# Patient Record
Sex: Female | Born: 1980
Health system: Southern US, Community
[De-identification: ages and names within clinical notes are randomized; demographics above are authoritative.]

## PROBLEM LIST (undated history)

## (undated) DIAGNOSIS — E88819 Insulin resistance, unspecified: Secondary | ICD-10-CM

## (undated) DIAGNOSIS — E2839 Other primary ovarian failure: Secondary | ICD-10-CM

## (undated) DIAGNOSIS — T7840XA Allergy, unspecified, initial encounter: Secondary | ICD-10-CM

## (undated) DIAGNOSIS — K900 Celiac disease: Secondary | ICD-10-CM

## (undated) DIAGNOSIS — E8881 Metabolic syndrome: Secondary | ICD-10-CM

## (undated) DIAGNOSIS — G4730 Sleep apnea, unspecified: Secondary | ICD-10-CM

## (undated) DIAGNOSIS — Q796 Ehlers-Danlos syndrome, unspecified: Secondary | ICD-10-CM

## (undated) DIAGNOSIS — F419 Anxiety disorder, unspecified: Secondary | ICD-10-CM

## (undated) DIAGNOSIS — F32A Depression, unspecified: Secondary | ICD-10-CM

## (undated) DIAGNOSIS — M797 Fibromyalgia: Secondary | ICD-10-CM

## (undated) DIAGNOSIS — M1990 Unspecified osteoarthritis, unspecified site: Secondary | ICD-10-CM

## (undated) DIAGNOSIS — E079 Disorder of thyroid, unspecified: Secondary | ICD-10-CM

## (undated) DIAGNOSIS — R011 Cardiac murmur, unspecified: Secondary | ICD-10-CM

## (undated) DIAGNOSIS — O039 Complete or unspecified spontaneous abortion without complication: Secondary | ICD-10-CM

## (undated) DIAGNOSIS — M533 Sacrococcygeal disorders, not elsewhere classified: Secondary | ICD-10-CM

## (undated) DIAGNOSIS — E282 Polycystic ovarian syndrome: Secondary | ICD-10-CM

## (undated) DIAGNOSIS — M50321 Other cervical disc degeneration at C4-C5 level: Secondary | ICD-10-CM

## (undated) DIAGNOSIS — R87619 Unspecified abnormal cytological findings in specimens from cervix uteri: Secondary | ICD-10-CM

## (undated) DIAGNOSIS — D689 Coagulation defect, unspecified: Secondary | ICD-10-CM

## (undated) DIAGNOSIS — J069 Acute upper respiratory infection, unspecified: Secondary | ICD-10-CM

## (undated) HISTORY — DX: Disorder of thyroid, unspecified: E07.9

## (undated) HISTORY — DX: Other cervical disc degeneration at C4-C5 level: M50.321

## (undated) HISTORY — DX: Acute upper respiratory infection, unspecified: J06.9

## (undated) HISTORY — PX: ADENOIDECTOMY: SUR15

## (undated) HISTORY — PX: FINGER SURGERY: SHX640

## (undated) HISTORY — PX: SPINE SURGERY: SHX786

## (undated) HISTORY — DX: Cardiac murmur, unspecified: R01.1

## (undated) HISTORY — DX: Celiac disease: K90.0

## (undated) HISTORY — DX: Allergy, unspecified, initial encounter: T78.40XA

## (undated) HISTORY — DX: Anxiety disorder, unspecified: F41.9

## (undated) HISTORY — DX: Unspecified abnormal cytological findings in specimens from cervix uteri: R87.619

## (undated) HISTORY — DX: Coagulation defect, unspecified: D68.9

## (undated) HISTORY — DX: Sacrococcygeal disorders, not elsewhere classified: M53.3

## (undated) HISTORY — DX: Complete or unspecified spontaneous abortion without complication: O03.9

## (undated) HISTORY — PX: WISDOM TOOTH EXTRACTION: SHX21

## (undated) HISTORY — DX: Depression, unspecified: F32.A

## (undated) HISTORY — PX: TONSILLECTOMY: SUR1361

## (undated) HISTORY — DX: Unspecified osteoarthritis, unspecified site: M19.90

## (undated) HISTORY — DX: Other primary ovarian failure: E28.39

## (undated) HISTORY — DX: Sleep apnea, unspecified: G47.30

## (undated) HISTORY — PX: COLPOSCOPY: SHX161

## (undated) HISTORY — PX: BACK SURGERY: SHX140

---

## 2007-01-31 DIAGNOSIS — M5137 Other intervertebral disc degeneration, lumbosacral region: Secondary | ICD-10-CM | POA: Insufficient documentation

## 2007-01-31 DIAGNOSIS — M51379 Other intervertebral disc degeneration, lumbosacral region without mention of lumbar back pain or lower extremity pain: Secondary | ICD-10-CM | POA: Insufficient documentation

## 2007-01-31 DIAGNOSIS — G9332 Myalgic encephalomyelitis/chronic fatigue syndrome: Secondary | ICD-10-CM | POA: Insufficient documentation

## 2016-10-21 DIAGNOSIS — E8881 Metabolic syndrome: Secondary | ICD-10-CM | POA: Insufficient documentation

## 2016-10-21 DIAGNOSIS — E88819 Insulin resistance, unspecified: Secondary | ICD-10-CM | POA: Insufficient documentation

## 2017-01-30 DIAGNOSIS — I341 Nonrheumatic mitral (valve) prolapse: Secondary | ICD-10-CM | POA: Insufficient documentation

## 2017-11-18 DIAGNOSIS — O4590 Premature separation of placenta, unspecified, unspecified trimester: Secondary | ICD-10-CM

## 2019-05-17 DIAGNOSIS — E282 Polycystic ovarian syndrome: Secondary | ICD-10-CM | POA: Insufficient documentation

## 2019-05-17 DIAGNOSIS — Q7962 Hypermobile Ehlers-Danlos syndrome: Secondary | ICD-10-CM | POA: Insufficient documentation

## 2019-05-25 ENCOUNTER — Other Ambulatory Visit: Payer: Self-pay

## 2019-05-25 ENCOUNTER — Encounter: Payer: Self-pay | Admitting: Emergency Medicine

## 2019-05-25 ENCOUNTER — Emergency Department
Admission: EM | Admit: 2019-05-25 | Discharge: 2019-05-25 | Disposition: A | Discharge status: Home / Self Care | Payer: Medicare Other | Attending: Emergency Medicine | Admitting: Emergency Medicine

## 2019-05-25 ENCOUNTER — Emergency Department: Payer: Medicare Other

## 2019-05-25 DIAGNOSIS — Z87891 Personal history of nicotine dependence: Secondary | ICD-10-CM | POA: Insufficient documentation

## 2019-05-25 DIAGNOSIS — Y939 Activity, unspecified: Secondary | ICD-10-CM | POA: Diagnosis not present

## 2019-05-25 DIAGNOSIS — S5011XA Contusion of right forearm, initial encounter: Secondary | ICD-10-CM | POA: Insufficient documentation

## 2019-05-25 DIAGNOSIS — Z5320 Procedure and treatment not carried out because of patient's decision for unspecified reasons: Secondary | ICD-10-CM | POA: Insufficient documentation

## 2019-05-25 DIAGNOSIS — Y929 Unspecified place or not applicable: Secondary | ICD-10-CM | POA: Insufficient documentation

## 2019-05-25 DIAGNOSIS — X58XXXA Exposure to other specified factors, initial encounter: Secondary | ICD-10-CM | POA: Diagnosis not present

## 2019-05-25 DIAGNOSIS — Y999 Unspecified external cause status: Secondary | ICD-10-CM | POA: Insufficient documentation

## 2019-05-25 DIAGNOSIS — S40021A Contusion of right upper arm, initial encounter: Secondary | ICD-10-CM

## 2019-05-25 DIAGNOSIS — R58 Hemorrhage, not elsewhere classified: Secondary | ICD-10-CM | POA: Insufficient documentation

## 2019-05-25 HISTORY — DX: Insulin resistance, unspecified: E88.819

## 2019-05-25 HISTORY — DX: Ehlers-Danlos syndrome, unspecified: Q79.60

## 2019-05-25 HISTORY — DX: Polycystic ovarian syndrome: E28.2

## 2019-05-25 HISTORY — DX: Metabolic syndrome: E88.81

## 2019-05-25 HISTORY — DX: Fibromyalgia: M79.7

## 2019-05-25 LAB — CBC WITH DIFFERENTIAL/PLATELET
Abs Immature Granulocytes: 0.02 10*3/uL (ref 0.00–0.07)
Basophils Absolute: 0 10*3/uL (ref 0.0–0.1)
Basophils Relative: 0 %
Eosinophils Absolute: 0.1 10*3/uL (ref 0.0–0.5)
Eosinophils Relative: 1 %
HCT: 39.4 % (ref 36.0–46.0)
Hemoglobin: 13.1 g/dL (ref 12.0–15.0)
Immature Granulocytes: 0 %
Lymphocytes Relative: 30 %
Lymphs Abs: 2.6 10*3/uL (ref 0.7–4.0)
MCH: 30.6 pg (ref 26.0–34.0)
MCHC: 33.2 g/dL (ref 30.0–36.0)
MCV: 92.1 fL (ref 80.0–100.0)
Monocytes Absolute: 0.4 10*3/uL (ref 0.1–1.0)
Monocytes Relative: 5 %
Neutro Abs: 5.4 10*3/uL (ref 1.7–7.7)
Neutrophils Relative %: 64 %
Platelets: 322 10*3/uL (ref 150–400)
RBC: 4.28 MIL/uL (ref 3.87–5.11)
RDW: 13.1 % (ref 11.5–15.5)
WBC: 8.5 10*3/uL (ref 4.0–10.5)
nRBC: 0 % (ref 0.0–0.2)

## 2019-05-25 LAB — COMPREHENSIVE METABOLIC PANEL
ALT: 17 U/L (ref 0–44)
AST: 16 U/L (ref 15–41)
Albumin: 4.2 g/dL (ref 3.5–5.0)
Alkaline Phosphatase: 53 U/L (ref 38–126)
Anion gap: 8 (ref 5–15)
BUN: 7 mg/dL (ref 6–20)
CO2: 23 mmol/L (ref 22–32)
Calcium: 9.2 mg/dL (ref 8.9–10.3)
Chloride: 106 mmol/L (ref 98–111)
Creatinine, Ser: 0.63 mg/dL (ref 0.44–1.00)
GFR calc Af Amer: >60 mL/min (ref >60)
GFR calc non Af Amer: >60 mL/min (ref >60)
Glucose, Bld: 86 mg/dL (ref 70–99)
Potassium: 3.8 mmol/L (ref 3.5–5.1)
Sodium: 137 mmol/L (ref 135–145)
Total Bilirubin: 0.7 mg/dL (ref 0.3–1.2)
Total Protein: 6.9 g/dL (ref 6.5–8.1)

## 2019-05-25 NOTE — ED Provider Notes (Signed)
[facility] Emergency Department Provider Note  ____________________________________________   First MD Initiated Contact with Patient 05/25/19 1445     (approximate)  I have reviewed the triage vital signs and the nursing notes.   HISTORY  Chief Complaint No chief complaint on file.   HPI Stacy West is a 38 y.o. female presents to the ED with concerns of her right arm bruising.  Patient is concerned for DVT although she does not have a history of blood clots in the past.  She denies any recent trauma.  Patient is a former smoker but stopped 2 years ago.  She rates her pain as a 10/10.      Past Medical History:  Diagnosis Date  . Ehlers-Danlos disease   . Fibromyalgia   . Insulin resistance   . PCOS (polycystic ovarian syndrome)     There are no active problems to display for this patient.   History reviewed. No pertinent surgical history.  Prior to Admission medications   Not on File    Allergies Carisoprodol and Percocet  [oxycodone-acetaminophen]  History reviewed. No pertinent family history.  Social History Social History   Tobacco Use  . Smoking status: Former Smoker    Types: Cigarettes    Quit date: 2018    Years since quitting: 2.5  . Smokeless tobacco: Never Used  Substance Use Topics  . Alcohol use: Yes    Frequency: Never    Comment: occ  . Drug use: Not on file    Review of Systems Constitutional: No fever/chills Cardiovascular: Denies chest pain. Respiratory: Denies shortness of breath. Musculoskeletal: Positive right arm pain. Skin: Positive right arm bruising. Neurological: Negative for headaches, focal weakness or numbness. ___________________________________________   PHYSICAL EXAM:  VITAL SIGNS: ED Triage Vitals  Enc Vitals Group     BP 05/25/19 1256 110/70     Pulse Rate 05/25/19 1239 79     Resp --      Temp 05/25/19 1239 99 F (37.2 C)     Temp Source 05/25/19 1239 Oral     SpO2  05/25/19 1239 98 %     Weight 05/25/19 1242 240 lb (108.9 kg)     Height 05/25/19 1242 5\' 11"  (1.803 m)     Head Circumference --      Peak Flow --      Pain Score 05/25/19 1241 10     Pain Loc --      Pain Edu? --      Excl. in [place]? --    Constitutional: Alert and oriented. Well appearing and in no acute distress. Eyes: Conjunctivae are normal.  Head: Atraumatic. Neck: No stridor.   Cardiovascular: Normal rate, regular rhythm. Grossly normal heart sounds.  Good peripheral circulation. Respiratory: Normal respiratory effort.  No retractions. Lungs CTAB. Musculoskeletal: On examination of the right upper extremity there is on the volar surface of the forearm and arm a resolving ecchymotic area that is linear in nature.  Patient is able to move the extremity without any assistance.  Skin is warm and dry.  Pulses present. Neurologic:  Normal speech and language. No gross focal neurologic deficits are appreciated.  Skin:  Skin is warm, dry and intact.  Psychiatric: Mood and affect are normal. Speech and behavior are normal.  ____________________________________________   LABS (all labs ordered are listed, but only abnormal results are displayed)  Labs Reviewed  COMPREHENSIVE METABOLIC PANEL  CBC WITH DIFFERENTIAL/PLATELET    RADIOLOGY   Official radiology report(s):  US Venous Img Upper Uni Right  Result Date: 05/25/2019 CLINICAL DATA:  Bruising to the right upper arm for the past day. Recent cervical spine fusion. Evaluate for DVT. EXAM: RIGHT UPPER EXTREMITY VENOUS DOPPLER ULTRASOUND TECHNIQUE: Gray-scale sonography with graded compression, as well as color Doppler and duplex ultrasound were performed to evaluate the upper extremity deep venous system from the level of the subclavian vein and including the jugular, axillary, basilic, radial, ulnar and upper cephalic vein. Spectral Doppler was utilized to evaluate flow at rest and with distal augmentation maneuvers. COMPARISON:  None.  FINDINGS: Contralateral Subclavian Vein: Respiratory phasicity is normal and symmetric with the symptomatic side. No evidence of thrombus. Normal compressibility. Internal Jugular Vein: No evidence of thrombus. Normal compressibility, respiratory phasicity and response to augmentation. Subclavian Vein: No evidence of thrombus. Normal compressibility, respiratory phasicity and response to augmentation. Axillary Vein: No evidence of thrombus. Normal compressibility, respiratory phasicity and response to augmentation. Cephalic Vein: No evidence of thrombus. Normal compressibility, respiratory phasicity and response to augmentation. Basilic Vein: No evidence of thrombus. Normal compressibility, respiratory phasicity and response to augmentation. Brachial Veins: No evidence of thrombus. Normal compressibility, respiratory phasicity and response to augmentation. Radial Veins: No evidence of thrombus. Normal compressibility, respiratory phasicity and response to augmentation. Ulnar Veins: No evidence of thrombus. Normal compressibility, respiratory phasicity and response to augmentation. Venous Reflux:  None visualized. Other Findings:  None visualized. IMPRESSION: No evidence of DVT within the right upper extremity. Electronically Signed   By: Sandi Mariscal M.D.   On: 05/25/2019 14:46    ____________________________________________   PROCEDURES  Procedure(s) performed (including Critical Care):  Procedures   ____________________________________________   INITIAL IMPRESSION / ASSESSMENT AND PLAN / ED COURSE  As part of my medical decision making, I reviewed the following data within the electronic MEDICAL RECORD NUMBER Notes from prior ED visits and [place] Controlled Substance Database  38 year old female presents to the ED with discoloration to her forearm that is concerning for a DVT.  She denies any recent trauma and she has no history of previous DVT.  She is not on any blood thinners or aspirin regimen.   Ultrasound was negative for DVT.  Lab work was ordered however patient had to leave due to the need to pick up a child and [place].  She left prior to lab results but will follow-up with her PCP.  ____________________________________________   FINAL CLINICAL IMPRESSION(S) / ED DIAGNOSES  Final diagnoses:  Ecchymosis of forearm     ED Discharge Orders    None       Note:  This document was prepared using Dragon voice recognition software and may include unintentional dictation errors.    Johnn Hai, PA-C 05/25/19 1727    Lavonia Drafts, MD 05/26/19 1106

## 2019-05-25 NOTE — ED Triage Notes (Signed)
Pt presents to ED c/o bruising to R arm, pt states she is concerned for DVT. No hx blood clots. Hx ehlers-danlos. No redness, no swelling. Denies trauma.

## 2019-05-25 NOTE — ED Notes (Signed)
See triage note  Provider in with pt on arrival to room

## 2019-05-31 DIAGNOSIS — I341 Nonrheumatic mitral (valve) prolapse: Secondary | ICD-10-CM | POA: Insufficient documentation

## 2019-06-07 ENCOUNTER — Telehealth: Payer: Self-pay

## 2019-06-07 NOTE — Telephone Encounter (Signed)
Pt calling; trying to conceive; has consultation appt/pap 8/8; sees endocrinology re PCOS; had miscarriage in April; had period already this month; showing signs of bleeding again - not sure if implantation, miscarriage again or PCOS giving her a 2nd period for the first time ever.  What to do?  [phone]  Left detailed msg that b/c we haven't see her I cannot advise her.  I can tell her to call ED or provider that took care of her in April.

## 2019-06-08 DIAGNOSIS — Z3169 Encounter for other general counseling and advice on procreation: Secondary | ICD-10-CM | POA: Insufficient documentation

## 2019-06-18 ENCOUNTER — Other Ambulatory Visit: Payer: Self-pay

## 2019-06-18 ENCOUNTER — Encounter: Payer: Self-pay | Admitting: Advanced Practice Midwife

## 2019-06-18 ENCOUNTER — Ambulatory Visit (INDEPENDENT_AMBULATORY_CARE_PROVIDER_SITE_OTHER): Payer: Medicare Other | Admitting: Advanced Practice Midwife

## 2019-06-18 ENCOUNTER — Other Ambulatory Visit (HOSPITAL_COMMUNITY)
Admission: RE | Admit: 2019-06-18 | Discharge: 2019-06-18 | Disposition: A | Discharge status: Home / Self Care | Payer: Medicare Other | Source: Ambulatory Visit | Attending: Advanced Practice Midwife | Admitting: Advanced Practice Midwife

## 2019-06-18 VITALS — BP 124/78 | Ht 71.0 in | Wt 254.0 lb

## 2019-06-18 DIAGNOSIS — Z113 Encounter for screening for infections with a predominantly sexual mode of transmission: Secondary | ICD-10-CM | POA: Diagnosis present

## 2019-06-18 DIAGNOSIS — Z01419 Encounter for gynecological examination (general) (routine) without abnormal findings: Secondary | ICD-10-CM

## 2019-06-18 DIAGNOSIS — Z124 Encounter for screening for malignant neoplasm of cervix: Secondary | ICD-10-CM

## 2019-06-18 NOTE — Patient Instructions (Signed)
Health Maintenance, Female Adopting a healthy lifestyle and getting preventive care are important in promoting health and wellness. Ask your health care provider about:  The right schedule for you to have regular tests and exams.  Things you can do on your own to prevent diseases and keep yourself healthy. What should I know about diet, weight, and exercise? Eat a healthy diet   Eat a diet that includes plenty of vegetables, fruits, low-fat dairy products, and lean protein.  Do not eat a lot of foods that are high in solid fats, added sugars, or sodium. Maintain a healthy weight Body mass index (BMI) is used to identify weight problems. It estimates body fat based on height and weight. Your health care provider can help determine your BMI and help you achieve or maintain a healthy weight. Get regular exercise Get regular exercise. This is one of the most important things you can do for your health. Most adults should:  Exercise for at least 150 minutes each week. The exercise should increase your heart rate and make you sweat (moderate-intensity exercise).  Do strengthening exercises at least twice a week. This is in addition to the moderate-intensity exercise.  Spend less time sitting. Even light physical activity can be beneficial. Watch cholesterol and blood lipids Have your blood tested for lipids and cholesterol at 36 years of age, then have this test every 5 years. Have your cholesterol levels checked more often if:  Your lipid or cholesterol levels are high.  You are older than 36 years of age.  You are at high risk for heart disease. What should I know about cancer screening? Depending on your health history and family history, you may need to have cancer screening at various ages. This may include screening for:  Breast cancer.  Cervical cancer.  Colorectal cancer.  Skin cancer.  Lung cancer. What should I know about heart disease, diabetes, and high blood  pressure? Blood pressure and heart disease  High blood pressure causes heart disease and increases the risk of stroke. This is more likely to develop in people who have high blood pressure readings, are of African descent, or are overweight.  Have your blood pressure checked: ? Every 3-5 years if you are 54-9 years of age. ? Every year if you are 69 years old or older. Diabetes Have regular diabetes screenings. This checks your fasting blood sugar level. Have the screening done:  Once every three years after age 36 if you are at a normal weight and have a low risk for diabetes.  More often and at a younger age if you are overweight or have a high risk for diabetes. What should I know about preventing infection? Hepatitis B If you have a higher risk for hepatitis B, you should be screened for this virus. Talk with your health care provider to find out if you are at risk for hepatitis B infection. Hepatitis C Testing is recommended for:  Everyone born from [birth year] through 1963.  Anyone with known risk factors for hepatitis C. Sexually transmitted infections (STIs)  Get screened for STIs, including gonorrhea and chlamydia, if: ? You are sexually active and are younger than 36 years of age. ? You are older than 36 years of age and your health care provider tells you that you are at risk for this type of infection. ? Your sexual activity has changed since you were last screened, and you are at increased risk for chlamydia or gonorrhea. Ask your health care provider  if you are at risk.  Ask your health care provider about whether you are at high risk for HIV. Your health care provider may recommend a prescription medicine to help prevent HIV infection. If you choose to take medicine to prevent HIV, you should first get tested for HIV. You should then be tested every 3 months for as long as you are taking the medicine. Pregnancy  If you are about to stop having your period (premenopausal) and  you may become pregnant, seek counseling before you get pregnant.  Take 400 to 800 micrograms (mcg) of folic acid every day if you become pregnant.  Ask for birth control (contraception) if you want to prevent pregnancy. Osteoporosis and menopause Osteoporosis is a disease in which the bones lose minerals and strength with aging. This can result in bone fractures. If you are 1 years old or older, or if you are at risk for osteoporosis and fractures, ask your health care provider if you should:  Be screened for bone loss.  Take a calcium or vitamin D supplement to lower your risk of fractures.  Be given hormone replacement therapy (HRT) to treat symptoms of menopause. Follow these instructions at home: Lifestyle  Do not use any products that contain nicotine or tobacco, such as cigarettes, e-cigarettes, and chewing tobacco. If you need help quitting, ask your health care provider.  Do not use street drugs.  Do not share needles.  Ask your health care provider for help if you need support or information about quitting drugs. Alcohol use  Do not drink alcohol if: ? Your health care provider tells you not to drink. ? You are pregnant, may be pregnant, or are planning to become pregnant.  If you drink alcohol: ? Limit how much you use to 0-1 drink a day. ? Limit intake if you are breastfeeding.  Be aware of how much alcohol is in your drink. In the [place] one drink equals one 12 oz bottle of beer (355 mL), one 5 oz glass of wine (148 mL), or one 1 oz glass of hard liquor (44 mL). General instructions  Schedule regular health, dental, and eye exams.  Stay current with your vaccines.  Tell your health care provider if: ? You often feel depressed. ? You have ever been abused or do not feel safe at home. Summary  Adopting a healthy lifestyle and getting preventive care are important in promoting health and wellness.  Follow your health care provider's instructions about healthy  diet, exercising, and getting tested or screened for diseases.  Follow your health care provider's instructions on monitoring your cholesterol and blood pressure. This information is not intended to replace advice given to you by your health care provider. Make sure you discuss any questions you have with your health care provider. Document Released: 05/17/2011 Document Revised: 10/25/2018 Document Reviewed: 10/25/2018 Elsevier Patient Education  2020 [employer].     Why follow it? Research shows. . Those who follow the Mediterranean diet have a reduced risk of heart disease  . The diet is associated with a reduced incidence of Parkinson's and Alzheimer's diseases . People following the diet may have longer life expectancies and lower rates of chronic diseases  . The Dietary Guidelines for Americans recommends the Mediterranean diet as an eating plan to promote health and prevent disease  What Is the Mediterranean Diet?  . Healthy eating plan based on typical foods and recipes of Mediterranean-style cooking . The diet is primarily a plant based diet; these  foods should make up a majority of meals   Starches - Plant based foods should make up a majority of meals - They are an important sources of vitamins, minerals, energy, antioxidants, and fiber - Choose whole grains, foods high in fiber and minimally processed items  - Typical grain sources include wheat, oats, barley, corn, brown rice, bulgar, farro, millet, polenta, couscous  - Various types of beans include chickpeas, lentils, fava beans, black beans, white beans   Fruits  Veggies - Large quantities of antioxidant rich fruits & veggies; 6 or more servings  - Vegetables can be eaten raw or lightly drizzled with oil and cooked  - Vegetables common to the traditional Mediterranean Diet include: artichokes, arugula, beets, broccoli, brussel sprouts, cabbage, carrots, celery, collard greens, cucumbers, eggplant, kale, leeks, lemons,  lettuce, mushrooms, okra, onions, peas, peppers, potatoes, pumpkin, radishes, rutabaga, shallots, spinach, sweet potatoes, turnips, zucchini - Fruits common to the Mediterranean Diet include: apples, apricots, avocados, cherries, clementines, dates, figs, grapefruits, grapes, melons, nectarines, oranges, peaches, pears, pomegranates, strawberries, tangerines  Fats - Replace butter and margarine with healthy oils, such as olive oil, canola oil, and tahini  - Limit nuts to no more than a handful a day  - Nuts include walnuts, almonds, pecans, pistachios, pine nuts  - Limit or avoid candied, honey roasted or heavily salted nuts - Olives are central to the [employer] - can be eaten whole or used in a variety of dishes   Meats Protein - Limiting red meat: no more than a few times a month - When eating red meat: choose lean cuts and keep the portion to the size of deck of cards - Eggs: approx. 0 to 4 times a week  - Fish and lean poultry: at least 2 a week  - Healthy protein sources include, chicken, [place], lean beef, lamb - Increase intake of seafood such as tuna, salmon, trout, mackerel, shrimp, scallops - Avoid or limit high fat processed meats such as sausage and bacon  Dairy - Include moderate amounts of low fat dairy products  - Focus on healthy dairy such as fat free yogurt, skim milk, low or reduced fat cheese - Limit dairy products higher in fat such as whole or 2% milk, cheese, ice cream  Alcohol - Moderate amounts of red wine is ok  - No more than 5 oz daily for women (all ages) and men older than age 73  - No more than 10 oz of wine daily for men younger than 35  Other - Limit sweets and other desserts  - Use herbs and spices instead of salt to flavor foods  - Herbs and spices common to the traditional Mediterranean Diet include: basil, bay leaves, chives, cloves, cumin, fennel, garlic, lavender, marjoram, mint, oregano, parsley, pepper, rosemary, sage, savory, sumac, tarragon,  thyme   It's not just a diet, it's a lifestyle:  . The Mediterranean diet includes lifestyle factors typical of those in the region  . Foods, drinks and meals are best eaten with others and savored . Daily physical activity is important for overall good health . This could be strenuous exercise like running and aerobics . This could also be more leisurely activities such as walking, housework, yard-work, or taking the stairs . Moderation is the key; a balanced and healthy diet accommodates most foods and drinks . Consider portion sizes and frequency of consumption of certain foods   Meal Ideas & Options:  . Breakfast:  o Whole wheat toast or whole  wheat English muffins with peanut butter & hard boiled egg o Steel cut oats topped with apples & cinnamon and skim milk  o Fresh fruit: banana, strawberries, melon, berries, peaches  o Smoothies: strawberries, bananas, greek yogurt, peanut butter o Low fat greek yogurt with blueberries and granola  o Egg white omelet with spinach and mushrooms o Breakfast couscous: whole wheat couscous, apricots, skim milk, cranberries  . Sandwiches:  o Hummus and grilled vegetables (peppers, zucchini, squash) on whole wheat bread   o Grilled chicken on whole wheat pita with lettuce, tomatoes, cucumbers or tzatziki  o Tuna salad on whole wheat bread: tuna salad made with greek yogurt, olives, red peppers, capers, green onions o Garlic rosemary lamb pita: lamb sauted with garlic, rosemary, salt & pepper; add lettuce, cucumber, greek yogurt to pita - flavor with lemon juice and black pepper  . Seafood:  o Mediterranean grilled salmon, seasoned with garlic, basil, parsley, lemon juice and black pepper o Shrimp, lemon, and spinach whole-grain pasta salad made with low fat greek yogurt  o Seared scallops with lemon orzo  o Seared tuna steaks seasoned salt, pepper, coriander topped with tomato mixture of olives, tomatoes, olive oil, minced garlic, parsley, green  onions and cappers  . Meats:  o Herbed greek chicken salad with kalamata olives, cucumber, feta  o Red bell peppers stuffed with spinach, bulgur, lean ground beef (or lentils) & topped with feta   o Kebabs: skewers of chicken, tomatoes, onions, zucchini, squash  o [place] burgers: made with red onions, mint, dill, lemon juice, feta cheese topped with roasted red peppers . Vegetarian o Cucumber salad: cucumbers, artichoke hearts, celery, red onion, feta cheese, tossed in olive oil & lemon juice  o Hummus and whole grain pita points with a greek salad (lettuce, tomato, feta, olives, cucumbers, red onion) o Lentil soup with celery, carrots made with vegetable broth, garlic, salt and pepper  o Tabouli salad: parsley, bulgur, mint, scallions, cucumbers, tomato, radishes, lemon juice, olive oil, salt and pepper.      American Heart Association (AHA) Exercise Recommendation  Being physically active is important to prevent heart disease and stroke, the nation's No. 1and No. 5killers. To improve overall cardiovascular health, we suggest at least 150 minutes per week of moderate exercise or 75 minutes per week of vigorous exercise (or a combination of moderate and vigorous activity). Thirty minutes a day, five times a week is an easy goal to remember. You will also experience benefits even if you divide your time into two or three segments of 10 to 15 minutes per day.  For people who would benefit from lowering their blood pressure or cholesterol, we recommend 40 minutes of aerobic exercise of moderate to vigorous intensity three to four times a week to lower the risk for heart attack and stroke.  Physical activity is anything that makes you move your body and burn calories.  This includes things like climbing stairs or playing sports. Aerobic exercises benefit your heart, and include walking, jogging, swimming or biking. Strength and stretching exercises are best for overall stamina and flexibility.  The  simplest, positive change you can make to effectively improve your heart health is to start walking. It's enjoyable, free, easy, social and great exercise. A walking program is flexible and boasts high success rates because people can stick with it. It's easy for walking to become a regular and satisfying part of life.   For Overall Cardiovascular Health:  At least 30 minutes of moderate-intensity aerobic  activity at least 5 days per week for a total of 150  OR   At least 25 minutes of vigorous aerobic activity at least 3 days per week for a total of 75 minutes; or a combination of moderate- and vigorous-intensity aerobic activity  AND   Moderate- to high-intensity muscle-strengthening activity at least 2 days per week for additional health benefits.  For Lowering Blood Pressure and Cholesterol  An average 40 minutes of moderate- to vigorous-intensity aerobic activity 3 or 4 times per week  What if I can't make it to the time goal? Something is always better than nothing! And everyone has to start somewhere. Even if you've been sedentary for years, today is the day you can begin to make healthy changes in your life. If you don't think you'll make it for 30 or 40 minutes, set a reachable goal for today. You can work up toward your overall goal by increasing your time as you get stronger. Don't let all-or-nothing thinking rob you of doing what you can every day.  Source:http://www.heart.org

## 2019-06-19 LAB — HEPATITIS B SURFACE ANTIBODY,QUALITATIVE: Hep B Surface Ab, Qual: NONREACTIVE

## 2019-06-19 LAB — HIV ANTIBODY (ROUTINE TESTING W REFLEX): HIV Screen 4th Generation wRfx: NONREACTIVE

## 2019-06-19 LAB — RPR QUALITATIVE: RPR Ser Ql: NONREACTIVE

## 2019-06-20 ENCOUNTER — Encounter: Payer: Self-pay | Admitting: Advanced Practice Midwife

## 2019-06-20 LAB — CYTOLOGY - PAP
Chlamydia: NEGATIVE
Diagnosis: NEGATIVE
Neisseria Gonorrhea: NEGATIVE
Trichomonas: NEGATIVE

## 2019-06-20 NOTE — Progress Notes (Signed)
Gynecology Annual Exam   PCP: [name], DO  Chief Complaint:  Chief Complaint  Patient presents with  . Annual Exam    History of Present Illness: Patient is a 38 y.o. G2P1011 presents for annual exam. The patient has no gyn complaints today other than she is attempting to conceive.   She has a complex past medical history that includes extensive spinal disease secondary to history of Ehlers-Danlos syndrome, and she recently had a spinal fusion of the neck. She has been establishing care with orthopedics for further evaluation and treatment of ongoing neck pain. In addition she has established care with cardiology regarding her Ehlers-Danlos. She had the surgery in [place], but has recently moved to our area and needs establishment of care in this area. Her previous pregnancy was high risk due to her Ehlers-Danlos syndrome.  A recent Echocardiogram noted mild thickening of her mitral valve with trace regurgitation. Otherwise normal function. Cardiology recommended moderate exercise without intense bursts. There is no mention of restrictions in regards to pregnancy.   The patient states she needs to have a sacroiliac fusion surgery in the near future and is wondering if that will mean she will have to have a c/section for a future pregnancy.   LMP: Patient's last menstrual period was 06/08/2019. Average Interval: regular, 28 days, most recently had 2 cycles in 1 month Duration of flow: 4 days Heavy Menses: yes Clots: no Intermenstrual Bleeding: no Postcoital Bleeding: no Dysmenorrhea: no  The patient is sexually active. She currently uses none for contraception. She denies dyspareunia.  The patient does occasionally perform self breast exams.  There is no notable family history of breast or ovarian cancer in her family. Her mother's sister had breast cancer at age 76.  The patient wears seatbelts: yes.   The patient has regular exercise: She walks and is active with a  toddler. She is on Keto diet and says she lost 30 pounds recently. She admits adequate hydration. She admits about 4 hours sleep per night.    The patient denies current symptoms of depression.  She was seen a couple weeks ago at [facility] with diagnosis of generalized anxiety disorder. A referral was sent to Psychology. She declined medication at that visit.  Review of Systems: Review of Systems  Constitutional: Negative.   HENT: Negative.   Eyes: Negative.   Respiratory: Negative.   Cardiovascular: Negative.   Gastrointestinal: Negative.   Genitourinary: Negative.   Musculoskeletal: Positive for joint pain and neck pain.  Skin: Negative.   Neurological: Negative.   Endo/Heme/Allergies: Negative.   Psychiatric/Behavioral: Negative.     Past Medical History:  Past Medical History:  Diagnosis Date  . Abnormal Pap smear of cervix   . Ehlers-Danlos disease   . Fibromyalgia   . Heart murmur   . Insulin resistance   . Miscarriage   . PCOS (polycystic ovarian syndrome)   . PCOS (polycystic ovarian syndrome)     Past Surgical History:  Past Surgical History:  Procedure Laterality Date  . BACK SURGERY    . COLPOSCOPY      Gynecologic History:  Patient's last menstrual period was 06/08/2019. Contraception: none Last Pap: per patient report: 2017 (abnormal), 2019 (normal) in [place].  Obstetric History: G2P1011  Family History:  Family History  Problem Relation Age of Onset  . Heart murmur Mother   . Skin cancer Father   . Heart murmur Maternal Aunt   . Breast cancer Maternal Aunt  Social History:  Social History   Socioeconomic History  . Marital status: Married    Spouse name: Not on file  . Number of children: Not on file  . Years of education: Not on file  . Highest education level: Not on file  Occupational History  . Not on file  Social Needs  . Financial resource strain: Not on file  . Food insecurity    Worry: Not on file    Inability:  Not on file  . Transportation needs    Medical: Not on file    Non-medical: Not on file  Tobacco Use  . Smoking status: Former Smoker    Types: Cigarettes    Quit date: 2018    Years since quitting: 2.5  . Smokeless tobacco: Never Used  Substance and Sexual Activity  . Alcohol use: Yes    Frequency: Never    Comment: occ  . Drug use: Not Currently  . Sexual activity: Yes    Birth control/protection: None  Lifestyle  . Physical activity    Days per week: Not on file    Minutes per session: Not on file  . Stress: Not on file  Relationships  . Social Herbalist on phone: Not on file    Gets together: Not on file    Attends religious service: Not on file    Active member of club or organization: Not on file    Attends meetings of clubs or organizations: Not on file    Relationship status: Not on file  . Intimate partner violence    Fear of current or ex partner: Not on file    Emotionally abused: Not on file    Physically abused: Not on file    Forced sexual activity: Not on file  Other Topics Concern  . Not on file  Social History Narrative  . Not on file    Allergies:  Allergies  Allergen Reactions  . Carisoprodol Dermatitis  . Percocet  [Oxycodone-Acetaminophen] Dermatitis    Medications: Prior to Admission medications   Medication Sig Start Date End Date Taking? Authorizing Provider  calcium-vitamin D 250-100 MG-UNIT tablet Take by mouth.   Yes [provider]  cyclobenzaprine (FLEXERIL) 5 MG tablet Take by mouth. 05/23/19  Yes [provider]  metFORMIN (GLUCOPHAGE) 500 MG tablet Take by mouth.   Yes [provider]  omeprazole (PRILOSEC) 20 MG capsule Take by mouth. 05/29/19  Yes [provider]  Prenatal Vit-Fe Fumarate-FA (PRENATAL VITAMIN) 27-0.8 MG TABS Take by mouth.    [provider]    Physical Exam Vitals: Blood pressure 124/78, height 5\' 11"  (1.803 m), weight 254 lb (115.2 kg), last menstrual  period 06/08/2019.  General: NAD HEENT: normocephalic, anicteric Thyroid: no enlargement, no palpable nodules Pulmonary: No increased work of breathing, CTAB Cardiovascular: RRR, distal pulses 2+ Breast: Breast symmetrical, no tenderness, no palpable nodules or masses, no skin or nipple retraction present, no nipple discharge.  No axillary or supraclavicular lymphadenopathy. Abdomen: NABS, soft, non-tender, non-distended.  Umbilicus without lesions.  No hepatomegaly, splenomegaly or masses palpable. No evidence of hernia  Genitourinary:  External: Normal external female genitalia.  Normal urethral meatus, normal Bartholin's and Skene's glands.    Vagina: Normal vaginal mucosa, no evidence of prolapse.    Cervix: Grossly normal in appearance, no bleeding  Uterus: Non-enlarged, mobile, normal contour.  No CMT  Adnexa: ovaries non-enlarged, no adnexal masses  Rectal: deferred  Lymphatic: no evidence of inguinal lymphadenopathy Extremities:  no edema, erythema, or tenderness Neurologic: Grossly intact Psychiatric: mood appropriate, affect full   Assessment: 38 y.o. G3P1011 routine annual exam  Plan: Problem List Items Addressed This Visit    None    Visit Diagnoses    Well woman exam with routine gynecological exam    -  Primary   Relevant Orders   Hepatitis B surface antibody,qualitative (Completed)   HIV Antibody (routine testing w rflx) (Completed)   RPR Qual (Completed)   Cytology - PAP   Screen for sexually transmitted diseases       Relevant Orders   Hepatitis B surface antibody,qualitative (Completed)   HIV Antibody (routine testing w rflx) (Completed)   RPR Qual (Completed)   Cytology - PAP   Cervical cancer screening       Relevant Orders   Cytology - PAP      1) STI screening  was offered and accepted  2)  ASCCP guidelines and rationale discussed.  Patient opts for every 3 years screening interval  3) Contraception - the patient is currently using  none.  She  is attempting to conceive in the near future  4) Routine healthcare maintenance including cholesterol, diabetes screening discussed Declines  5) Return in 1 year (on 06/17/2020) for annual established gyn.   Rod Can, [facility] 06/20/2019, 10:48 AM

## 2019-07-04 DIAGNOSIS — M47812 Spondylosis without myelopathy or radiculopathy, cervical region: Secondary | ICD-10-CM | POA: Insufficient documentation

## 2019-07-04 DIAGNOSIS — M461 Sacroiliitis, not elsewhere classified: Secondary | ICD-10-CM | POA: Insufficient documentation

## 2019-07-26 DIAGNOSIS — F4310 Post-traumatic stress disorder, unspecified: Secondary | ICD-10-CM | POA: Diagnosis not present

## 2019-08-07 DIAGNOSIS — G4489 Other headache syndrome: Secondary | ICD-10-CM | POA: Diagnosis not present

## 2019-08-07 DIAGNOSIS — Z713 Dietary counseling and surveillance: Secondary | ICD-10-CM | POA: Diagnosis not present

## 2019-08-07 DIAGNOSIS — M47812 Spondylosis without myelopathy or radiculopathy, cervical region: Secondary | ICD-10-CM | POA: Diagnosis not present

## 2019-08-07 DIAGNOSIS — R140 Abdominal distension (gaseous): Secondary | ICD-10-CM | POA: Diagnosis not present

## 2019-08-07 DIAGNOSIS — Z6833 Body mass index (BMI) 33.0-33.9, adult: Secondary | ICD-10-CM | POA: Diagnosis not present

## 2019-08-10 DIAGNOSIS — M542 Cervicalgia: Secondary | ICD-10-CM | POA: Diagnosis not present

## 2019-08-14 ENCOUNTER — Ambulatory Visit (INDEPENDENT_AMBULATORY_CARE_PROVIDER_SITE_OTHER): Payer: Medicare HMO | Admitting: Obstetrics and Gynecology

## 2019-08-14 ENCOUNTER — Encounter: Payer: Self-pay | Admitting: Obstetrics and Gynecology

## 2019-08-14 ENCOUNTER — Other Ambulatory Visit: Payer: Self-pay

## 2019-08-14 ENCOUNTER — Ambulatory Visit (INDEPENDENT_AMBULATORY_CARE_PROVIDER_SITE_OTHER): Payer: Medicare HMO

## 2019-08-14 VITALS — BP 114/70 | Ht 71.0 in | Wt 243.0 lb

## 2019-08-14 DIAGNOSIS — Q7962 Hypermobile Ehlers-Danlos syndrome: Secondary | ICD-10-CM

## 2019-08-14 DIAGNOSIS — N83201 Unspecified ovarian cyst, right side: Secondary | ICD-10-CM | POA: Diagnosis not present

## 2019-08-14 DIAGNOSIS — R102 Pelvic and perineal pain: Secondary | ICD-10-CM | POA: Diagnosis not present

## 2019-08-14 DIAGNOSIS — R140 Abdominal distension (gaseous): Secondary | ICD-10-CM

## 2019-08-14 NOTE — Progress Notes (Signed)
Stacy Glaze, DO   Chief Complaint  Patient presents with  . Pelvic Pain    no uti sx, pain is right above pubic pone per pt x 1-2 weeks    HPI:      [name] is a 38 y.o. G2P1011 who LMP was Patient's last menstrual period was 08/01/2019 (exact date)., presents today for pelvic pain for a couple wks. Pain is daily, intermittent, crampy and sharp, feels like "sandpaper" above pubic bone. Also with significant bloating without n/v or constipation. Sx last 3-5 min. No aggrav factors. Had same sx in past but for never this long. No vag or urin sx. Was started on metformin and OCPs by endocrine last month for hirsutism/PCOS sx (had pearl necklace on ovaries on u/s in past), but told to stop in case cause of sx. No sx change after med cessation. Was having diarrhea with metformin, taking imodium without relief. Diarrhea sx improved off metformin. Also tried NSAIDs/heating pad without relief.  Pt is sex active, noticing dyspareunia the past 2 wks. Also sometimes feels like she is tearing vaginally and has bleeding with sex. Uses lubricants without relief. Recent neg STD testing 8/20.  Pt concerned about endometriosis due to severe dysmen in past. Dysmen improved since having a child. Menses usually monthly, last 4-5 days, tolerable dysmen.  Pt with complicated hx of Ehlers-Danlos syndrome. Seeing GI tomorrow.   Annual with Rod Can 8/20. Would like to conceive in near future.  Past Medical History:  Diagnosis Date  . Abnormal Pap smear of cervix   . Ehlers-Danlos disease   . Fibromyalgia   . Heart murmur   . Insulin resistance   . Miscarriage   . PCOS (polycystic ovarian syndrome)   . PCOS (polycystic ovarian syndrome)     Past Surgical History:  Procedure Laterality Date  . BACK SURGERY    . COLPOSCOPY      Family History  Problem Relation Age of Onset  . Heart murmur Mother   . Skin cancer Father   . Heart murmur Maternal Aunt   . Breast cancer Maternal  Aunt     Social History   Socioeconomic History  . Marital status: Married    Spouse name: Not on file  . Number of children: Not on file  . Years of education: Not on file  . Highest education level: Not on file  Occupational History  . Not on file  Social Needs  . Financial resource strain: Not on file  . Food insecurity    Worry: Not on file    Inability: Not on file  . Transportation needs    Medical: Not on file    Non-medical: Not on file  Tobacco Use  . Smoking status: Former Smoker    Types: Cigarettes    Quit date: 2018    Years since quitting: 2.7  . Smokeless tobacco: Never Used  Substance and Sexual Activity  . Alcohol use: Yes    Frequency: Never    Comment: occ  . Drug use: Not Currently  . Sexual activity: Yes    Birth control/protection: None  Lifestyle  . Physical activity    Days per week: Not on file    Minutes per session: Not on file  . Stress: Not on file  Relationships  . Social Herbalist on phone: Not on file    Gets together: Not on file    Attends religious service: Not on file  Active member of club or organization: Not on file    Attends meetings of clubs or organizations: Not on file    Relationship status: Not on file  . Intimate partner violence    Fear of current or ex partner: Not on file    Emotionally abused: Not on file    Physically abused: Not on file    Forced sexual activity: Not on file  Other Topics Concern  . Not on file  Social History Narrative  . Not on file    Outpatient Medications Prior to Visit  Medication Sig Dispense Refill  . calcium-vitamin D 250-100 MG-UNIT tablet Take by mouth.    . cyclobenzaprine (FLEXERIL) 5 MG tablet Take by mouth.    . meloxicam (MOBIC) 15 MG tablet     . metFORMIN (GLUCOPHAGE) 500 MG tablet Take by mouth.    . pregabalin (LYRICA) 50 MG capsule Take by mouth.    . spironolactone (ALDACTONE) 50 MG tablet Take by mouth.    Marland Kitchen tiZANidine (ZANAFLEX) 4 MG tablet TAKE  1 TABLET BY MOUTH EVERY 8 HOURS AS NEEDED    . Norgestimate-Ethinyl Estradiol Triphasic 0.18/0.215/0.25 MG-25 MCG tab Take by mouth.    Marland Kitchen omeprazole (PRILOSEC) 20 MG capsule Take by mouth.    . Prenatal Vit-Fe Fumarate-FA (PRENATAL VITAMIN) 27-0.8 MG TABS Take by mouth.     No facility-administered medications prior to visit.       ROS:  Review of Systems  Constitutional: Positive for fatigue. Negative for fever and unexpected weight change.  Respiratory: Negative for cough, shortness of breath and wheezing.   Cardiovascular: Negative for chest pain, palpitations and leg swelling.  Gastrointestinal: Positive for diarrhea. Negative for blood in stool, constipation, nausea and vomiting.  Endocrine: Negative for cold intolerance, heat intolerance and polyuria.  Genitourinary: Positive for dyspareunia and pelvic pain. Negative for dysuria, flank pain, frequency, genital sores, hematuria, menstrual problem, urgency, vaginal bleeding, vaginal discharge and vaginal pain.  Musculoskeletal: Negative for back pain, joint swelling and myalgias.  Skin: Negative for rash.  Neurological: Negative for dizziness, syncope, light-headedness, numbness and headaches.  Hematological: Negative for adenopathy.  Psychiatric/Behavioral: Negative for agitation, confusion, sleep disturbance and suicidal ideas. The patient is not nervous/anxious.   BREAST: No symptoms   OBJECTIVE:   Vitals:  BP 114/70   Ht 5\' 11"  (1.803 m)   Wt 243 lb (110.2 kg)   LMP 08/01/2019 (Exact Date)   BMI 33.89 kg/m   Physical Exam Vitals signs reviewed.  Constitutional:      Appearance: She is well-developed.  Neck:     Musculoskeletal: Normal range of motion.  Pulmonary:     Effort: Pulmonary effort is normal.  Abdominal:     Palpations: Abdomen is soft.     Tenderness: There is abdominal tenderness in the suprapubic area. There is no guarding or rebound.  Genitourinary:    General: Normal vulva.     Pubic Area: No  rash.      Labia:        Right: No rash, tenderness or lesion.        Left: No rash, tenderness or lesion.      Vagina: Normal. No vaginal discharge, erythema or tenderness.     Cervix: Normal.     Uterus: Normal. Tender. Not enlarged.      Adnexa: Right adnexa normal and left adnexa normal.       Right: No mass or tenderness.         Left:  No mass or tenderness.    Musculoskeletal: Normal range of motion.  Skin:    General: Skin is warm and dry.  Neurological:     General: No focal deficit present.     Mental Status: She is alert and oriented to person, place, and time.  Psychiatric:        Mood and Affect: Mood normal.        Behavior: Behavior normal.        Thought Content: Thought content normal.        Judgment: Judgment normal.     Results:  ULTRASOUND REPORT  Location: [facility]  Date of Service: 08/14/2019    Indications:Pelvic Pain Findings:  The uterus is anteverted and measures 7.4 x 4.4 x 3.3 cm. Echo texture is homogenous without evidence of focal masses. The Endometrium measures 6.4 mm.  Right Ovary measures 4.2 x 3.5 x 3.1 cm. It is not normal in appearance. There is a simple cyst in the right ovary measuring 33 x 25 x 31 mm.   Left Ovary measures 3.0 x 2.4 x 1.5 cm. It is normal in appearance. Survey of the adnexa demonstrates no adnexal masses. There is no free fluid in the cul de sac.  Impression: 1. Normal appearing uterus and cervix.  2. Normal appearing endometrium.  3. There is a 3.3 cm simple cyst in the right ovary.  4. Normal left ovary.  5. There is normal blood flow in both ovaries.  Recommendations: 1.Clinical correlation with the patient's History and Physical Exam.  Gweneth Dimitri, RT   Review of ULTRASOUND.    I have personally reviewed images and report of recent ultrasound done at [facility].    Plan of management to be discussed with pa Gilliam Hawkes  Barnett Applebaum, MD, [facility], [facility] 08/15/2019  9:38 AM  Assessment/Plan: Pelvic pain - Plan: US PELVIS TRANSVAGINAL NON-OB (TV ONLY); Tender on exam. GYN u/s shows RTO simple cyst. Could be cause of sx vs incidental finding. Pt also with bloating so could be GI related. Has appt 08/15/19 with GI.   Right ovarian cyst--should resolve on its own. No further imaging needed. NSAIDs/heating pad.  Bloating--f/u with GI.   Ehlers-Danlos syndrome Type III    Return if symptoms worsen or fail to improve.  Lola Czerwonka B. Ecko Beasley, PA-C 08/15/2019 10:58 AM

## 2019-08-14 NOTE — Patient Instructions (Signed)
I value your feedback and entrusting us with your care. If you get a Plain patient survey, I would appreciate you taking the time to let us know about your experience today. Thank you! 

## 2019-08-15 ENCOUNTER — Encounter: Payer: Self-pay | Admitting: Obstetrics and Gynecology

## 2019-08-15 DIAGNOSIS — N83201 Unspecified ovarian cyst, right side: Secondary | ICD-10-CM | POA: Insufficient documentation

## 2019-08-15 DIAGNOSIS — R102 Pelvic and perineal pain: Secondary | ICD-10-CM | POA: Diagnosis not present

## 2019-08-15 DIAGNOSIS — R6889 Other general symptoms and signs: Secondary | ICD-10-CM | POA: Diagnosis not present

## 2019-08-15 DIAGNOSIS — K580 Irritable bowel syndrome with diarrhea: Secondary | ICD-10-CM | POA: Diagnosis not present

## 2019-08-15 DIAGNOSIS — K219 Gastro-esophageal reflux disease without esophagitis: Secondary | ICD-10-CM | POA: Diagnosis not present

## 2019-08-23 DIAGNOSIS — F603 Borderline personality disorder: Secondary | ICD-10-CM | POA: Diagnosis not present

## 2019-09-20 DIAGNOSIS — R1030 Lower abdominal pain, unspecified: Secondary | ICD-10-CM | POA: Diagnosis not present

## 2019-11-02 DIAGNOSIS — N926 Irregular menstruation, unspecified: Secondary | ICD-10-CM | POA: Diagnosis not present

## 2019-11-02 DIAGNOSIS — R109 Unspecified abdominal pain: Secondary | ICD-10-CM | POA: Diagnosis not present

## 2019-12-13 DIAGNOSIS — K580 Irritable bowel syndrome with diarrhea: Secondary | ICD-10-CM | POA: Diagnosis not present

## 2019-12-13 DIAGNOSIS — R109 Unspecified abdominal pain: Secondary | ICD-10-CM | POA: Diagnosis not present

## 2019-12-18 DIAGNOSIS — M533 Sacrococcygeal disorders, not elsewhere classified: Secondary | ICD-10-CM | POA: Diagnosis not present

## 2019-12-18 DIAGNOSIS — G8929 Other chronic pain: Secondary | ICD-10-CM | POA: Diagnosis not present

## 2019-12-18 DIAGNOSIS — M532X8 Spinal instabilities, sacral and sacrococcygeal region: Secondary | ICD-10-CM | POA: Diagnosis not present

## 2019-12-19 DIAGNOSIS — R109 Unspecified abdominal pain: Secondary | ICD-10-CM | POA: Diagnosis not present

## 2019-12-19 DIAGNOSIS — K580 Irritable bowel syndrome with diarrhea: Secondary | ICD-10-CM | POA: Diagnosis not present

## 2019-12-29 DIAGNOSIS — Z01818 Encounter for other preprocedural examination: Secondary | ICD-10-CM | POA: Diagnosis not present

## 2020-01-09 ENCOUNTER — Encounter: Payer: Medicare HMO | Admitting: Advanced Practice Midwife

## 2020-01-14 ENCOUNTER — Other Ambulatory Visit: Payer: Self-pay | Admitting: Family Medicine

## 2020-01-14 DIAGNOSIS — Z369 Encounter for antenatal screening, unspecified: Secondary | ICD-10-CM

## 2020-01-17 ENCOUNTER — Other Ambulatory Visit: Payer: Self-pay

## 2020-01-17 ENCOUNTER — Ambulatory Visit
Admission: RE | Admit: 2020-01-17 | Discharge: 2020-01-17 | Disposition: A | Discharge status: Home / Self Care | Payer: Medicare HMO | Source: Ambulatory Visit | Attending: Maternal and Fetal Medicine | Admitting: Maternal and Fetal Medicine

## 2020-01-17 ENCOUNTER — Other Ambulatory Visit: Payer: Self-pay | Admitting: Family Medicine

## 2020-01-17 DIAGNOSIS — Z3491 Encounter for supervision of normal pregnancy, unspecified, first trimester: Secondary | ICD-10-CM

## 2020-01-17 DIAGNOSIS — Z369 Encounter for antenatal screening, unspecified: Secondary | ICD-10-CM | POA: Diagnosis not present

## 2020-01-17 NOTE — Consult Note (Signed)
Stacy West   Chief Complaint: EDS and pregnant  HPI: [name] is a 39 y.o. G4P1011 at Unknown gestational age ([place] from today with only gestational sac) who [place]  for self-referral. This pregnancy with ovulation predictor kit positive 1/22, believes conception occurred 1/27.  EDS: patient has had prior MFM consult at [facility] with discussion of type 3 EDS, no genetic diagnosis possible. She does not have vascular EDS and has had an echo which was normal other than mild MR. She has had 2 cervical spine fusions for her EDS and has hip pain and was planning SI joint fusion when discovered she was pregnant.  Past Medical History: Patient  has a past medical history of Abnormal Pap smear of cervix, Ehlers-Danlos disease, Fibromyalgia, Heart murmur, Insulin resistance, Miscarriage, PCOS (polycystic ovarian syndrome), and PCOS (polycystic ovarian syndrome). She also reports a new diagnosis of celiac disease and possible mast cell activation.  Past Surgical History: She  has a past surgical history that includes Back surgery; Colposcopy; and Finger surgery (Left). Tonsils and wisdom teeth Obstetric History:  OB History    Gravida  4   Para  1   Term  1   Preterm      AB  1   Living  1     SAB  1   TAB      Ectopic      Multiple      Live Births  1          OB History: 2019 40w SVD via hypnobirth with retained placenta requiring manual removal. She reports daily heavy bleeding for 6 months following delivery. [facility] in [place] April SAB  Medications: Metformin 2049m daily for PCOS, PNV   Allergies: Patient is allergic to carisoprodol and percocet  [oxycodone-acetaminophen]. She reports redness and itching  Social History: Prior tobacco  Family History: family history includes Alcoholism in her brother; Asthma in her mother; Bipolar disorder in her brother; Breast cancer in her maternal aunt; COPD in her  father; Depression in her brother and brother; Heart murmur in her maternal aunt and mother; Skin cancer in her father. Father with DVT   Asessement/Plan:  1. Unclear if viable pregnancy: re-scan in 2 weeks to assess viability 2. Type 3 EDS: she is aware of theoretical risk of cervical insufficiency and preterm birth. She had a prior full term delivery. We discussed joint laxity and she previously experienced. We also discussed risk of postpartum hemorrhage. She had wondered about having a home birth but I advised against. 3. Prior retained placenta: we discussed the risk of recurrence as well as PPH. Will need careful evaluation of placental placement at mid-trimester exam 4. PCOS: reports all normal A1cs. We discussed that we would check this again as well as an early glucola. She may be able to stop metformin if all wnl. 5. AMA: she is interested in any available testing for AMA. We briefly discussed the options of [facility] vs [facility] or am[facility]. She is leaning toward [facility] but will re-visit once we know if pregnancy is viable.  Has not established prenatal care. [facility] We will make a lab appointment in the coming weeks and then a NOB intake once viable pregnancy established. I spent 40 minutes with the patient >50% in direct care or coordination.  HCoralie Keens MD

## 2020-01-28 ENCOUNTER — Other Ambulatory Visit: Payer: Self-pay | Admitting: Maternal & Fetal Medicine

## 2020-01-28 DIAGNOSIS — O09521 Supervision of elderly multigravida, first trimester: Secondary | ICD-10-CM

## 2020-01-30 ENCOUNTER — Emergency Department
Admission: EM | Admit: 2020-01-30 | Discharge: 2020-01-30 | Disposition: A | Discharge status: Home / Self Care | Payer: Medicare HMO | Attending: Emergency Medicine | Admitting: Emergency Medicine

## 2020-01-30 ENCOUNTER — Emergency Department: Payer: Medicare HMO

## 2020-01-30 ENCOUNTER — Encounter: Payer: Self-pay | Admitting: Emergency Medicine

## 2020-01-30 ENCOUNTER — Other Ambulatory Visit: Payer: Self-pay

## 2020-01-30 DIAGNOSIS — N939 Abnormal uterine and vaginal bleeding, unspecified: Secondary | ICD-10-CM

## 2020-01-30 DIAGNOSIS — O209 Hemorrhage in early pregnancy, unspecified: Secondary | ICD-10-CM | POA: Diagnosis not present

## 2020-01-30 DIAGNOSIS — Z3A01 Less than 8 weeks gestation of pregnancy: Secondary | ICD-10-CM | POA: Insufficient documentation

## 2020-01-30 DIAGNOSIS — Z87891 Personal history of nicotine dependence: Secondary | ICD-10-CM | POA: Insufficient documentation

## 2020-01-30 DIAGNOSIS — Z79899 Other long term (current) drug therapy: Secondary | ICD-10-CM | POA: Diagnosis not present

## 2020-01-30 DIAGNOSIS — O039 Complete or unspecified spontaneous abortion without complication: Secondary | ICD-10-CM | POA: Diagnosis not present

## 2020-01-30 DIAGNOSIS — Z3A00 Weeks of gestation of pregnancy not specified: Secondary | ICD-10-CM | POA: Diagnosis not present

## 2020-01-30 LAB — WET PREP, GENITAL
Clue Cells Wet Prep HPF POC: NONE SEEN
Sperm: NONE SEEN
Trich, Wet Prep: NONE SEEN
WBC, Wet Prep HPF POC: NONE SEEN
Yeast Wet Prep HPF POC: NONE SEEN

## 2020-01-30 LAB — CHLAMYDIA/NGC RT PCR (ARMC ONLY)
Chlamydia Tr: NOT DETECTED
N gonorrhoeae: NOT DETECTED

## 2020-01-30 LAB — HCG, QUANTITATIVE, PREGNANCY: hCG, Beta Chain, Quant, S: 700 m[IU]/mL — ABNORMAL HIGH (ref <5)

## 2020-01-30 LAB — ABO/RH: ABO/RH(D): A POS

## 2020-01-30 NOTE — ED Triage Notes (Signed)
Pt in via POV, reports beginning with vaginal bleeding this morning, states she is approximately [redacted] weeks pregnant.  Pt tearful in triage, states this will be her second miscarriage in a year.

## 2020-01-30 NOTE — ED Provider Notes (Signed)
[facility] Emergency Department Provider Note   ____________________________________________   First MD Initiated Contact with Patient 01/30/20 [phone]     (approximate)  I have reviewed the triage vital signs and the nursing notes.   HISTORY  Chief Complaint Vaginal Bleeding    HPI Stacy West is a 39 y.o. female G4 P1-0-2-1 at approximately 7 weeks of pregnancy presents to the ED complaining of vaginal bleeding.  Patient reports that earlier this morning she had first noticed some vaginal spotting, but afterwards began to pass multiple dime sized clots.  She has gone through only 1 pad since onset of bleeding, but does note additional lower abdominal cramping.  She has not had any nausea or vomiting, denies any changes in her bowel movements, and has not had any dysuria or hematuria.  She is concerned that she could be having another miscarriage and reports having a similar 1 earlier this year.        Past Medical History:  Diagnosis Date  . Abnormal Pap smear of cervix   . Ehlers-Danlos disease   . Fibromyalgia   . Heart murmur   . Insulin resistance   . Miscarriage   . PCOS (polycystic ovarian syndrome)   . PCOS (polycystic ovarian syndrome)     Patient Active Problem List   Diagnosis Date Noted  . Right ovarian cyst 08/15/2019  . Encounter for preconception consultation 06/08/2019  . MVP (mitral valve prolapse) 05/31/2019  . Ehlers-Danlos syndrome type III 05/17/2019  . PCOS (polycystic ovarian syndrome) 05/17/2019    Past Surgical History:  Procedure Laterality Date  . BACK SURGERY    . COLPOSCOPY    . FINGER SURGERY Left     Prior to Admission medications   Medication Sig Start Date End Date Taking? Authorizing Provider  metFORMIN (GLUCOPHAGE-XR) 500 MG 24 hr tablet Take 1,000 mg by mouth 2 (two) times daily. 01/21/20  Yes [provider]  Prenatal Vit-Fe Fumarate-FA (PRENATAL MULTIVITAMIN) TABS tablet Take 1 tablet by  mouth daily at 12 noon.   Yes [provider]    Allergies Carisoprodol and Percocet  [oxycodone-acetaminophen]  Family History  Problem Relation Age of Onset  . Heart murmur Mother   . Asthma Mother   . Skin cancer Father   . COPD Father   . Heart murmur Maternal Aunt   . Breast cancer Maternal Aunt   . Bipolar disorder Brother   . Depression Brother   . Alcoholism Brother   . Depression Brother     Social History Social History   Tobacco Use  . Smoking status: Former Smoker    Types: Cigarettes    Quit date: 2018    Years since quitting: 3.2  . Smokeless tobacco: Never Used  Substance Use Topics  . Alcohol use: Not Currently  . Drug use: Not Currently    Review of Systems  Constitutional: No fever/chills Eyes: No visual changes. ENT: No sore throat. Cardiovascular: Denies chest pain. Respiratory: Denies shortness of breath. Gastrointestinal: Positive for abdominal pain.  No nausea, no vomiting.  No diarrhea.  No constipation. Genitourinary: Negative for dysuria.  Positive for vaginal bleeding. Musculoskeletal: Negative for back pain. Skin: Negative for rash. Neurological: Negative for headaches, focal weakness or numbness.  ____________________________________________   PHYSICAL EXAM:  VITAL SIGNS: ED Triage Vitals  Enc Vitals Group     BP 01/30/20 0911 (!) 125/91     Pulse Rate 01/30/20 0911 92     Resp 01/30/20 0911 17  Temp 01/30/20 0911 98.4 F (36.9 C)     Temp Source 01/30/20 0911 Oral     SpO2 01/30/20 0911 98 %     Weight 01/30/20 0912 225 lb (102.1 kg)     Height 01/30/20 0912 5\' 11"  (1.803 m)     Head Circumference --      Peak Flow --      Pain Score 01/30/20 0912 3     Pain Loc --      Pain Edu? --      Excl. in [place]? --     Constitutional: Alert and oriented. Eyes: Conjunctivae are normal. Head: Atraumatic. Nose: No congestion/rhinnorhea. Mouth/Throat: Mucous membranes are moist. Neck: Normal ROM Cardiovascular:  Normal rate, regular rhythm. Grossly normal heart sounds. Respiratory: Normal respiratory effort.  No retractions. Lungs CTAB. Gastrointestinal: Soft and nontender. No distention. Genitourinary: Moderate amount of bleeding from cervical os, no tissue noted. Os remains closed, no cervical motion or adnexal tenderness. Musculoskeletal: No lower extremity tenderness nor edema. Neurologic:  Normal speech and language. No gross focal neurologic deficits are appreciated. Skin:  Skin is warm, dry and intact. No rash noted. Psychiatric: Mood and affect are normal. Speech and behavior are normal.  ____________________________________________   LABS (all labs ordered are listed, but only abnormal results are displayed)  Labs Reviewed  HCG, QUANTITATIVE, PREGNANCY - Abnormal; Notable for the following components:      Result Value   hCG, Beta Chain, Quant, S 700 (*)    All other components within normal limits  WET PREP, GENITAL  CHLAMYDIA/NGC RT PCR ([facility] ONLY)  ABO/RH  ABO/RH    PROCEDURES  Procedure(s) performed (including Critical Care):  Procedures   ____________________________________________   INITIAL IMPRESSION / ASSESSMENT AND PLAN / ED COURSE       39 year old female, G4 P1-2-0-1 presents to the ED at approximately 7 weeks of pregnancy complaining of vaginal bleeding and lower abdominal cramping since this morning.  She had an ultrasound earlier this pregnancy that showed intrauterine gestational sac but no obvious yolk sac.  We will reassess with ultrasound here in the ED, check beta hCG.  There is also no prior documentation of her blood type, will check type and Rh to determine need for RhoGam.  Patient is Rh+, no indication for RhoGam.  Ultrasound shows no evidence of intrauterine pregnancy and given her low beta hCG, I am concerned for spontaneous miscarriage.  Bleeding and pain are well controlled at this time and patient is appropriate for discharge home with  expectant management.  She was counseled to follow-up with OB/GYN and return to the ED for worsening pain or bleeding.  Patient agrees with plan.      ____________________________________________   FINAL CLINICAL IMPRESSION(S) / ED DIAGNOSES  Final diagnoses:  Vaginal bleeding  Spontaneous miscarriage     ED Discharge Orders    None       Note:  This document was prepared using Dragon voice recognition software and may include unintentional dictation errors.   Blake Divine, MD 01/30/20 1215

## 2020-01-31 ENCOUNTER — Inpatient Hospital Stay: Admission: RE | Admit: 2020-01-31 | Payer: Medicare HMO | Source: Ambulatory Visit

## 2020-01-31 DIAGNOSIS — K900 Celiac disease: Secondary | ICD-10-CM | POA: Insufficient documentation

## 2020-02-05 DIAGNOSIS — N96 Recurrent pregnancy loss: Secondary | ICD-10-CM | POA: Diagnosis not present

## 2020-02-06 DIAGNOSIS — Z1379 Encounter for other screening for genetic and chromosomal anomalies: Secondary | ICD-10-CM | POA: Diagnosis not present

## 2020-02-06 DIAGNOSIS — Z31430 Encounter of female for testing for genetic disease carrier status for procreative management: Secondary | ICD-10-CM | POA: Diagnosis not present

## 2020-02-06 DIAGNOSIS — N96 Recurrent pregnancy loss: Secondary | ICD-10-CM | POA: Diagnosis not present

## 2020-02-06 DIAGNOSIS — Z79899 Other long term (current) drug therapy: Secondary | ICD-10-CM | POA: Diagnosis not present

## 2020-02-14 DIAGNOSIS — B999 Unspecified infectious disease: Secondary | ICD-10-CM | POA: Diagnosis not present

## 2020-02-21 DIAGNOSIS — L680 Hirsutism: Secondary | ICD-10-CM | POA: Diagnosis not present

## 2020-02-21 DIAGNOSIS — E282 Polycystic ovarian syndrome: Secondary | ICD-10-CM | POA: Diagnosis not present

## 2020-02-22 DIAGNOSIS — L680 Hirsutism: Secondary | ICD-10-CM | POA: Diagnosis not present

## 2020-02-22 DIAGNOSIS — Z8759 Personal history of other complications of pregnancy, childbirth and the puerperium: Secondary | ICD-10-CM | POA: Diagnosis not present

## 2020-02-22 DIAGNOSIS — E282 Polycystic ovarian syndrome: Secondary | ICD-10-CM | POA: Diagnosis not present

## 2020-02-22 DIAGNOSIS — Z3189 Encounter for other procreative management: Secondary | ICD-10-CM | POA: Diagnosis not present

## 2020-03-31 DIAGNOSIS — Z8371 Family history of colonic polyps: Secondary | ICD-10-CM | POA: Diagnosis not present

## 2020-03-31 DIAGNOSIS — Q7962 Hypermobile Ehlers-Danlos syndrome: Secondary | ICD-10-CM | POA: Diagnosis not present

## 2020-03-31 DIAGNOSIS — K219 Gastro-esophageal reflux disease without esophagitis: Secondary | ICD-10-CM | POA: Diagnosis not present

## 2020-03-31 DIAGNOSIS — K589 Irritable bowel syndrome without diarrhea: Secondary | ICD-10-CM | POA: Diagnosis not present

## 2020-04-07 DIAGNOSIS — E282 Polycystic ovarian syndrome: Secondary | ICD-10-CM | POA: Diagnosis not present

## 2020-04-07 DIAGNOSIS — Z87891 Personal history of nicotine dependence: Secondary | ICD-10-CM | POA: Diagnosis not present

## 2020-04-07 DIAGNOSIS — Z8759 Personal history of other complications of pregnancy, childbirth and the puerperium: Secondary | ICD-10-CM | POA: Diagnosis not present

## 2020-04-07 DIAGNOSIS — L680 Hirsutism: Secondary | ICD-10-CM | POA: Diagnosis not present

## 2020-04-07 DIAGNOSIS — K900 Celiac disease: Secondary | ICD-10-CM | POA: Diagnosis not present

## 2020-04-07 DIAGNOSIS — R635 Abnormal weight gain: Secondary | ICD-10-CM | POA: Diagnosis not present

## 2020-04-07 DIAGNOSIS — Z09 Encounter for follow-up examination after completed treatment for conditions other than malignant neoplasm: Secondary | ICD-10-CM | POA: Diagnosis not present

## 2020-04-07 DIAGNOSIS — N920 Excessive and frequent menstruation with regular cycle: Secondary | ICD-10-CM | POA: Diagnosis not present

## 2020-04-07 DIAGNOSIS — M797 Fibromyalgia: Secondary | ICD-10-CM | POA: Diagnosis not present

## 2020-04-18 DIAGNOSIS — R197 Diarrhea, unspecified: Secondary | ICD-10-CM | POA: Diagnosis not present

## 2020-04-18 DIAGNOSIS — K635 Polyp of colon: Secondary | ICD-10-CM | POA: Diagnosis not present

## 2020-04-18 DIAGNOSIS — K3189 Other diseases of stomach and duodenum: Secondary | ICD-10-CM | POA: Diagnosis not present

## 2020-04-18 DIAGNOSIS — R194 Change in bowel habit: Secondary | ICD-10-CM | POA: Diagnosis not present

## 2020-04-18 DIAGNOSIS — K319 Disease of stomach and duodenum, unspecified: Secondary | ICD-10-CM | POA: Diagnosis not present

## 2020-04-18 DIAGNOSIS — K219 Gastro-esophageal reflux disease without esophagitis: Secondary | ICD-10-CM | POA: Diagnosis not present

## 2020-04-30 DIAGNOSIS — Z3169 Encounter for other general counseling and advice on procreation: Secondary | ICD-10-CM | POA: Diagnosis not present

## 2020-05-05 DIAGNOSIS — M9902 Segmental and somatic dysfunction of thoracic region: Secondary | ICD-10-CM | POA: Diagnosis not present

## 2020-05-05 DIAGNOSIS — M5412 Radiculopathy, cervical region: Secondary | ICD-10-CM | POA: Diagnosis not present

## 2020-05-05 DIAGNOSIS — M9901 Segmental and somatic dysfunction of cervical region: Secondary | ICD-10-CM | POA: Diagnosis not present

## 2020-05-05 DIAGNOSIS — R519 Headache, unspecified: Secondary | ICD-10-CM | POA: Diagnosis not present

## 2020-05-06 ENCOUNTER — Emergency Department: Payer: Medicare HMO

## 2020-05-06 ENCOUNTER — Emergency Department
Admission: EM | Admit: 2020-05-06 | Discharge: 2020-05-06 | Disposition: A | Discharge status: Home / Self Care | Payer: Medicare HMO | Attending: Student in an Organized Health Care Education/Training Program | Admitting: Student in an Organized Health Care Education/Training Program

## 2020-05-06 ENCOUNTER — Other Ambulatory Visit: Payer: Self-pay

## 2020-05-06 DIAGNOSIS — M542 Cervicalgia: Secondary | ICD-10-CM | POA: Diagnosis not present

## 2020-05-06 DIAGNOSIS — M62838 Other muscle spasm: Secondary | ICD-10-CM | POA: Diagnosis not present

## 2020-05-06 DIAGNOSIS — Q796 Ehlers-Danlos syndrome, unspecified: Secondary | ICD-10-CM | POA: Insufficient documentation

## 2020-05-06 MED ORDER — HYDROMORPHONE HCL 2 MG PO TABS: 2.0000 mg | ORAL_TABLET | Freq: Two times a day (BID) | ORAL | 0 refills | Status: AC | PRN

## 2020-05-06 MED ORDER — ORPHENADRINE CITRATE 30 MG/ML IJ SOLN
60.0000 mg | Freq: Two times a day (BID) | INTRAMUSCULAR | Status: DC
  Administered 2020-05-06: 60 mg via INTRAMUSCULAR
  Filled 2020-05-06: qty 2

## 2020-05-06 MED ORDER — ORPHENADRINE CITRATE ER 100 MG PO TB12: 100.0000 mg | ORAL_TABLET | Freq: Two times a day (BID) | ORAL | 0 refills | Status: DC

## 2020-05-06 MED ORDER — HYDROMORPHONE HCL 1 MG/ML IJ SOLN
1.0000 mg | Freq: Once | INTRAMUSCULAR | Status: AC
  Administered 2020-05-06: 1 mg via INTRAMUSCULAR
  Filled 2020-05-06: qty 1

## 2020-05-06 NOTE — Discharge Instructions (Addendum)
Your CT scan was remarkable for cervical lordosis centered around the level of C4.  This is normally a muscle condition and should respond well to a strong muscle relaxer and pain medication.

## 2020-05-06 NOTE — ED Notes (Signed)
See triage note  Presents with increased neck pain  States pain became worse after getting an adjustment yesterday  Pt has a hx of Ehler-danlos disease   Has had back surgery in past  Pt is tearful

## 2020-05-06 NOTE — ED Triage Notes (Signed)
Patient reports C1,C2 neck fusion 2 years ago. Has been having increased pain. States she went to the chiropractor yesterday and today pain is worse.

## 2020-05-06 NOTE — ED Provider Notes (Signed)
[facility] Emergency Department Provider Note   ____________________________________________   First MD Initiated Contact with Patient 05/06/20 0848     (approximate)  I have reviewed the triage vital signs and the nursing notes.   HISTORY  Chief Complaint Neck Pain    HPI Stacy West is a 39 y.o. female patient complain of increased neck pain status post chiropractor session yesterday.  Patient states she was having neck pain and upper back pain secondary to Ehler-Danlos disease.  Patient had a fusion of C1 and C2.  Patient states she received moderate relief after manipulation of the thoracic spine but started having pain with manipulation of the cervical spine.  Patient states pain increased overnight.  Patient said pain radiates from her neck to bilateral shoulder.  Patient the pain increased with any movement of the neck.  Rates the pain as a 9/10 with movement.  States pain is a constant 4/10 without movement.  No palliative measure for complaint prior to arrival.         Past Medical History:  Diagnosis Date  . Abnormal Pap smear of cervix   . Ehlers-Danlos disease   . Fibromyalgia   . Heart murmur   . Insulin resistance   . Miscarriage   . PCOS (polycystic ovarian syndrome)   . PCOS (polycystic ovarian syndrome)     Patient Active Problem List   Diagnosis Date Noted  . Right ovarian cyst 08/15/2019  . Encounter for preconception consultation 06/08/2019  . MVP (mitral valve prolapse) 05/31/2019  . Ehlers-Danlos syndrome type III 05/17/2019  . PCOS (polycystic ovarian syndrome) 05/17/2019    Past Surgical History:  Procedure Laterality Date  . BACK SURGERY    . COLPOSCOPY    . FINGER SURGERY Left     Prior to Admission medications   Medication Sig Start Date End Date Taking? Authorizing Provider  dicyclomine (BENTYL) 20 MG tablet Take 20 mg by mouth every 6 (six) hours.   Yes [provider]  HYDROmorphone (DILAUDID)  2 MG tablet Take 1 tablet (2 mg total) by mouth every 12 (twelve) hours as needed for up to 5 days for severe pain. 05/06/20 05/11/20  Sable Feil, PA-C  metFORMIN (GLUCOPHAGE-XR) 500 MG 24 hr tablet Take 1,000 mg by mouth 2 (two) times daily. 01/21/20   [provider]  orphenadrine (NORFLEX) 100 MG tablet Take 1 tablet (100 mg total) by mouth 2 (two) times daily. 05/06/20   Sable Feil, PA-C    Allergies Carisoprodol and Percocet  [oxycodone-acetaminophen]  Family History  Problem Relation Age of Onset  . Heart murmur Mother   . Asthma Mother   . Skin cancer Father   . COPD Father   . Heart murmur Maternal Aunt   . Breast cancer Maternal Aunt   . Bipolar disorder Brother   . Depression Brother   . Alcoholism Brother   . Depression Brother     Social History Social History   Tobacco Use  . Smoking status: Former Smoker    Types: Cigarettes    Quit date: 2018    Years since quitting: 3.4  . Smokeless tobacco: Never Used  Vaping Use  . Vaping Use: Never used  Substance Use Topics  . Alcohol use: Not Currently  . Drug use: Not Currently    Review of Systems Constitutional: No fever/chills Eyes: No visual changes. ENT: No sore throat. Cardiovascular: Denies chest pain. Respiratory: Denies shortness of breath. Gastrointestinal: No abdominal pain.  No  nausea, no vomiting.  No diarrhea.  No constipation. Genitourinary: Negative for dysuria.  PCOS Musculoskeletal: Positive for neck pain  skin: Negative for rash. Neurological: Negative for headaches, focal weakness or numbness. Endocrine:  Insulin resistance Hematological/Lymphatic:  Allergic/Immunilogical: Carisoprodol and Percocets. ____________________________________________   PHYSICAL EXAM:  VITAL SIGNS: ED Triage Vitals  Enc Vitals Group     BP 05/06/20 0753 110/74     Pulse Rate 05/06/20 0753 (!) 104     Resp 05/06/20 0753 18     Temp 05/06/20 0753 98.9 F (37.2 C)     Temp Source 05/06/20  0753 Oral     SpO2 05/06/20 0753 99 %     Weight 05/06/20 0754 205 lb (93 kg)     Height 05/06/20 0754 5\' 11"  (1.803 m)     Head Circumference --      Peak Flow --      Pain Score 05/06/20 0754 9     Pain Loc --      Pain Edu? --      Excl. in [place]? --     Constitutional: Alert and oriented.  Moderate distress.   Neck:  cervical spine tenderness to palpation.  Decreased range of motion is all fields Cardiovascular: Tachycardic, regular rhythm. Grossly normal heart sounds.  Good peripheral circulation. Respiratory: Normal respiratory effort.  No retractions. Lungs CTAB. Musculoskeletal: No lower extremity tenderness nor edema.  No joint effusions. Neurologic:  Normal speech and language. No gross focal neurologic deficits are appreciated. No gait instability. Skin:  Skin is warm, dry and intact. No rash noted. Psychiatric: Mood and affect are normal. Speech and behavior are normal.  ____________________________________________   LABS (all labs ordered are listed, but only abnormal results are displayed)  Labs Reviewed - No data to display ____________________________________________  EKG   ____________________________________________  RADIOLOGY  ED MD interpretation:   Official radiology report(s): CT Cervical Spine Wo Contrast  Result Date: 05/06/2020 CLINICAL DATA:  39 year old female status post cervical spinal fusion. Neck pain. EXAM: CT CERVICAL SPINE WITHOUT CONTRAST TECHNIQUE: Multidetector CT imaging of the cervical spine was performed without intravenous contrast. Multiplanar CT image reconstructions were also generated. COMPARISON:  No priors. FINDINGS: Alignment: Reversal of normal cervical lordosis centered at the level of C4. Alignment is otherwise anatomic. Skull base and vertebrae: Posterior rod and screw fixation device extends from the occipital bone to the level of C2. Status post ACDF at C6-C7 with fully incorporated bone graft at the C6-C7 interspace. No  acute displaced fractures in the cervical spine. Soft tissues and spinal canal: No prevertebral fluid or swelling. No visible canal hematoma. Disc levels: Mild multilevel degenerative disc disease, most pronounced at C4-C5. No significant facet arthropathy. Upper chest: Unremarkable. Other: None. IMPRESSION: 1. No acute abnormality of the cervical spine. 2. Postoperative changes, as above. Electronically Signed   By: Vinnie Langton M.D.   On: 05/06/2020 10:07    ____________________________________________   PROCEDURES  Procedure(s) performed (including Critical Care):  Procedures   ____________________________________________   INITIAL IMPRESSION / ASSESSMENT AND PLAN / ED COURSE  As part of my medical decision making, I reviewed the following data within the [place]     Patient presents with acute neck pain with radicular component to the bilateral shoulder.  Patient recently had cervical fusion.  Differentials consist of cervical fracture, malalignment of fusion, or cervical strain.  Discussed CT findings with patient revealing no acute abnormality with a recent fusion.  Patient complaint physical exam consistent  with severe muscle strain.  Patient responded well to Dilaudid and Norflex.  Patient advised to follow-up with treating doctor and take medication as directed.    Stacy West was evaluated in Emergency Department on 05/06/2020 for the symptoms described in the history of present illness. She was evaluated in the context of the global COVID-19 pandemic, which necessitated consideration that the patient might be at risk for infection with the SARS-CoV-2 virus that causes COVID-19. Institutional protocols and algorithms that pertain to the evaluation of patients at risk for COVID-19 are in a state of rapid change based on information released by regulatory bodies including the CDC and federal and state organizations. These policies and algorithms were followed  during the patient's care in the ED.       ____________________________________________   FINAL CLINICAL IMPRESSION(S) / ED DIAGNOSES  Final diagnoses:  Muscle spasms of neck     ED Discharge Orders         Ordered    HYDROmorphone (DILAUDID) 2 MG tablet  Every 12 hours PRN     Discontinue  Reprint     05/06/20 1041    orphenadrine (NORFLEX) 100 MG tablet  2 times daily     Discontinue  Reprint     05/06/20 1041           Note:  This document was prepared using Dragon voice recognition software and may include unintentional dictation errors.    Sable Feil, PA-C 05/06/20 1045    Merlyn Lot, MD 05/06/20 1133

## 2020-05-07 DIAGNOSIS — M9901 Segmental and somatic dysfunction of cervical region: Secondary | ICD-10-CM | POA: Diagnosis not present

## 2020-05-07 DIAGNOSIS — R519 Headache, unspecified: Secondary | ICD-10-CM | POA: Diagnosis not present

## 2020-05-07 DIAGNOSIS — M9902 Segmental and somatic dysfunction of thoracic region: Secondary | ICD-10-CM | POA: Diagnosis not present

## 2020-05-07 DIAGNOSIS — M5412 Radiculopathy, cervical region: Secondary | ICD-10-CM | POA: Diagnosis not present

## 2020-05-08 DIAGNOSIS — M5030 Other cervical disc degeneration, unspecified cervical region: Secondary | ICD-10-CM | POA: Diagnosis not present

## 2020-05-08 DIAGNOSIS — G894 Chronic pain syndrome: Secondary | ICD-10-CM | POA: Diagnosis not present

## 2020-05-08 DIAGNOSIS — M47812 Spondylosis without myelopathy or radiculopathy, cervical region: Secondary | ICD-10-CM | POA: Diagnosis not present

## 2020-05-08 DIAGNOSIS — M797 Fibromyalgia: Secondary | ICD-10-CM | POA: Diagnosis not present

## 2020-05-08 DIAGNOSIS — M546 Pain in thoracic spine: Secondary | ICD-10-CM | POA: Diagnosis not present

## 2020-05-08 DIAGNOSIS — Q796 Ehlers-Danlos syndrome, unspecified: Secondary | ICD-10-CM | POA: Diagnosis not present

## 2020-05-20 DIAGNOSIS — Z3141 Encounter for fertility testing: Secondary | ICD-10-CM | POA: Diagnosis not present

## 2020-05-26 ENCOUNTER — Other Ambulatory Visit: Payer: Self-pay

## 2020-05-26 NOTE — Patient Outreach (Signed)
[facility]) Care Management  05/26/2020  Stacy West Oct 08, 1981 [id number]   Telephone Screen  Referral Date: 05/26/2020 Referral Source: [facility] Nurse Line Referral Reason: "freq miscarriages, infertility, chronic pain" Insurance: [employer]   Outreach attempt #1 to patient. Spoke with patient and discussed and reviewed referral resource and reason. Patient has long extensive OB/GYN history. She is being followed by OB/GYN at [facility] as well as endocrinology/reproductive specialist at [facility]. She states she has been diagnosed with post menopasual and infertility issues.  She reports that she does not have a PCP as she uses OB/GYN for all her issues that they are mostly related to her infertility. Patient states she moved from [place] about ear ago. RN CM encouraged patient to consider finding a PCP and instructed her to contact [facility] customer service to request a list of in network providers. Patient reports she I wanting s MD to prescribe her some prn pain meds but is adamant that she does not want to be under a pain contract or have to visit MD monthly and undergo urine testing. RN Halford Chessman spoke at length regarding regulations that MD must follow when prescribing pain med. She reports that when she lived in [place] she was able to "some legal marijuana" prescribed by MD. She is aware that it is not currently legal in [place]. RN CM discussed [facility] services and what we can and can not do/provide. Patient does not have any current [facility] needs as well as she is not currently being followed by [facility] provider. She is aware to contact [employer] as she had several questions/concerns regarding prior auths and in network vs out of network questions that RN CM unable to answer.     Plan: RN CM will close case at this time.    Enzo Montgomery, RN,BSN,CCM [place] Management Telephonic Care Management Coordinator Direct Phone: [phone] Toll Free: [phone] Fax: [phone]

## 2020-06-06 DIAGNOSIS — Z3141 Encounter for fertility testing: Secondary | ICD-10-CM | POA: Diagnosis not present

## 2020-06-13 DIAGNOSIS — Z3201 Encounter for pregnancy test, result positive: Secondary | ICD-10-CM | POA: Diagnosis not present

## 2020-06-19 ENCOUNTER — Ambulatory Visit: Payer: Medicare HMO | Admitting: Psychology

## 2020-06-25 DIAGNOSIS — E282 Polycystic ovarian syndrome: Secondary | ICD-10-CM | POA: Diagnosis not present

## 2020-06-27 ENCOUNTER — Ambulatory Visit (INDEPENDENT_AMBULATORY_CARE_PROVIDER_SITE_OTHER): Payer: Medicare HMO | Admitting: Psychology

## 2020-06-27 DIAGNOSIS — F603 Borderline personality disorder: Secondary | ICD-10-CM | POA: Diagnosis not present

## 2020-07-07 DIAGNOSIS — Q796 Ehlers-Danlos syndrome, unspecified: Secondary | ICD-10-CM | POA: Diagnosis not present

## 2020-07-07 DIAGNOSIS — I341 Nonrheumatic mitral (valve) prolapse: Secondary | ICD-10-CM | POA: Diagnosis not present

## 2020-07-07 DIAGNOSIS — R002 Palpitations: Secondary | ICD-10-CM | POA: Diagnosis not present

## 2020-07-15 ENCOUNTER — Ambulatory Visit (INDEPENDENT_AMBULATORY_CARE_PROVIDER_SITE_OTHER): Payer: Medicare HMO | Admitting: Psychology

## 2020-07-15 DIAGNOSIS — Z3200 Encounter for pregnancy test, result unknown: Secondary | ICD-10-CM | POA: Diagnosis not present

## 2020-07-15 DIAGNOSIS — F603 Borderline personality disorder: Secondary | ICD-10-CM

## 2020-07-16 HISTORY — PX: NECK SURGERY: SHX720

## 2020-07-18 DIAGNOSIS — Z3200 Encounter for pregnancy test, result unknown: Secondary | ICD-10-CM | POA: Diagnosis not present

## 2020-07-29 ENCOUNTER — Ambulatory Visit (INDEPENDENT_AMBULATORY_CARE_PROVIDER_SITE_OTHER): Payer: Medicare HMO | Admitting: Psychology

## 2020-07-29 DIAGNOSIS — F603 Borderline personality disorder: Secondary | ICD-10-CM

## 2020-08-01 DIAGNOSIS — R002 Palpitations: Secondary | ICD-10-CM | POA: Diagnosis not present

## 2020-08-08 DIAGNOSIS — Z3A00 Weeks of gestation of pregnancy not specified: Secondary | ICD-10-CM | POA: Diagnosis not present

## 2020-08-08 DIAGNOSIS — O0901 Supervision of pregnancy with history of infertility, first trimester: Secondary | ICD-10-CM | POA: Diagnosis not present

## 2020-08-15 ENCOUNTER — Ambulatory Visit: Payer: Medicare HMO | Admitting: Psychology

## 2020-08-15 DIAGNOSIS — Z3A00 Weeks of gestation of pregnancy not specified: Secondary | ICD-10-CM | POA: Diagnosis not present

## 2020-08-15 DIAGNOSIS — O2620 Pregnancy care for patient with recurrent pregnancy loss, unspecified trimester: Secondary | ICD-10-CM | POA: Diagnosis not present

## 2020-08-20 DIAGNOSIS — Z3A00 Weeks of gestation of pregnancy not specified: Secondary | ICD-10-CM | POA: Diagnosis not present

## 2020-08-20 DIAGNOSIS — O021 Missed abortion: Secondary | ICD-10-CM | POA: Diagnosis not present

## 2020-08-25 ENCOUNTER — Telehealth: Payer: Self-pay

## 2020-08-25 NOTE — Telephone Encounter (Signed)
Please advise 

## 2020-08-25 NOTE — Telephone Encounter (Signed)
Pt calling triage today states she has been seeing a reproductive endocrinologist. She was pregnant, but had a miscarriage and they are saying she needs a D&C. Does she need a referral? If not can you get her an appt scheduled with MD please?

## 2020-08-25 NOTE — Telephone Encounter (Signed)
No she just need a follow up appointment with any MD in the practice.  It looks like she was medically managed with cytotec so if that didn't work then yes D&C is the next step

## 2020-08-25 NOTE — Telephone Encounter (Signed)
Called and spoke with patient to scheduled for an opening with SDJ for Wednesday. Patient doesn't have child care for that visit. Patient states she is only available Tuesday's and Friday's. Patient reports taking cyotec and nothing has happened. Patient would like D&C done this week and wants to know if she can do a virtual visit. Please advise

## 2020-08-26 DIAGNOSIS — E282 Polycystic ovarian syndrome: Secondary | ICD-10-CM | POA: Diagnosis not present

## 2020-08-26 DIAGNOSIS — Z20822 Contact with and (suspected) exposure to covid-19: Secondary | ICD-10-CM | POA: Diagnosis not present

## 2020-08-26 DIAGNOSIS — O021 Missed abortion: Secondary | ICD-10-CM | POA: Diagnosis not present

## 2020-08-26 DIAGNOSIS — N858 Other specified noninflammatory disorders of uterus: Secondary | ICD-10-CM | POA: Diagnosis not present

## 2020-08-26 DIAGNOSIS — Z3A01 Less than 8 weeks gestation of pregnancy: Secondary | ICD-10-CM | POA: Diagnosis not present

## 2020-08-26 DIAGNOSIS — N710 Acute inflammatory disease of uterus: Secondary | ICD-10-CM | POA: Diagnosis not present

## 2020-08-26 NOTE — Telephone Encounter (Signed)
Well in that case it would need to be with Kenton Kingfisher or Schuman since they are in the [place] on Thursday

## 2020-08-27 NOTE — Telephone Encounter (Signed)
No, I can not schedule surgeries without evaluating people in the office or hospital. She could go to the ER or come to the office for a visit with an MD friday

## 2020-08-27 NOTE — Telephone Encounter (Signed)
Routing message to Dr. Gilman Schmidt. Please advise if there is away to scheduled patient for procedure at the hospital without seeing patient in clinic

## 2020-08-27 NOTE — Telephone Encounter (Signed)
Sending to SDJ about work in

## 2020-08-28 NOTE — Telephone Encounter (Signed)
Look like it was done in [place] 10/12

## 2020-09-01 ENCOUNTER — Ambulatory Visit (INDEPENDENT_AMBULATORY_CARE_PROVIDER_SITE_OTHER): Payer: Medicare HMO | Admitting: Psychology

## 2020-09-01 DIAGNOSIS — F603 Borderline personality disorder: Secondary | ICD-10-CM

## 2020-09-09 ENCOUNTER — Telehealth: Payer: Self-pay

## 2020-09-09 NOTE — Telephone Encounter (Signed)
Called patient to see if she wanted to take the appt that opened up on Dr. Andreas Blower schedule. LVM for patient to call the office if she was interested.

## 2020-09-16 ENCOUNTER — Ambulatory Visit (INDEPENDENT_AMBULATORY_CARE_PROVIDER_SITE_OTHER): Payer: Medicare HMO | Admitting: Obstetrics and Gynecology

## 2020-09-16 ENCOUNTER — Encounter: Payer: Self-pay | Admitting: Obstetrics and Gynecology

## 2020-09-16 ENCOUNTER — Ambulatory Visit (INDEPENDENT_AMBULATORY_CARE_PROVIDER_SITE_OTHER): Payer: Medicare HMO | Admitting: Psychology

## 2020-09-16 ENCOUNTER — Other Ambulatory Visit: Payer: Self-pay

## 2020-09-16 VITALS — BP 104/72 | HR 93 | Ht 71.0 in | Wt 191.1 lb

## 2020-09-16 DIAGNOSIS — Z0000 Encounter for general adult medical examination without abnormal findings: Secondary | ICD-10-CM | POA: Diagnosis not present

## 2020-09-16 DIAGNOSIS — E282 Polycystic ovarian syndrome: Secondary | ICD-10-CM

## 2020-09-16 DIAGNOSIS — Q7962 Hypermobile Ehlers-Danlos syndrome: Secondary | ICD-10-CM

## 2020-09-16 DIAGNOSIS — F603 Borderline personality disorder: Secondary | ICD-10-CM

## 2020-09-16 DIAGNOSIS — Z8759 Personal history of other complications of pregnancy, childbirth and the puerperium: Secondary | ICD-10-CM

## 2020-09-16 DIAGNOSIS — Q796 Ehlers-Danlos syndrome, unspecified: Secondary | ICD-10-CM | POA: Diagnosis not present

## 2020-09-16 DIAGNOSIS — E2839 Other primary ovarian failure: Secondary | ICD-10-CM | POA: Diagnosis not present

## 2020-09-16 DIAGNOSIS — R5382 Chronic fatigue, unspecified: Secondary | ICD-10-CM | POA: Diagnosis not present

## 2020-09-16 DIAGNOSIS — G4733 Obstructive sleep apnea (adult) (pediatric): Secondary | ICD-10-CM | POA: Diagnosis not present

## 2020-09-16 DIAGNOSIS — Z9889 Other specified postprocedural states: Secondary | ICD-10-CM | POA: Diagnosis not present

## 2020-09-16 DIAGNOSIS — M47816 Spondylosis without myelopathy or radiculopathy, lumbar region: Secondary | ICD-10-CM | POA: Diagnosis not present

## 2020-09-16 DIAGNOSIS — I341 Nonrheumatic mitral (valve) prolapse: Secondary | ICD-10-CM | POA: Diagnosis not present

## 2020-09-16 DIAGNOSIS — M797 Fibromyalgia: Secondary | ICD-10-CM | POA: Diagnosis not present

## 2020-09-16 DIAGNOSIS — M47812 Spondylosis without myelopathy or radiculopathy, cervical region: Secondary | ICD-10-CM | POA: Diagnosis not present

## 2020-09-16 DIAGNOSIS — M5137 Other intervertebral disc degeneration, lumbosacral region: Secondary | ICD-10-CM | POA: Diagnosis not present

## 2020-09-16 NOTE — Progress Notes (Signed)
NP EST CARE-pt present to est care with a obgyn due to trying to conceive at a fertility clinic. Pt is currently having hormone issues and informed that she is menopausal. Pt had a miscarriage this year.

## 2020-09-16 NOTE — Patient Instructions (Signed)
Preparing for Pregnancy If you are considering becoming pregnant, make an appointment to see your regular health care provider to learn how to prepare for a safe and healthy pregnancy (preconception care). During a preconception care visit, your health care provider will:  Do a complete physical exam, including a Pap test.  Take a complete medical history.  Give you information, answer your questions, and help you resolve problems. Preconception checklist Medical history  Tell your health care provider about any current or past medical conditions. Your pregnancy or your ability to become pregnant may be affected by chronic conditions, such as diabetes, chronic hypertension, and thyroid problems.  Include your family's medical history as well as your partner's medical history.  Tell your health care provider about any history of STIs (sexually transmitted infections).These can affect your pregnancy. In some cases, they can be passed to your baby. Discuss any concerns that you have about STIs.  If indicated, discuss the benefits of genetic testing. This testing will show whether there are any genetic conditions that may be passed from you or your partner to your baby.  Tell your health care provider about: ? Any problems you have had with conception or pregnancy. ? Any medicines you take. These include vitamins, herbal supplements, and over-the-counter medicines. ? Your history of immunizations. Discuss any vaccinations that you may need. Diet  Ask your health care provider what to include in a healthy diet that has a balance of nutrients. This is especially important when you are pregnant or preparing to become pregnant.  Ask your health care provider to help you reach a healthy weight before pregnancy. ? If you are overweight, you may be at higher risk for certain complications, such as high blood pressure, diabetes, and preterm birth. ? If you are underweight, you are more likely to  have a baby who has a low birth weight. Lifestyle, work, and home  Let your health care provider know: ? About any lifestyle habits that you have, such as alcohol use, drug use, or smoking. ? About recreational activities that may put you at risk during pregnancy, such as downhill skiing and certain exercise programs. ? Tell your health care provider about any international travel, especially any travel to places with an active [place] virus outbreak. ? About harmful substances that you may be exposed to at work or at home. These include chemicals, pesticides, radiation, or even litter boxes. ? If you do not feel safe at home. Mental health  Tell your health care provider about: ? Any history of mental health conditions, including feelings of depression, sadness, or anxiety. ? Any medicines that you take for a mental health condition. These include herbs and supplements. Home instructions to prepare for pregnancy Lifestyle   Eat a balanced diet. This includes fresh fruits and vegetables, whole grains, lean meats, low-fat dairy products, healthy fats, and foods that are high in fiber. Ask to meet with a nutritionist or registered dietitian for assistance with meal planning and goals.  Get regular exercise. Try to be active for at least 30 minutes a day on most days of the week. Ask your health care provider which activities are safe during pregnancy.  Do not use any products that contain nicotine or tobacco, such as cigarettes and e-cigarettes. If you need help quitting, ask your health care provider.  Do not drink alcohol.  Do not take illegal drugs.  Maintain a healthy weight. Ask your health care provider what weight range is right for you. General  instructions  Keep an accurate record of your menstrual periods. This makes it easier for your health care provider to determine your baby's due date.  Begin taking prenatal vitamins and folic acid supplements daily as directed by your  health care provider.  Manage any chronic conditions, such as high blood pressure and diabetes, as told by your health care provider. This is important. How do I know that I am pregnant? You may be pregnant if you have been sexually active and you miss your period. Symptoms of early pregnancy include:  Mild cramping.  Very light vaginal bleeding (spotting).  Feeling unusually tired.  Nausea and vomiting (morning sickness). If you have any of these symptoms and you suspect that you might be pregnant, you can take a home pregnancy test. These tests check for a hormone in your urine (human chorionic gonadotropin, or hCG). A woman's body begins to make this hormone during early pregnancy. These tests are very accurate. Wait until at least the first day after you miss your period to take one. If the test shows that you are pregnant (you get a positive result), call your health care provider to make an appointment for prenatal care. What should I do if I become pregnant?      Make an appointment with your health care provider as soon as you suspect you are pregnant.  Do not use any products that contain nicotine, such as cigarettes, chewing tobacco, and e-cigarettes. If you need help quitting, ask your health care provider.  Do not drink alcoholic beverages. Alcohol is related to a number of birth defects.  Avoid toxic odors and chemicals.  You may continue to have sexual intercourse if it does not cause pain or other problems, such as vaginal bleeding. This information is not intended to replace advice given to you by your health care provider. Make sure you discuss any questions you have with your health care provider. Document Revised: 11/03/2017 Document Reviewed: 05/23/2016 Elsevier Patient Education  [place].

## 2020-09-16 NOTE — Progress Notes (Signed)
GYNECOLOGY CLINIC PROGRESS NOTE Subjective:     Stacy West is a 39 y.o. [phone] female here for to establish care.   Current complaints: reports that she recently had a miscarriage approximately 3 weeks ago (treated with Cytotec initially, but then had a D&C performed due to persistent heavy bleeding, this was performed at [facility]). She also reports that she was told that she was likely perimenopausal/experiencing premature ovarian failure as evidenced by labs performed 2 months prior.  Additionally, Stacy West has a PMH significant for Ehlers-Danlos syndrome type III, PCOS, and mitral valve prolapse. She states that she still desires pregnancy, and has seen a reproductive endocrinologist last week at [facility] regarding her plan of care.  She has not yet resumed her menstrual cycles yet since her recent surgery.    Gynecologic History Patient's last menstrual period was 06/22/2020. Contraception: none Last Pap: 1 year ago per patient. Results were: normal History of STI's,    Obstetric History OB History  Gravida Para Term Preterm AB Living  5 1 1   2 1   SAB TAB Ectopic Multiple Live Births  2       1    # Outcome Date GA Lbr Len/2nd Weight Sex Delivery Anes PTL Lv  5 SAB 2021          4 Term 11/18/17   8 lb 4 oz (3.742 kg) F Vag-Spont   LIV     Complications: Placenta abruptio  3 SAB 2016          2 Gravida           1 Stacy West              Past Medical History:  Diagnosis Date  . Abnormal Pap smear of cervix   . Ehlers-Danlos disease   . Fibromyalgia   . Heart murmur   . Insulin resistance   . Miscarriage   . Other cervical disc degeneration at C4-C5 level   . PCOS (polycystic ovarian syndrome)   . PCOS (polycystic ovarian syndrome)   . SI (sacroiliac) joint dysfunction     Family History  Problem Relation Age of Onset  . Heart murmur Mother   . Asthma Mother   . Skin cancer Father   . COPD Father   . Heart murmur Maternal Aunt   . Breast cancer Maternal  Aunt   . Bipolar disorder Brother   . Depression Brother   . Alcoholism Brother   . Depression Brother   . Early menopause Maternal Aunt     Past Surgical History:  Procedure Laterality Date  . BACK SURGERY    . COLPOSCOPY    . FINGER SURGERY Left     Social History   Socioeconomic History  . Marital status: Married    Spouse name: Not on file  . Number of children: 1  . Years of education: Not on file  . Highest education level: Not on file  Occupational History  . Not on file  Tobacco Use  . Smoking status: Former Smoker    Types: Cigarettes    Quit date: 2018    Years since quitting: 3.8  . Smokeless tobacco: Never Used  Vaping Use  . Vaping Use: Never used  Substance and Sexual Activity  . Alcohol use: Not Currently  . Drug use: Not Currently  . Sexual activity: Yes    Birth control/protection: None  Other Topics Concern  . Not on file  Social History Narrative  . Not  on file   Social Determinants of Health   Financial Resource Strain:   . Difficulty of Paying Living Expenses: Not on file  Food Insecurity:   . Worried About Charity fundraiser in the Last Year: Not on file  . Ran Out of Food in the Last Year: Not on file  Transportation Needs:   . Lack of Transportation (Medical): Not on file  . Lack of Transportation (Non-Medical): Not on file  Physical Activity:   . Days of Exercise per Week: Not on file  . Minutes of Exercise per Session: Not on file  Stress:   . Feeling of Stress : Not on file  Social Connections:   . Frequency of Communication with Friends and Family: Not on file  . Frequency of Social Gatherings with Friends and Family: Not on file  . Attends Religious Services: Not on file  . Active Member of Clubs or Organizations: Not on file  . Attends Archivist Meetings: Not on file  . Marital Status: Not on file  Intimate Partner Violence:   . Fear of Current or Ex-Partner: Not on file  . Emotionally Abused: Not on file  .  Physically Abused: Not on file  . Sexually Abused: Not on file    Current Outpatient Medications on File Prior to Visit  Medication Sig Dispense Refill  . Cholecalciferol (VITAMIN D3) 75 MCG (3000 UT) TABS Take by mouth.    . metFORMIN (GLUCOPHAGE-XR) 500 MG 24 hr tablet Take 1,000 mg by mouth 2 (two) times daily.    Marland Kitchen PRENATAL VIT W/FE-METHYLFOL-FA PO Take by mouth.     No current facility-administered medications on file prior to visit.    Allergies  Allergen Reactions  . Carisoprodol Dermatitis  . Percocet  [Oxycodone-Acetaminophen] Dermatitis     Review of Systems Pertinent items noted in HPI and remainder of comprehensive ROS otherwise negative.    Objective:    BP 104/72   Pulse 93   Ht 5\' 11"  (1.803 m)   Wt 191 lb 1.6 oz (86.7 kg)   LMP 06/22/2020   BMI 26.65 kg/m   General Appearance:    Alert, cooperative, no distress, appears stated age  Head:    Normocephalic, without obvious abnormality, atraumatic  Eyes:    PERRL, conjunctiva/corneas clear, EOM's intact, both eyes  Ears:    Normal TM's and external ear canals, both ears  Nose:   Nares normal, septum midline, mucosa normal, no drainage    or sinus tenderness  Throat:   Lips, mucosa, and tongue normal; teeth and gums normal  Neck:   Supple, symmetrical, trachea midline, no adenopathy;    thyroid:  no enlargement/tenderness/nodules; no carotid   bruit or JVD  Back:     Symmetric, no curvature, ROM normal, no CVA tenderness  Lungs:     Clear to auscultation bilaterally, respirations unlabored  Chest Wall:    No tenderness or deformity   Heart:    Regular rate and rhythm, S1 and S2 normal, no murmur, rub   or gallop  Breast Exam:    No tenderness, masses, or nipple abnormality  Abdomen:     Soft, non-tender, bowel sounds active all four quadrants,    no masses, no organomegaly  Genitalia:    Normal female without lesion, discharge or tenderness  Extremities:   Extremities normal, atraumatic, no cyanosis or  edema  Pulses:   2+ and symmetric all extremities  Skin:   Skin color, texture, turgor normal, no  rashes or lesions  Lymph nodes:   Cervical, supraclavicular, and axillary nodes normal  Neurologic:   CNII-XII intact, normal strength, sensation and reflexes    throughout      Assessment:   1. Encounter for medical examination to establish care   2. History of miscarriage   3. Ehlers-Danlos syndrome type III   4. PCOS (polycystic ovarian syndrome)   5. S/P dilation and curettage   6. Premature ovarian insufficiency      Plan:   1. Discussion had with patient regarding plan of care.  Once her cycle resumes, she plans to f/u with [facility] for ovulation induction. Review of records notes that plan is to start Femara.  2. H/o Ehlers-Danlos syndrome. Notes no problems during her last full term pregnancy, used hypno-birth technique as she was told not to push. Otherwise had an uneventful pregnancy. Notes that she desires to try a waterbirth with next pregnancy.   3. S/p D&C for history of miscarriage.  Has had normal post-operative visit. Still awaiting for menstrual cycles. Advised that cycles may return usually after 4-5 weeks. If no cycles, can consider use of hormones to bring about a cycle (Provera or OCPs).  4. Premature ovarian insufficiency/failure - Reviewed labs in Care Everywhere with elevated FSH/LH, normal prolactin noted in May 2021. Pregnancy then occurred in September.  Advised that she likely had insufficiency rather than failure, as hormone levels have possibly improved in order for pregnancy to occur. Unclear cause of reason for abnormalities in hormones. Discussed pregnancies and outcomes with patient, assessed for any possible causes such has excessive bleeding (hemorrhage)/family history. Patient reports a history of a hemorrhage (however unspecific with details surrounding event). Could consider Shehaan's syndrome if onset of lab abnormalities coincides.   Patient to notify in 1-2  weeks if cycle does not resume.     A total of 45 minutes of were spent face-to-face with the patient during the encounter with greater than 50% dealing with review of records, counseling, student education, and coordination of care.   I have seen and examined the patient with Dionisio Paschal, [place] PA-S.  I have reviewed the record and concur with patient management and plan.   Rubie Maid, MD [facility]

## 2020-09-17 ENCOUNTER — Encounter: Payer: Self-pay | Admitting: Obstetrics and Gynecology

## 2020-09-23 DIAGNOSIS — Z8759 Personal history of other complications of pregnancy, childbirth and the puerperium: Secondary | ICD-10-CM | POA: Diagnosis not present

## 2020-09-23 DIAGNOSIS — M797 Fibromyalgia: Secondary | ICD-10-CM | POA: Diagnosis not present

## 2020-09-23 DIAGNOSIS — Q7962 Hypermobile Ehlers-Danlos syndrome: Secondary | ICD-10-CM | POA: Diagnosis not present

## 2020-09-28 ENCOUNTER — Encounter (INDEPENDENT_AMBULATORY_CARE_PROVIDER_SITE_OTHER): Payer: Self-pay

## 2020-09-30 ENCOUNTER — Ambulatory Visit (INDEPENDENT_AMBULATORY_CARE_PROVIDER_SITE_OTHER): Payer: Medicare HMO | Admitting: Psychology

## 2020-09-30 DIAGNOSIS — F603 Borderline personality disorder: Secondary | ICD-10-CM

## 2020-10-01 DIAGNOSIS — Z3141 Encounter for fertility testing: Secondary | ICD-10-CM | POA: Diagnosis not present

## 2020-10-17 ENCOUNTER — Ambulatory Visit: Payer: Medicare HMO | Admitting: Psychology

## 2020-10-17 ENCOUNTER — Ambulatory Visit (INDEPENDENT_AMBULATORY_CARE_PROVIDER_SITE_OTHER): Payer: Medicare HMO | Admitting: Psychology

## 2020-10-17 DIAGNOSIS — Z3200 Encounter for pregnancy test, result unknown: Secondary | ICD-10-CM | POA: Diagnosis not present

## 2020-10-17 DIAGNOSIS — F603 Borderline personality disorder: Secondary | ICD-10-CM | POA: Diagnosis not present

## 2020-10-20 ENCOUNTER — Other Ambulatory Visit: Payer: Self-pay

## 2020-10-20 DIAGNOSIS — N979 Female infertility, unspecified: Secondary | ICD-10-CM

## 2020-10-20 NOTE — Telephone Encounter (Signed)
Patient to call office to schedule Vitamin D and B12 levels. Diagnosis code is infertility.

## 2020-10-22 ENCOUNTER — Ambulatory Visit: Payer: Medicare HMO | Admitting: Hospice and Palliative Medicine

## 2020-10-23 IMAGING — CT CT CERVICAL SPINE W/O CM
3 of 4 series · 12 of 33 positions shown, 14 images · non-contrast
Comparison: No priors.

CLINICAL DATA: 39-year-old female status post cervical spinal
fusion. Neck pain.

EXAM:
CT CERVICAL SPINE WITHOUT CONTRAST
TECHNIQUE: Multidetector CT imaging of the cervical spine was performed without
intravenous contrast. Multiplanar CT image reconstructions were also
generated.

[Series 6: sagittal bone · sagittal · 0.32mm/px · 5 of 98 slices shown, 6 images]
[im 33/98  bone]
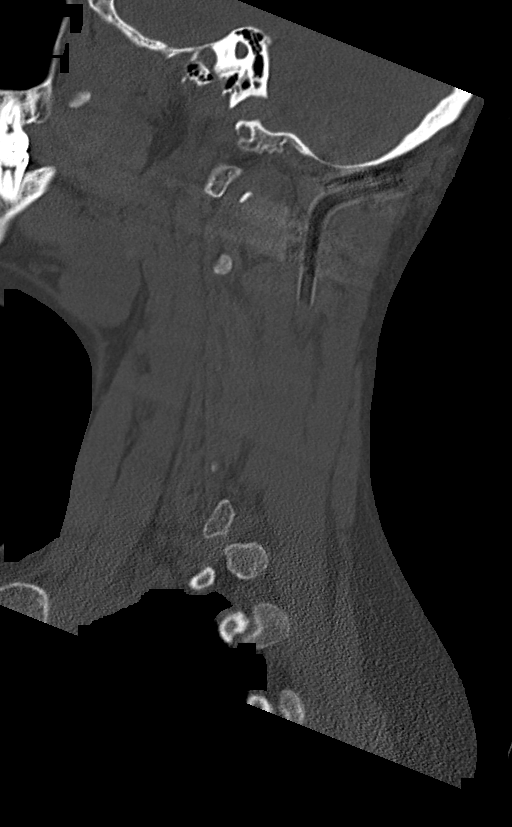
[im 41/98  bone]
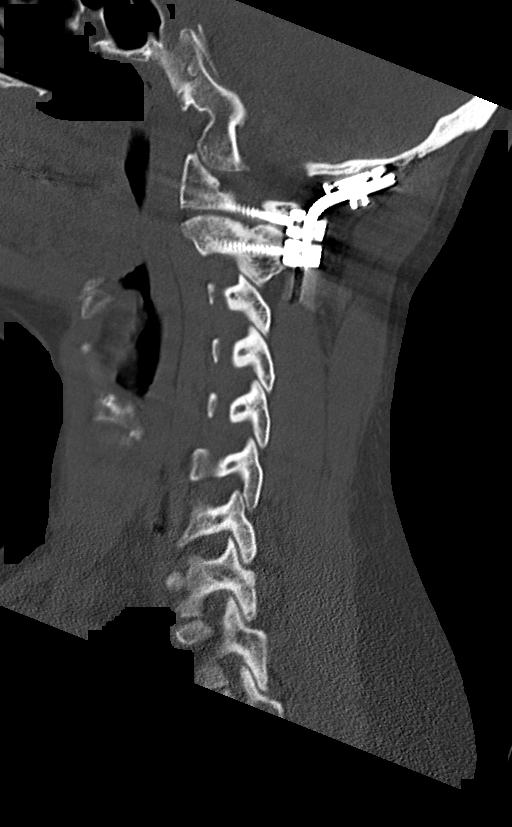
[im 49/98  soft-tissue]
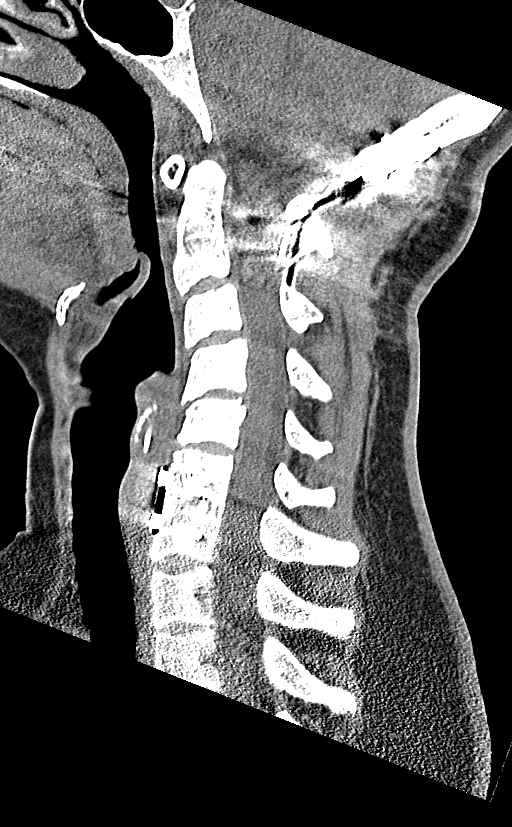
[im 49/98  bone]
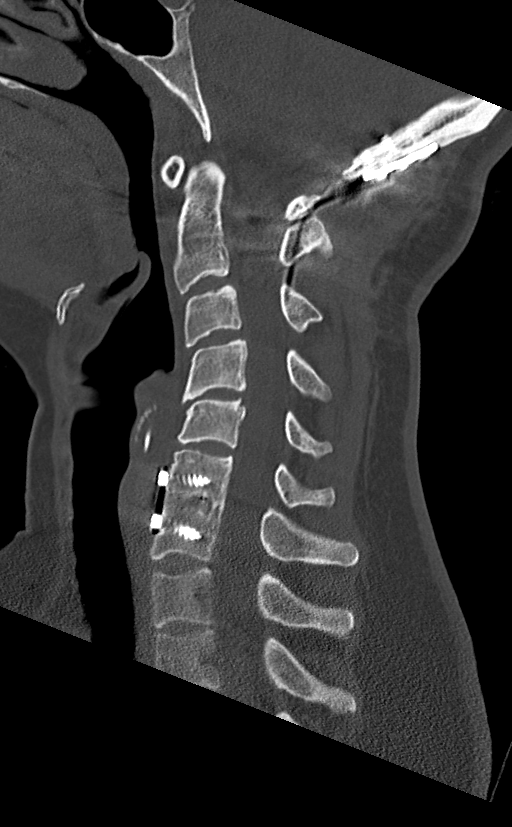
[im 57/98  bone]
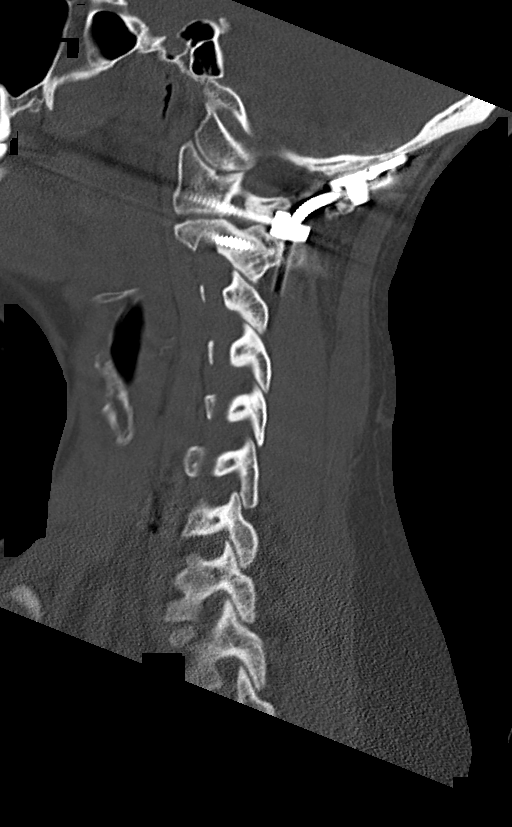
[im 65/98  bone]
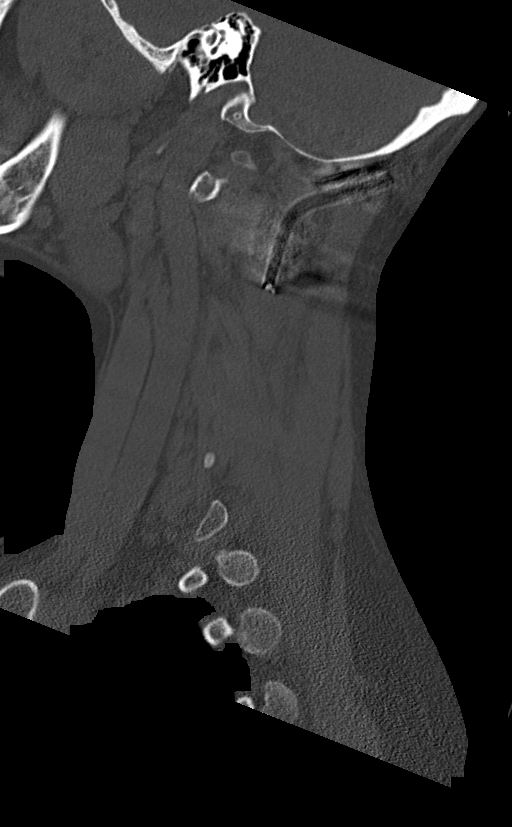

[Series 7: coronal bone · coronal · 0.38mm/px · 3 of 111 slices shown]
[im 36/111  bone]
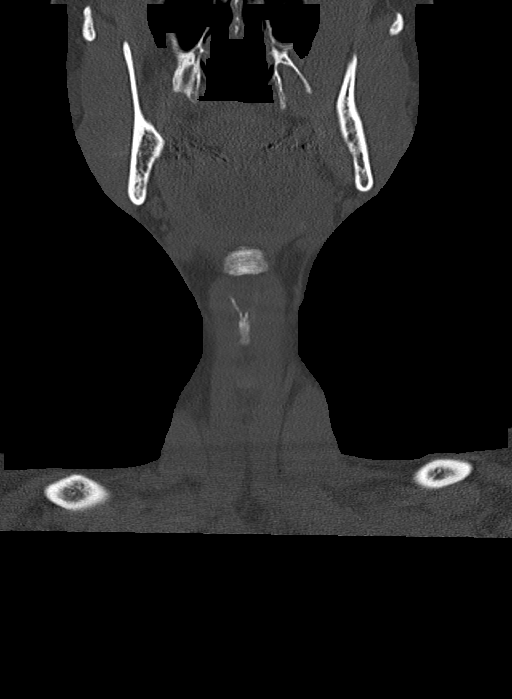
[im 49/111  bone]
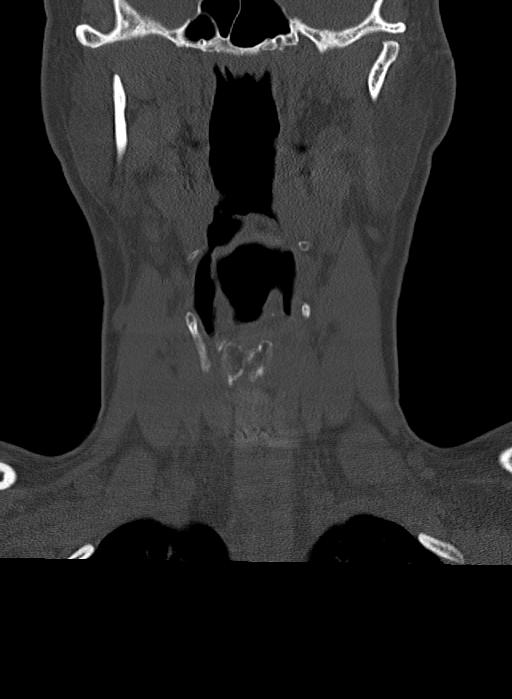
[im 62/111  bone]
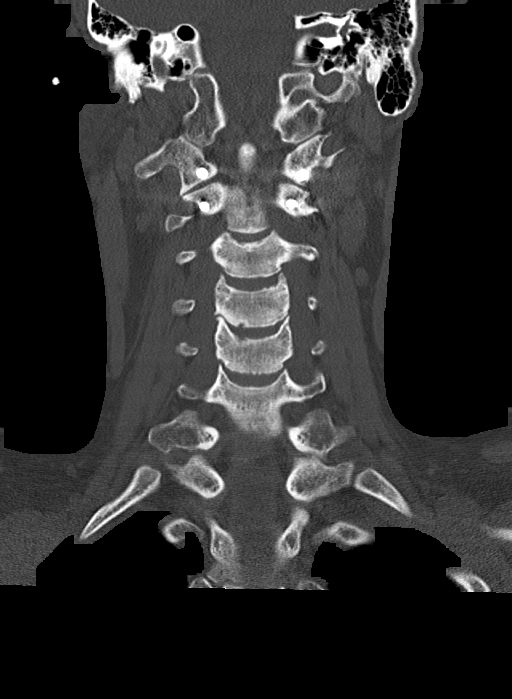

[Series 8: orthogonal bone · axial · 0.32mm/px · z∈[-335,-144]mm · 4 of 145 slices shown, 5 images]
[im 21/145  soft-tissue]
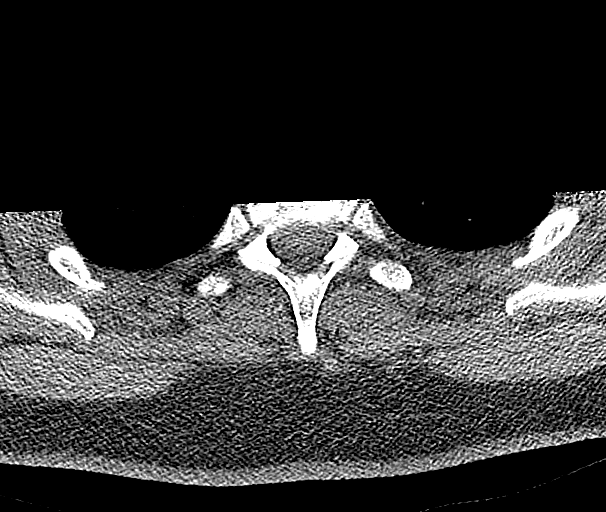
[im 21/145  bone]
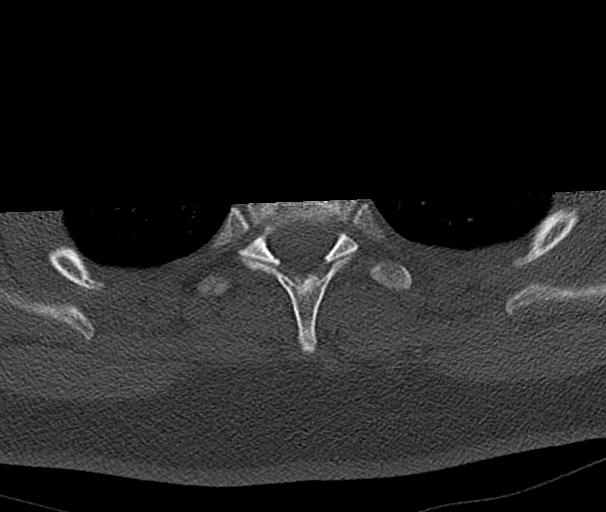
[im 62/145  bone]
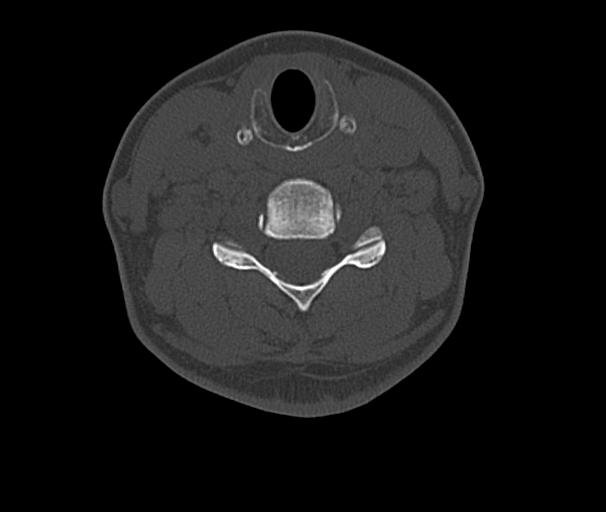
[im 83/145  bone]
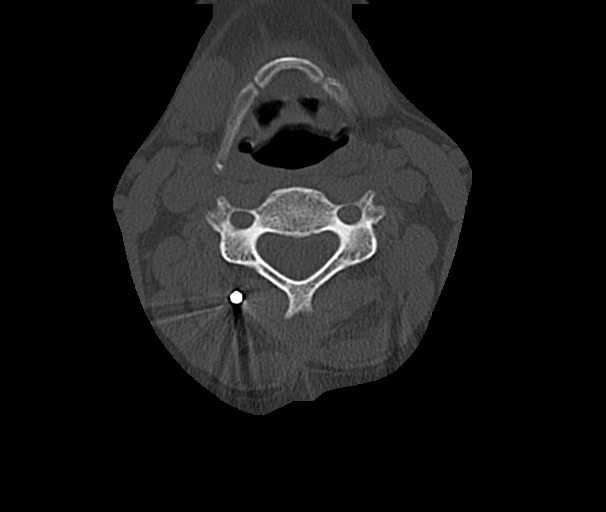
[im 124/145  bone]
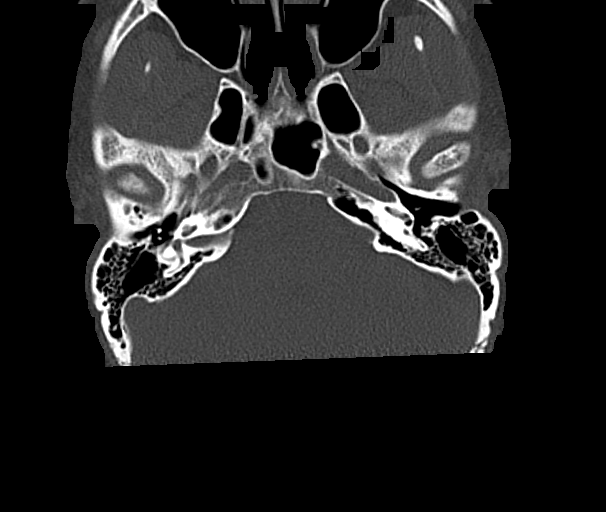

[12 of 33 positions shown; findings below may reference images not displayed]

FINDINGS: Alignment: Reversal of normal cervical lordosis centered at the
level of C4. Alignment is otherwise anatomic.

Skull base and vertebrae: Posterior rod and screw fixation device
extends from the occipital bone to the level of C2. Status post ACDF
at C6-C7 with fully incorporated bone graft at the C6-C7 interspace.
No acute displaced fractures in the cervical spine.

Soft tissues and spinal canal: No prevertebral fluid or swelling. No
visible canal hematoma.

Disc levels: Mild multilevel degenerative disc disease, most
pronounced at C4-C5. No significant facet arthropathy.

Upper chest: Unremarkable.

Other: None.
IMPRESSION: 1. No acute abnormality of the cervical spine.
2. Postoperative changes, as above.

## 2020-10-28 ENCOUNTER — Ambulatory Visit (INDEPENDENT_AMBULATORY_CARE_PROVIDER_SITE_OTHER): Payer: Medicare HMO | Admitting: Psychology

## 2020-10-28 DIAGNOSIS — F603 Borderline personality disorder: Secondary | ICD-10-CM | POA: Diagnosis not present

## 2020-11-03 DIAGNOSIS — Z3141 Encounter for fertility testing: Secondary | ICD-10-CM | POA: Diagnosis not present

## 2020-11-11 DIAGNOSIS — Z3141 Encounter for fertility testing: Secondary | ICD-10-CM | POA: Diagnosis not present

## 2020-11-12 ENCOUNTER — Ambulatory Visit: Payer: Medicare HMO | Admitting: Hospice and Palliative Medicine

## 2020-11-18 ENCOUNTER — Encounter: Payer: Self-pay | Admitting: Hospice and Palliative Medicine

## 2020-11-18 ENCOUNTER — Ambulatory Visit (INDEPENDENT_AMBULATORY_CARE_PROVIDER_SITE_OTHER): Payer: Medicare Other | Admitting: Hospice and Palliative Medicine

## 2020-11-18 ENCOUNTER — Other Ambulatory Visit: Payer: Self-pay

## 2020-11-18 VITALS — BP 125/84 | Temp 97.7°F | Resp 16 | Ht 71.0 in | Wt 188.0 lb

## 2020-11-18 DIAGNOSIS — N979 Female infertility, unspecified: Secondary | ICD-10-CM | POA: Diagnosis not present

## 2020-11-18 DIAGNOSIS — Q796 Ehlers-Danlos syndrome, unspecified: Secondary | ICD-10-CM

## 2020-11-18 DIAGNOSIS — M797 Fibromyalgia: Secondary | ICD-10-CM

## 2020-11-18 DIAGNOSIS — Z7689 Persons encountering health services in other specified circumstances: Secondary | ICD-10-CM
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
## 2020-11-21 ENCOUNTER — Ambulatory Visit (INDEPENDENT_AMBULATORY_CARE_PROVIDER_SITE_OTHER): Payer: Medicare Other | Admitting: Psychology

## 2020-11-21 ENCOUNTER — Encounter: Payer: Medicare HMO | Admitting: Obstetrics and Gynecology

## 2020-11-21 DIAGNOSIS — F603 Borderline personality disorder: Secondary | ICD-10-CM | POA: Diagnosis not present

## 2020-11-25 ENCOUNTER — Ambulatory Visit (INDEPENDENT_AMBULATORY_CARE_PROVIDER_SITE_OTHER): Payer: Medicare Other | Admitting: Obstetrics and Gynecology

## 2020-11-25 ENCOUNTER — Ambulatory Visit (INDEPENDENT_AMBULATORY_CARE_PROVIDER_SITE_OTHER): Payer: Medicare Other | Admitting: Internal Medicine

## 2020-11-25 ENCOUNTER — Encounter: Payer: Self-pay | Admitting: Internal Medicine

## 2020-11-25 ENCOUNTER — Encounter: Payer: Self-pay | Admitting: Obstetrics and Gynecology

## 2020-11-25 ENCOUNTER — Other Ambulatory Visit: Payer: Self-pay

## 2020-11-25 VITALS — Resp 16 | Ht 71.0 in | Wt 187.0 lb

## 2020-11-25 VITALS — BP 108/74 | HR 104 | Ht 71.0 in | Wt 186.7 lb

## 2020-11-25 DIAGNOSIS — Q796 Ehlers-Danlos syndrome, unspecified: Secondary | ICD-10-CM

## 2020-11-25 DIAGNOSIS — K649 Unspecified hemorrhoids: Secondary | ICD-10-CM | POA: Diagnosis not present

## 2020-11-25 DIAGNOSIS — E2839 Other primary ovarian failure: Secondary | ICD-10-CM | POA: Diagnosis not present

## 2020-11-25 DIAGNOSIS — G8929 Other chronic pain: Secondary | ICD-10-CM | POA: Diagnosis not present

## 2020-11-25 DIAGNOSIS — N979 Female infertility, unspecified: Secondary | ICD-10-CM

## 2020-11-25 DIAGNOSIS — Z8759 Personal history of other complications of pregnancy, childbirth and the puerperium: Secondary | ICD-10-CM | POA: Diagnosis not present

## 2020-11-25 DIAGNOSIS — Z319 Encounter for procreative management, unspecified: Secondary | ICD-10-CM

## 2020-11-25 DIAGNOSIS — N926 Irregular menstruation, unspecified: Secondary | ICD-10-CM | POA: Diagnosis not present

## 2020-11-25 DIAGNOSIS — E282 Polycystic ovarian syndrome: Secondary | ICD-10-CM

## 2020-11-25 MED ORDER — NORETHINDRONE ACETATE 5 MG PO TABS: 5.0000 mg | ORAL_TABLET | Freq: Every day | ORAL | 2 refills | Status: DC

## 2020-11-25 MED ORDER — ESTRADIOL 1 MG PO TABS: 1.0000 mg | ORAL_TABLET | Freq: Every day | ORAL | 3 refills | Status: DC

## 2020-11-25 NOTE — Progress Notes (Signed)
Pt present to discuss infertility issues, hair loss, and irregular cycles.

## 2020-11-25 NOTE — Progress Notes (Signed)
[facility] [place], [place] [address]  Internal MEDICINE  Office Visit Note  Patient Name: Stacy West  [id number]  [id number]  Date of Service: 11/28/2020  Chief Complaint  Patient presents with  . Telephone Screen    Pt wants to be called at this number [phone]  . telephone assesment    Covid positive  . Depression  . Headache    HPI   Pt is connected via phone, she is concerned about her refills for her pain meds. She has dx of Ehlers Danlos and fibromyalgia, has been dealing with secondary infertility followed by [facility] for now. Discussion about PCP and getting into a medical practice which has multispecialties for better management and continuity of care.  Current Medication: Outpatient Encounter Medications as of 11/25/2020  Medication Sig  . B Complex Vitamins (VITAMIN-B COMPLEX PO) Take by mouth.  . Cholecalciferol (VITAMIN D3) 75 MCG (3000 UT) TABS Take by mouth.  . cyclobenzaprine (FLEXERIL) 10 MG tablet   . estradiol (ESTRACE) 1 MG tablet Take 1 tablet (1 mg total) by mouth daily. Take 1 tablet daily for 3 weeks, off for 4th week.  . Letrozole (FEMARA PO) Take by mouth.  . metFORMIN (GLUCOPHAGE-XR) 500 MG 24 hr tablet Take 1,000 mg by mouth 2 (two) times daily.  . misoprostol (CYTOTEC) 200 MCG tablet   . norethindrone (AYGESTIN) 5 MG tablet Take 1 tablet (5 mg total) by mouth daily. Take Day 1-10 of cycle  . PRENATAL VIT W/FE-METHYLFOL-FA PO Take by mouth.  Marland Kitchen PROGESTERONE PO Take by mouth. cream   No facility-administered encounter medications on file as of 11/25/2020.    Surgical History: Past Surgical History:  Procedure Laterality Date  . ADENOIDECTOMY    . BACK SURGERY    . COLPOSCOPY    . FINGER SURGERY Left   . NECK SURGERY  07/2020  . TONSILLECTOMY    . WISDOM TOOTH EXTRACTION      Medical History: Past Medical History:  Diagnosis Date  . Abnormal Pap smear of cervix   . Allergy   . Depression   .  Ehlers-Danlos disease   . Fibromyalgia   . Heart murmur   . Insulin resistance   . Miscarriage   . Other cervical disc degeneration at C4-C5 level   . PCOS (polycystic ovarian syndrome)   . PCOS (polycystic ovarian syndrome)   . SI (sacroiliac) joint dysfunction   . Sleep apnea   . Thyroid disease     Family History: Family History  Problem Relation Age of Onset  . Heart murmur Mother   . Asthma Mother   . Skin cancer Father   . COPD Father   . Heart murmur Maternal Aunt   . Breast cancer Maternal Aunt   . Bipolar disorder Brother   . Depression Brother   . Alcoholism Brother   . Depression Brother   . Early menopause Maternal Aunt     Social History   Socioeconomic History  . Marital status: Married    Spouse name: Not on file  . Number of children: 1  . Years of education: Not on file  . Highest education level: Not on file  Occupational History  . Not on file  Tobacco Use  . Smoking status: Former Smoker    Types: Cigarettes    Quit date: 2018    Years since quitting: 4.0  . Smokeless tobacco: Never Used  Vaping Use  . Vaping Use: Never used  Substance and  Sexual Activity  . Alcohol use: Not Currently  . Drug use: Not Currently  . Sexual activity: Yes    Birth control/protection: None  Other Topics Concern  . Not on file  Social History Narrative  . Not on file   Social Determinants of Health   Financial Resource Strain: Not on file  Food Insecurity: Not on file  Transportation Needs: Not on file  Physical Activity: Not on file  Stress: Not on file  Social Connections: Not on file  Intimate Partner Violence: Not on file      Review of Systems Wants refills on Dilaudid   Vital Signs: Resp 16   Ht 5\' 11"  (1.803 m)   Wt 187 lb (84.8 kg)   LMP  (LMP Unknown)   BMI 26.08 kg/m    Physical Exam  NAD   Assessment/Plan: 1. Other chronic pain Interested in pain managment, transfer care to multispecialty group   - Ambulatory referral  to Pain Clinic  General Counseling: airiel oblinger understanding of the findings of todays visit and agrees with plan of treatment. I have discussed any further diagnostic evaluation that may be needed or ordered today. We also reviewed her medications today. she has been encouraged to call the office with any questions or concerns that should arise related to todays visit.    Orders Placed This Encounter  Procedures  . Ambulatory referral to Pain Clinic    Total time spent: 10Minutes Time spent includes review of chart, medications, test results, and follow up plan with the patient.      Dr Lavera Guise Internal medicine
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
## 2020-11-26 LAB — HUMAN CHORIONIC GONADOTROPIN(HCG),B-SUBUNIT,QUANTITATIVE): HCG, Beta Chain, Quant, S: 2 m[IU]/mL

## 2020-11-28 ENCOUNTER — Telehealth: Payer: Self-pay

## 2020-11-28 NOTE — Telephone Encounter (Signed)
Patient has been informed to see [facility] in [place], pt understood and will look into this. Jailine Lieder

## 2020-11-28 NOTE — Telephone Encounter (Signed)
I left a message and asked pt to call back to discuss further appointments for future.Stacy West

## 2020-12-02 ENCOUNTER — Ambulatory Visit (INDEPENDENT_AMBULATORY_CARE_PROVIDER_SITE_OTHER): Payer: Medicare Other | Admitting: Psychology

## 2020-12-02 DIAGNOSIS — F603 Borderline personality disorder: Secondary | ICD-10-CM

## 2020-12-04 ENCOUNTER — Ambulatory Visit: Payer: Medicare HMO | Admitting: General Surgery

## 2020-12-09 ENCOUNTER — Other Ambulatory Visit: Payer: Self-pay

## 2020-12-09 ENCOUNTER — Encounter: Payer: Self-pay | Admitting: General Surgery

## 2020-12-09 ENCOUNTER — Ambulatory Visit (INDEPENDENT_AMBULATORY_CARE_PROVIDER_SITE_OTHER): Payer: Medicare Other | Admitting: General Surgery

## 2020-12-09 VITALS — BP 114/79 | HR 103 | Temp 99.2°F | Ht 71.0 in | Wt 192.8 lb

## 2020-12-09 DIAGNOSIS — E2839 Other primary ovarian failure: Secondary | ICD-10-CM | POA: Diagnosis not present

## 2020-12-09 DIAGNOSIS — K642 Third degree hemorrhoids: Secondary | ICD-10-CM | POA: Diagnosis not present

## 2020-12-09 MED ORDER — LIDOCAINE (ANORECTAL) 5 % EX GEL: CUTANEOUS | 1 refills | Status: DC

## 2020-12-09 NOTE — Patient Instructions (Addendum)
Dr.Cannon discussed with patient the surgical treatment, risk factors and recovery phase at today's visit. Patient will discuss with family and give our office a call back if she decides to proceed with surgical treatment.  Hemorrhoids Hemorrhoids are swollen veins in and around the rectum or anus. There are two types of hemorrhoids:  Internal hemorrhoids. These occur in the veins that are just inside the rectum. They may poke through to the outside and become irritated and painful.  External hemorrhoids. These occur in the veins that are outside the anus and can be felt as a painful swelling or hard lump near the anus. Most hemorrhoids do not cause serious problems, and they can be managed with home treatments such as diet and lifestyle changes. If home treatments do not help the symptoms, procedures can be done to shrink or remove the hemorrhoids. What are the causes? This condition is caused by increased pressure in the anal area. This pressure may result from various things, including:  Constipation.  Straining to have a bowel movement.  Diarrhea.  Pregnancy.  Obesity.  Sitting for long periods of time.  Heavy lifting or other activity that causes you to strain.  Anal sex.  Riding a bike for a long period of time. What are the signs or symptoms? Symptoms of this condition include:  Pain.  Anal itching or irritation.  Rectal bleeding.  Leakage of stool (feces).  Anal swelling.  One or more lumps around the anus. How is this diagnosed? This condition can often be diagnosed through a visual exam. Other exams or tests may also be done, such as:  An exam that involves feeling the rectal area with a gloved hand (digital rectal exam).  An exam of the anal canal that is done using a small tube (anoscope).  A blood test, if you have lost a significant amount of blood.  A test to look inside the colon using a flexible tube with a camera on the end (sigmoidoscopy or  colonoscopy). How is this treated? This condition can usually be treated at home. However, various procedures may be done if dietary changes, lifestyle changes, and other home treatments do not help your symptoms. These procedures can help make the hemorrhoids smaller or remove them completely. Some of these procedures involve surgery, and others do not. Common procedures include:  Rubber band ligation. Rubber bands are placed at the base of the hemorrhoids to cut off their blood supply.  Sclerotherapy. Medicine is injected into the hemorrhoids to shrink them.  Infrared coagulation. A type of light energy is used to get rid of the hemorrhoids.  Hemorrhoidectomy surgery. The hemorrhoids are surgically removed, and the veins that supply them are tied off.  Stapled hemorrhoidopexy surgery. The surgeon staples the base of the hemorrhoid to the rectal wall. Follow these instructions at home: Eating and drinking  Eat foods that have a lot of fiber in them, such as whole grains, beans, nuts, fruits, and vegetables.  Ask your health care provider about taking products that have added fiber (fiber supplements).  Reduce the amount of fat in your diet. You can do this by eating low-fat dairy products, eating less red meat, and avoiding processed foods.  Drink enough fluid to keep your urine pale yellow.   Managing pain and swelling  Take warm sitz baths for 20 minutes, 3-4 times a day to ease pain and discomfort. You may do this in a bathtub or using a portable sitz bath that fits over the toilet.  If directed, apply ice to the affected area. Using ice packs between sitz baths may be helpful. ? Put ice in a plastic bag. ? Place a towel between your skin and the bag. ? Leave the ice on for 20 minutes, 2-3 times a day.   General instructions  Take over-the-counter and prescription medicines only as told by your health care provider.  Use medicated creams or suppositories as told.  Get regular  exercise. Ask your health care provider how much and what kind of exercise is best for you. In general, you should do moderate exercise for at least 30 minutes on most days of the week (150 minutes each week). This can include activities such as walking, biking, or yoga.  Go to the bathroom when you have the urge to have a bowel movement. Do not wait.  Avoid straining to have bowel movements.  Keep the anal area dry and clean. Use wet toilet paper or moist towelettes after a bowel movement.  Do not sit on the toilet for long periods of time. This increases blood pooling and pain.  Keep all follow-up visits as told by your health care provider. This is important. Contact a health care provider if you have:  Increasing pain and swelling that are not controlled by treatment or medicine.  Difficulty having a bowel movement, or you are unable to have a bowel movement.  Pain or inflammation outside the area of the hemorrhoids. Get help right away if you have:  Uncontrolled bleeding from your rectum. Summary  Hemorrhoids are swollen veins in and around the rectum or anus.  Most hemorrhoids can be managed with home treatments such as diet and lifestyle changes.  Taking warm sitz baths can help ease pain and discomfort.  In severe cases, procedures or surgery can be done to shrink or remove the hemorrhoids. This information is not intended to replace advice given to you by your health care provider. Make sure you discuss any questions you have with your health care provider. Document Revised: 03/30/2019 Document Reviewed: 03/23/2018 Elsevier Patient Education  [place].

## 2020-12-10 ENCOUNTER — Other Ambulatory Visit: Payer: Self-pay

## 2020-12-10 ENCOUNTER — Other Ambulatory Visit: Payer: Medicare Other

## 2020-12-10 DIAGNOSIS — Z8759 Personal history of other complications of pregnancy, childbirth and the puerperium: Secondary | ICD-10-CM

## 2020-12-10 DIAGNOSIS — E2839 Other primary ovarian failure: Secondary | ICD-10-CM

## 2020-12-10 DIAGNOSIS — Z319 Encounter for procreative management, unspecified: Secondary | ICD-10-CM

## 2020-12-11 NOTE — Progress Notes (Signed)
Patient ID: Stacy West, female   DOB: 1981-03-24, 40 y.o.   MRN: [id number]  Chief Complaint  Patient presents with  . Other    new pt ref Dr.Cherry hemorrhoids    HPI Stacy West is a 40 y.o. female.   She was referred by Dr. Marcelline Mates for evaluation of hemorrhoid disease.  Stacy West states that she has had hemorrhoids since the birth of her daughter 3 years ago.  She gives a history of having had multiple hemorrhoids removed in a doctor's office in [place], but she states that "he did not get them all."  She denies any history of thrombosed hemorrhoids but says she thinks that one "burst".  She has pain and occasional bleeding when she passes a hard stool.  She does endorse a history of constipation.  She says that she has "tried everything" available to her over-the-counter and nothing helps.  During the interview, she seems predominantly focused on her belief that she has Sheehan syndrome and levies many complaints against other doctors who she feels have not tested her properly and to seem to be ignoring her concerns and her perception.   Past Medical History:  Diagnosis Date  . Abnormal Pap smear of cervix   . Allergy   . Depression   . Ehlers-Danlos disease   . Fibromyalgia   . Heart murmur   . Insulin resistance   . Miscarriage   . Other cervical disc degeneration at C4-C5 level   . PCOS (polycystic ovarian syndrome)   . PCOS (polycystic ovarian syndrome)   . SI (sacroiliac) joint dysfunction   . Sleep apnea   . Thyroid disease     Past Surgical History:  Procedure Laterality Date  . ADENOIDECTOMY    . BACK SURGERY    . COLPOSCOPY    . FINGER SURGERY Left   . NECK SURGERY  07/2020  . TONSILLECTOMY    . WISDOM TOOTH EXTRACTION      Family History  Problem Relation Age of Onset  . Heart murmur Mother   . Asthma Mother   . Skin cancer Father   . COPD Father   . Heart murmur Maternal Aunt   . Breast cancer Maternal Aunt   . Bipolar disorder Brother   .  Depression Brother   . Alcoholism Brother   . Depression Brother   . Early menopause Maternal Aunt     Social History Social History   Tobacco Use  . Smoking status: Former Smoker    Types: Cigarettes    Quit date: 2018    Years since quitting: 4.0  . Smokeless tobacco: Never Used  Vaping Use  . Vaping Use: Never used  Substance Use Topics  . Alcohol use: Not Currently  . Drug use: Not Currently    Allergies  Allergen Reactions  . Carisoprodol-Aspirin-Codeine Hives and Itching  . Carisoprodol Dermatitis  . Percocet  [Oxycodone-Acetaminophen] Dermatitis  . Gluten Meal Hives and Nausea Only    Current Outpatient Medications  Medication Sig Dispense Refill  . B Complex Vitamins (VITAMIN-B COMPLEX PO) Take by mouth.    . Cholecalciferol (VITAMIN D3) 75 MCG (3000 UT) TABS Take by mouth.    . cyclobenzaprine (FLEXERIL) 10 MG tablet     . estradiol (ESTRACE) 1 MG tablet Take 1 tablet (1 mg total) by mouth daily. Take 1 tablet daily for 3 weeks, off for 4th week. 90 tablet 3  . Lidocaine, Anorectal, 5 % GEL Apply thin layer to affected area. As  needed. 30 g 1  . metFORMIN (GLUCOPHAGE-XR) 500 MG 24 hr tablet Take 1,000 mg by mouth 2 (two) times daily.    . misoprostol (CYTOTEC) 200 MCG tablet     . norethindrone (AYGESTIN) 5 MG tablet Take 1 tablet (5 mg total) by mouth daily. Take Day 1-10 of cycle 30 tablet 2  . PRENATAL VIT W/FE-METHYLFOL-FA PO Take by mouth.    Marland Kitchen PROGESTERONE PO Take by mouth. cream    . Letrozole (FEMARA PO) Take by mouth. (Patient not taking: Reported on 12/09/2020)     No current facility-administered medications for this visit.    Review of Systems Review of Systems  Respiratory: Positive for cough and wheezing.   Cardiovascular: Positive for palpitations and leg swelling.  Gastrointestinal: Positive for abdominal pain and nausea.       GERD  Neurological: Positive for numbness and headaches.  All other systems reviewed and are negative.   Blood  pressure 114/79, pulse (!) 103, temperature 99.2 F (37.3 C), temperature source Oral, height 5\' 11"  (1.803 m), weight 192 lb 12.8 oz (87.5 kg), SpO2 95 %, unknown if currently breastfeeding.  Physical Exam Physical Exam Vitals reviewed. Exam conducted with a chaperone present.  Constitutional:      General: She is not in acute distress.    Appearance: Normal appearance.  HENT:     Head: Normocephalic and atraumatic.     Nose:     Comments: Covered with a mask    Mouth/Throat:     Comments: Covered with a mask Eyes:     General: No scleral icterus.       Right eye: No discharge.        Left eye: No discharge.  Cardiovascular:     Rate and Rhythm: Regular rhythm. Tachycardia present.     Pulses: Normal pulses.  Pulmonary:     Effort: Pulmonary effort is normal. No respiratory distress.     Breath sounds: Normal breath sounds.  Abdominal:     General: Bowel sounds are normal.     Palpations: Abdomen is soft.  Genitourinary:      Comments: Multiple external skin tags consistent with prior episodes of hemorrhoidal disease.  There is a small prolapsed external hemorrhoid that is able to be reduced.  No stigmata of recent bleeding. Musculoskeletal:        General: No deformity or signs of injury.     Cervical back: No rigidity.  Lymphadenopathy:     Cervical: No cervical adenopathy.  Skin:    General: Skin is warm and dry.  Neurological:     General: No focal deficit present.     Mental Status: She is alert.  Psychiatric:        Behavior: Behavior normal.     Data Reviewed Via the electronic medical record, I was able to review her clinic visit with Dr. Marcelline Mates, as well as several visits with reproductive endocrinology at [facility].  There are no clinic notes relevant to today's specific visit concern.  Assessment This is a 40 year old woman who has a history of hemorrhoid disease.  She gives a history of having multiple removed in a doctor's  office in [place]; I suspect these were all internal and were likely banded.  She currently has a small external hemorrhoid that is not thrombosed.  She says that she would like to have it removed.  I had an extensive discussion with her regarding the mild nature of her disease and  that hemorrhoid surgery is exquisitely painful.  She said that she would like to think on this further and requested a referral to a local endocrinologist for further evaluation of premature ovarian failure versus Sheehan syndrome.  Plan I prescribed a topical lidocaine ointment to help with her discomfort and will place the requested referral.  I do not think she requires hemorrhoid surgery at this time.  I will see her on an as-needed basis.  Greater than 50% of this 45-minute visit was spent in counseling and coordination of patient care.    Fredirick Maudlin 12/11/2020, 2:37 PM

## 2020-12-15 LAB — INSULIN, FREE AND TOTAL
Free Insulin: 4.5 uU/mL
Total Insulin: 4.5 uU/mL

## 2020-12-15 LAB — PROGESTERONE: Progesterone: 0.7 ng/mL

## 2020-12-15 LAB — ANTI MULLERIAN HORMONE: ANTI-MULLERIAN HORMONE (AMH): <0.015 ng/mL

## 2020-12-15 LAB — FSH/LH
FSH: 46.4 m[IU]/mL
LH: 50.7 m[IU]/mL

## 2020-12-15 LAB — PROLACTIN: Prolactin: 10.9 ng/mL (ref 4.8–23.3)

## 2020-12-15 LAB — TESTOSTERONE, FREE, TOTAL, SHBG
Sex Hormone Binding: 64.8 nmol/L (ref 24.6–122.0)
Testosterone, Free: 5.2 pg/mL — ABNORMAL HIGH (ref 0.0–4.2)
Testosterone: 39 ng/dL (ref 8–60)

## 2020-12-15 LAB — TSH: TSH: 1.06 u[IU]/mL (ref 0.450–4.500)

## 2020-12-15 LAB — CORTISOL: Cortisol: 13 ug/dL

## 2021-01-02 MED ORDER — MEDROXYPROGESTERONE ACETATE 10 MG PO TABS: 10.0000 mg | ORAL_TABLET | Freq: Every day | ORAL | 3 refills | Status: DC

## 2021-01-15 DIAGNOSIS — Z13220 Encounter for screening for lipoid disorders: Secondary | ICD-10-CM | POA: Diagnosis not present

## 2021-01-15 DIAGNOSIS — Z87891 Personal history of nicotine dependence: Secondary | ICD-10-CM | POA: Diagnosis not present

## 2021-01-15 DIAGNOSIS — F331 Major depressive disorder, recurrent, moderate: Secondary | ICD-10-CM | POA: Insufficient documentation

## 2021-01-15 DIAGNOSIS — Z131 Encounter for screening for diabetes mellitus: Secondary | ICD-10-CM | POA: Diagnosis not present

## 2021-01-15 DIAGNOSIS — Q7962 Hypermobile Ehlers-Danlos syndrome: Secondary | ICD-10-CM | POA: Diagnosis not present

## 2021-01-15 DIAGNOSIS — Z1321 Encounter for screening for nutritional disorder: Secondary | ICD-10-CM | POA: Diagnosis not present

## 2021-01-15 DIAGNOSIS — M533 Sacrococcygeal disorders, not elsewhere classified: Secondary | ICD-10-CM | POA: Diagnosis not present

## 2021-01-15 DIAGNOSIS — G8929 Other chronic pain: Secondary | ICD-10-CM | POA: Diagnosis not present

## 2021-01-15 DIAGNOSIS — Z79899 Other long term (current) drug therapy: Secondary | ICD-10-CM | POA: Diagnosis not present

## 2021-01-15 DIAGNOSIS — Z0000 Encounter for general adult medical examination without abnormal findings: Secondary | ICD-10-CM | POA: Diagnosis not present

## 2021-01-27 DIAGNOSIS — M9902 Segmental and somatic dysfunction of thoracic region: Secondary | ICD-10-CM | POA: Diagnosis not present

## 2021-01-27 DIAGNOSIS — M545 Low back pain, unspecified: Secondary | ICD-10-CM | POA: Diagnosis not present

## 2021-01-27 DIAGNOSIS — Q7962 Hypermobile Ehlers-Danlos syndrome: Secondary | ICD-10-CM | POA: Diagnosis not present

## 2021-01-27 DIAGNOSIS — M9908 Segmental and somatic dysfunction of rib cage: Secondary | ICD-10-CM | POA: Diagnosis not present

## 2021-01-27 DIAGNOSIS — M9905 Segmental and somatic dysfunction of pelvic region: Secondary | ICD-10-CM | POA: Diagnosis not present

## 2021-01-27 DIAGNOSIS — M9903 Segmental and somatic dysfunction of lumbar region: Secondary | ICD-10-CM | POA: Diagnosis not present

## 2021-01-30 DIAGNOSIS — L680 Hirsutism: Secondary | ICD-10-CM | POA: Diagnosis not present

## 2021-02-03 ENCOUNTER — Telehealth: Payer: Self-pay | Admitting: Obstetrics and Gynecology

## 2021-02-03 NOTE — Telephone Encounter (Signed)
Wants to know if it is ok to stop estriol medication for 4-6 weeks to re evaluate hormone levels  Pa: [facility] [phone] ext [phone]

## 2021-02-03 NOTE — Telephone Encounter (Signed)
Please advise. Thanks Yony Roulston 

## 2021-02-05 NOTE — Telephone Encounter (Signed)
She can stop them if she desires, however her hormone levels will likely decrease when stopping and she may not have a cycle.

## 2021-02-05 NOTE — Telephone Encounter (Signed)
Spoke to pt and informed her of the information given by [facility]. Pt voiced that she understood and would stop the medication on this Friday.

## 2021-02-16 ENCOUNTER — Other Ambulatory Visit: Payer: Self-pay | Admitting: Obstetrics and Gynecology

## 2021-03-18 DIAGNOSIS — L680 Hirsutism: Secondary | ICD-10-CM | POA: Diagnosis not present

## 2021-03-23 DIAGNOSIS — L680 Hirsutism: Secondary | ICD-10-CM | POA: Diagnosis not present

## 2021-04-16 DIAGNOSIS — M5412 Radiculopathy, cervical region: Secondary | ICD-10-CM | POA: Diagnosis not present

## 2021-04-16 DIAGNOSIS — M9902 Segmental and somatic dysfunction of thoracic region: Secondary | ICD-10-CM | POA: Diagnosis not present

## 2021-04-16 DIAGNOSIS — R519 Headache, unspecified: Secondary | ICD-10-CM | POA: Diagnosis not present

## 2021-04-16 DIAGNOSIS — M9901 Segmental and somatic dysfunction of cervical region: Secondary | ICD-10-CM | POA: Diagnosis not present

## 2021-04-17 DIAGNOSIS — M9902 Segmental and somatic dysfunction of thoracic region: Secondary | ICD-10-CM | POA: Diagnosis not present

## 2021-04-17 DIAGNOSIS — M5412 Radiculopathy, cervical region: Secondary | ICD-10-CM | POA: Diagnosis not present

## 2021-04-17 DIAGNOSIS — M9901 Segmental and somatic dysfunction of cervical region: Secondary | ICD-10-CM | POA: Diagnosis not present

## 2021-04-17 DIAGNOSIS — Z8669 Personal history of other diseases of the nervous system and sense organs: Secondary | ICD-10-CM | POA: Diagnosis not present

## 2021-04-17 DIAGNOSIS — M533 Sacrococcygeal disorders, not elsewhere classified: Secondary | ICD-10-CM | POA: Diagnosis not present

## 2021-04-17 DIAGNOSIS — R519 Headache, unspecified: Secondary | ICD-10-CM | POA: Diagnosis not present

## 2021-04-17 DIAGNOSIS — G8929 Other chronic pain: Secondary | ICD-10-CM | POA: Diagnosis not present

## 2021-04-20 DIAGNOSIS — R519 Headache, unspecified: Secondary | ICD-10-CM | POA: Diagnosis not present

## 2021-04-20 DIAGNOSIS — M5412 Radiculopathy, cervical region: Secondary | ICD-10-CM | POA: Diagnosis not present

## 2021-04-20 DIAGNOSIS — M9902 Segmental and somatic dysfunction of thoracic region: Secondary | ICD-10-CM | POA: Diagnosis not present

## 2021-04-20 DIAGNOSIS — M9901 Segmental and somatic dysfunction of cervical region: Secondary | ICD-10-CM | POA: Diagnosis not present

## 2021-04-22 DIAGNOSIS — M9901 Segmental and somatic dysfunction of cervical region: Secondary | ICD-10-CM | POA: Diagnosis not present

## 2021-04-22 DIAGNOSIS — M9902 Segmental and somatic dysfunction of thoracic region: Secondary | ICD-10-CM | POA: Diagnosis not present

## 2021-04-22 DIAGNOSIS — M5412 Radiculopathy, cervical region: Secondary | ICD-10-CM | POA: Diagnosis not present

## 2021-04-22 DIAGNOSIS — R519 Headache, unspecified: Secondary | ICD-10-CM | POA: Diagnosis not present

## 2021-04-23 ENCOUNTER — Telehealth: Payer: Self-pay | Admitting: Obstetrics and Gynecology

## 2021-04-23 NOTE — Telephone Encounter (Signed)
Tiasha called in and states she just found out she is pregnant.  Stacy West states she is very high risk and would like to speak with Dr. Marcelline Mates as soon as possible.  I did offer [place] Dr. Andreas Blower first available appointment which was on Tuesday June 14th.  We put her on the schedule for that date but Senaya is really concerned and is just full of anxiety and really wants Dr. Marcelline Mates to reach out to her.  Bobbye stated she would be ok if it was a phone call and/or MyChart.

## 2021-04-24 ENCOUNTER — Telehealth: Payer: Self-pay | Admitting: Obstetrics and Gynecology

## 2021-04-24 DIAGNOSIS — M5412 Radiculopathy, cervical region: Secondary | ICD-10-CM | POA: Diagnosis not present

## 2021-04-24 DIAGNOSIS — M9902 Segmental and somatic dysfunction of thoracic region: Secondary | ICD-10-CM | POA: Diagnosis not present

## 2021-04-24 DIAGNOSIS — R519 Headache, unspecified: Secondary | ICD-10-CM | POA: Diagnosis not present

## 2021-04-24 DIAGNOSIS — M9901 Segmental and somatic dysfunction of cervical region: Secondary | ICD-10-CM | POA: Diagnosis not present

## 2021-04-24 NOTE — Telephone Encounter (Signed)
Pt called she has an apt 06-14 she is asking if she can get labs performed prior- to check levels. Please Advise.

## 2021-04-27 DIAGNOSIS — M9902 Segmental and somatic dysfunction of thoracic region: Secondary | ICD-10-CM | POA: Diagnosis not present

## 2021-04-27 DIAGNOSIS — R519 Headache, unspecified: Secondary | ICD-10-CM | POA: Diagnosis not present

## 2021-04-27 DIAGNOSIS — M9901 Segmental and somatic dysfunction of cervical region: Secondary | ICD-10-CM | POA: Diagnosis not present

## 2021-04-27 DIAGNOSIS — M5412 Radiculopathy, cervical region: Secondary | ICD-10-CM | POA: Diagnosis not present

## 2021-04-28 ENCOUNTER — Encounter: Payer: Self-pay | Admitting: Obstetrics and Gynecology

## 2021-04-28 ENCOUNTER — Ambulatory Visit (INDEPENDENT_AMBULATORY_CARE_PROVIDER_SITE_OTHER): Payer: Medicare Other | Admitting: Obstetrics and Gynecology

## 2021-04-28 ENCOUNTER — Other Ambulatory Visit: Payer: Self-pay | Admitting: Obstetrics and Gynecology

## 2021-04-28 ENCOUNTER — Other Ambulatory Visit: Payer: Self-pay

## 2021-04-28 VITALS — BP 125/79 | HR 102 | Ht 71.0 in | Wt 185.3 lb

## 2021-04-28 DIAGNOSIS — E230 Hypopituitarism: Secondary | ICD-10-CM

## 2021-04-28 DIAGNOSIS — Q796 Ehlers-Danlos syndrome, unspecified: Secondary | ICD-10-CM

## 2021-04-28 DIAGNOSIS — N926 Irregular menstruation, unspecified: Secondary | ICD-10-CM

## 2021-04-28 DIAGNOSIS — E282 Polycystic ovarian syndrome: Secondary | ICD-10-CM | POA: Diagnosis not present

## 2021-04-28 DIAGNOSIS — E2839 Other primary ovarian failure: Secondary | ICD-10-CM

## 2021-04-28 LAB — POCT URINE PREGNANCY: Preg Test, Ur: NEGATIVE

## 2021-04-28 NOTE — Progress Notes (Signed)
PT is present today for confirmation of pregnancy. Pt LMP 03/17/2021. UPT done today results were neg. Pt stated that she is doing well no complaints.

## 2021-04-28 NOTE — Progress Notes (Signed)
    GYNECOLOGY PROGRESS NOTE Subjective:    Stacy West is a 40 y.o. [id number] who presents for evaluation of amenorrhea.  Her husband is present for today's visit.  She believes she could be pregnant. Pregnancy is desired. Sexual Activity: single partner, contraception: none. Current symptoms also include: positive home pregnancy test (x 2, faint line). Last period was normal.  Patient's last menstrual period was 03/17/2021.  Of note, patient has a history of prior premature ovarian insufficiency, Ehlers-Danlos syndrome, and PCOS.  She has seen several REI and endocrinology specialists in the past.  Currently notes that after stopping all of her supplements and hormones that she now has had 3 spontaneous menstrual cycles to date.  She has been trying to become pregnant for some time now.  The following portions of the patient's history were reviewed and updated as appropriate: allergies, current medications, past family history, past medical history, past social history, past surgical history, and problem list.  Review of Systems Pertinent items noted in HPI and remainder of comprehensive ROS otherwise negative.     Objective:    BP 125/79   Pulse (!) 102   Ht 5\' 11"  (1.803 m)   Wt 185 lb 4.8 oz (84.1 kg)   LMP 03/17/2021   BMI 25.84 kg/m  General: alert, no distress, and no acute distress    Lab Review Urine HCG: negative    Assessment:   1. Missed menses   2. Secondary pituitary hypothalamic infertility ([place])   3. PCOS (polycystic ovarian syndrome)   4. Ehlers-Danlos syndrome   5. Premature ovarian insufficiency     Plan:   - Pregnancy Test: Negative: Patient obviously noted to be disappointed.  Has questions as to how she could have positive home pregnancy test but negative office test.  Offered to perform hormonal hCG level to confirm presence or absence of a pregnancy.  Patient also desires her hormone levels to be rechecked including her AMH as she has now  spontaneously had several cycles in spite of her diagnosis of ovarian insufficiency and PCOS.  -Answered all patient's questions, including her diagnosis of PCOS, premature ovarian insufficiency.  Also discussed her use of supplements.  Reviewed most recent labs from March from her new endocrinologist.  Advised that if her [place] levels have increased then she could consider use of ovulation induction medications again such as Clomid or Femara.  Patient notes that she had her last baby with use of metformin.  Can also utilize metformin with fertility medications if desired.    A total of 25 minutes were spent face-to-face with the patient during this encounter and over half of that time involved counseling and coordination of care.    Rubie Maid, MD [facility]

## 2021-04-29 ENCOUNTER — Encounter: Payer: Self-pay | Admitting: Obstetrics and Gynecology

## 2021-04-29 DIAGNOSIS — M9901 Segmental and somatic dysfunction of cervical region: Secondary | ICD-10-CM | POA: Diagnosis not present

## 2021-04-29 DIAGNOSIS — M5412 Radiculopathy, cervical region: Secondary | ICD-10-CM | POA: Diagnosis not present

## 2021-04-29 DIAGNOSIS — R519 Headache, unspecified: Secondary | ICD-10-CM | POA: Diagnosis not present

## 2021-04-29 DIAGNOSIS — M9902 Segmental and somatic dysfunction of thoracic region: Secondary | ICD-10-CM | POA: Diagnosis not present

## 2021-04-30 DIAGNOSIS — M9901 Segmental and somatic dysfunction of cervical region: Secondary | ICD-10-CM | POA: Diagnosis not present

## 2021-04-30 DIAGNOSIS — M9902 Segmental and somatic dysfunction of thoracic region: Secondary | ICD-10-CM | POA: Diagnosis not present

## 2021-04-30 DIAGNOSIS — R519 Headache, unspecified: Secondary | ICD-10-CM | POA: Diagnosis not present

## 2021-04-30 DIAGNOSIS — M5412 Radiculopathy, cervical region: Secondary | ICD-10-CM | POA: Diagnosis not present

## 2021-05-01 DIAGNOSIS — M9902 Segmental and somatic dysfunction of thoracic region: Secondary | ICD-10-CM | POA: Diagnosis not present

## 2021-05-01 DIAGNOSIS — M5412 Radiculopathy, cervical region: Secondary | ICD-10-CM | POA: Diagnosis not present

## 2021-05-01 DIAGNOSIS — M9901 Segmental and somatic dysfunction of cervical region: Secondary | ICD-10-CM | POA: Diagnosis not present

## 2021-05-01 DIAGNOSIS — R519 Headache, unspecified: Secondary | ICD-10-CM | POA: Diagnosis not present

## 2021-05-02 DIAGNOSIS — M5412 Radiculopathy, cervical region: Secondary | ICD-10-CM | POA: Diagnosis not present

## 2021-05-02 DIAGNOSIS — R519 Headache, unspecified: Secondary | ICD-10-CM | POA: Diagnosis not present

## 2021-05-02 DIAGNOSIS — M9902 Segmental and somatic dysfunction of thoracic region: Secondary | ICD-10-CM | POA: Diagnosis not present

## 2021-05-02 DIAGNOSIS — M9901 Segmental and somatic dysfunction of cervical region: Secondary | ICD-10-CM | POA: Diagnosis not present

## 2021-05-02 LAB — FSH/LH
FSH: 33.7 m[IU]/mL
LH: 32.5 m[IU]/mL

## 2021-05-02 LAB — ESTRADIOL: Estradiol: 77.6 pg/mL

## 2021-05-02 LAB — PROLACTIN: Prolactin: 11.9 ng/mL (ref 4.8–23.3)

## 2021-05-02 LAB — BETA HCG QUANT (REF LAB): hCG Quant: 1 m[IU]/mL

## 2021-05-02 LAB — PROGESTERONE: Progesterone: 4.7 ng/mL

## 2021-05-02 LAB — ANTI MULLERIAN HORMONE: ANTI-MULLERIAN HORMONE (AMH): <0.015 ng/mL

## 2021-05-05 DIAGNOSIS — M9902 Segmental and somatic dysfunction of thoracic region: Secondary | ICD-10-CM | POA: Diagnosis not present

## 2021-05-05 DIAGNOSIS — M5412 Radiculopathy, cervical region: Secondary | ICD-10-CM | POA: Diagnosis not present

## 2021-05-05 DIAGNOSIS — R519 Headache, unspecified: Secondary | ICD-10-CM | POA: Diagnosis not present

## 2021-05-05 DIAGNOSIS — M9901 Segmental and somatic dysfunction of cervical region: Secondary | ICD-10-CM | POA: Diagnosis not present

## 2021-05-06 LAB — TESTOSTERONE, FREE, TOTAL, SHBG
Sex Hormone Binding: 57.8 nmol/L (ref 24.6–122.0)
Testosterone, Free: 1.5 pg/mL (ref 0.0–4.2)
Testosterone: 28 ng/dL (ref 8–60)

## 2021-05-06 LAB — DHEA-SULFATE: DHEA-SO4: 363 ug/dL — ABNORMAL HIGH (ref 57.3–279.2)

## 2021-05-06 LAB — SPECIMEN STATUS REPORT

## 2021-05-06 LAB — 17-HYDROXYPROGESTERONE: 17-Hydroxyprogesterone: 77 ng/dL

## 2021-05-07 DIAGNOSIS — K219 Gastro-esophageal reflux disease without esophagitis: Secondary | ICD-10-CM | POA: Insufficient documentation

## 2021-05-07 DIAGNOSIS — M9901 Segmental and somatic dysfunction of cervical region: Secondary | ICD-10-CM | POA: Diagnosis not present

## 2021-05-07 DIAGNOSIS — M5412 Radiculopathy, cervical region: Secondary | ICD-10-CM | POA: Diagnosis not present

## 2021-05-07 DIAGNOSIS — M9902 Segmental and somatic dysfunction of thoracic region: Secondary | ICD-10-CM | POA: Diagnosis not present

## 2021-05-07 DIAGNOSIS — R519 Headache, unspecified: Secondary | ICD-10-CM | POA: Diagnosis not present

## 2021-05-12 DIAGNOSIS — R519 Headache, unspecified: Secondary | ICD-10-CM | POA: Diagnosis not present

## 2021-05-12 DIAGNOSIS — M9902 Segmental and somatic dysfunction of thoracic region: Secondary | ICD-10-CM | POA: Diagnosis not present

## 2021-05-12 DIAGNOSIS — M5412 Radiculopathy, cervical region: Secondary | ICD-10-CM | POA: Diagnosis not present

## 2021-05-12 DIAGNOSIS — M9901 Segmental and somatic dysfunction of cervical region: Secondary | ICD-10-CM | POA: Diagnosis not present

## 2021-05-14 DIAGNOSIS — M9902 Segmental and somatic dysfunction of thoracic region: Secondary | ICD-10-CM | POA: Diagnosis not present

## 2021-05-14 DIAGNOSIS — M9901 Segmental and somatic dysfunction of cervical region: Secondary | ICD-10-CM | POA: Diagnosis not present

## 2021-05-14 DIAGNOSIS — R519 Headache, unspecified: Secondary | ICD-10-CM | POA: Diagnosis not present

## 2021-05-14 DIAGNOSIS — M5412 Radiculopathy, cervical region: Secondary | ICD-10-CM | POA: Diagnosis not present

## 2021-05-19 DIAGNOSIS — M9901 Segmental and somatic dysfunction of cervical region: Secondary | ICD-10-CM | POA: Diagnosis not present

## 2021-05-19 DIAGNOSIS — M5412 Radiculopathy, cervical region: Secondary | ICD-10-CM | POA: Diagnosis not present

## 2021-05-19 DIAGNOSIS — R519 Headache, unspecified: Secondary | ICD-10-CM | POA: Diagnosis not present

## 2021-05-19 DIAGNOSIS — M9902 Segmental and somatic dysfunction of thoracic region: Secondary | ICD-10-CM | POA: Diagnosis not present

## 2021-05-26 DIAGNOSIS — M5412 Radiculopathy, cervical region: Secondary | ICD-10-CM | POA: Diagnosis not present

## 2021-05-26 DIAGNOSIS — M9901 Segmental and somatic dysfunction of cervical region: Secondary | ICD-10-CM | POA: Diagnosis not present

## 2021-05-26 DIAGNOSIS — M9902 Segmental and somatic dysfunction of thoracic region: Secondary | ICD-10-CM | POA: Diagnosis not present

## 2021-05-26 DIAGNOSIS — R519 Headache, unspecified: Secondary | ICD-10-CM | POA: Diagnosis not present

## 2021-06-02 DIAGNOSIS — M5412 Radiculopathy, cervical region: Secondary | ICD-10-CM | POA: Diagnosis not present

## 2021-06-02 DIAGNOSIS — M9902 Segmental and somatic dysfunction of thoracic region: Secondary | ICD-10-CM | POA: Diagnosis not present

## 2021-06-02 DIAGNOSIS — R519 Headache, unspecified: Secondary | ICD-10-CM | POA: Diagnosis not present

## 2021-06-02 DIAGNOSIS — M9901 Segmental and somatic dysfunction of cervical region: Secondary | ICD-10-CM | POA: Diagnosis not present

## 2021-06-09 DIAGNOSIS — M9902 Segmental and somatic dysfunction of thoracic region: Secondary | ICD-10-CM | POA: Diagnosis not present

## 2021-06-09 DIAGNOSIS — R519 Headache, unspecified: Secondary | ICD-10-CM | POA: Diagnosis not present

## 2021-06-09 DIAGNOSIS — M9901 Segmental and somatic dysfunction of cervical region: Secondary | ICD-10-CM | POA: Diagnosis not present

## 2021-06-09 DIAGNOSIS — M5412 Radiculopathy, cervical region: Secondary | ICD-10-CM | POA: Diagnosis not present

## 2021-06-16 DIAGNOSIS — M9902 Segmental and somatic dysfunction of thoracic region: Secondary | ICD-10-CM | POA: Diagnosis not present

## 2021-06-16 DIAGNOSIS — R519 Headache, unspecified: Secondary | ICD-10-CM | POA: Diagnosis not present

## 2021-06-16 DIAGNOSIS — M5412 Radiculopathy, cervical region: Secondary | ICD-10-CM | POA: Diagnosis not present

## 2021-06-16 DIAGNOSIS — M9901 Segmental and somatic dysfunction of cervical region: Secondary | ICD-10-CM | POA: Diagnosis not present

## 2021-06-18 DIAGNOSIS — R519 Headache, unspecified: Secondary | ICD-10-CM | POA: Diagnosis not present

## 2021-06-18 DIAGNOSIS — M9901 Segmental and somatic dysfunction of cervical region: Secondary | ICD-10-CM | POA: Diagnosis not present

## 2021-06-18 DIAGNOSIS — M9902 Segmental and somatic dysfunction of thoracic region: Secondary | ICD-10-CM | POA: Diagnosis not present

## 2021-06-18 DIAGNOSIS — M5412 Radiculopathy, cervical region: Secondary | ICD-10-CM | POA: Diagnosis not present

## 2021-06-22 ENCOUNTER — Ambulatory Visit (INDEPENDENT_AMBULATORY_CARE_PROVIDER_SITE_OTHER): Payer: Medicare Other | Admitting: Family Medicine

## 2021-06-22 ENCOUNTER — Other Ambulatory Visit: Payer: Self-pay

## 2021-06-22 ENCOUNTER — Encounter: Payer: Self-pay | Admitting: Family Medicine

## 2021-06-22 DIAGNOSIS — M9904 Segmental and somatic dysfunction of sacral region: Secondary | ICD-10-CM | POA: Diagnosis not present

## 2021-06-22 DIAGNOSIS — M999 Biomechanical lesion, unspecified: Secondary | ICD-10-CM

## 2021-06-22 DIAGNOSIS — Q7962 Hypermobile Ehlers-Danlos syndrome: Secondary | ICD-10-CM

## 2021-06-22 NOTE — Assessment & Plan Note (Signed)
Patient does have significant hypermobility noted.  Patient is seen multiple different providers.  Has had difficulty with in pregnancy for quite some time patient is continuing to attempt to get pregnant and is working with an OB/GYN.  Patient has had a normal echo and continues to follow-up with cardiology.  Patient is going to be seeing ophthalmology in the near future as well.  We did discuss the potential for genetic counseling.  Attempted osteopathic manipulation mostly mild muscle energy and FPR techniques with patient having significant range of motion.  With fusion of the neck only did some very mild stretching.  Patient did respond somewhat.  Patient has some muscle relaxers for breakthrough.  Patient given home exercises to work significantly on hip abductor strengthening.  Follow-up with me again in 4 to 6 weeks

## 2021-06-22 NOTE — Progress Notes (Signed)
[place] Phone: [phone] Subjective:   Stacy Stacy West, am serving as a scribe for Dr. Hulan Saas.  This visit occurred during the SARS-CoV-2 public health emergency.  Safety protocols were in place, including screening questions prior to the visit, additional usage of staff PPE, and extensive cleaning of exam room while observing appropriate contact time as indicated for disinfecting solutions.    I'm seeing this patient by the request  of:  Pcp, Stacy West  CC: Connective tissue disorder  [id number]  Stacy Stacy West is a 40 y.o. female coming in with complaint of back pain. Patient is going to chiropractor 2x. Has fusion of C1-2, C4-5 and is suppose to have left SI joint fusion but found out she was pregnant. Has tried PT previously and would like to start that back up again. Does use flexeril QHS.    Past medical history is significant for Sheehan syndrome as well as PCOS  Patient is seen other sports medicine providers previously and we did review last note in March 2022.  Patient has had physical therapy including pelvic floor physical therapy, injections, home rehab and exercises as well as chiropractic care.  She is also seen other providers including another provider for osteopathic manipulation.  Was seen in rheumatology it appears the patient has seen cardiology and has had a 30-day monitor but I do not see echocardiogram.  Patient does have a history of mitral valve prolapse  Did review patient's previous MRI of the cervical brain and neck in November 2018 showing Stacy West vascular compromise at that time  Past Medical History:  Diagnosis Date   Abnormal Pap smear of cervix    Allergy    Depression    Ehlers-Danlos disease    Fibromyalgia    Heart murmur    Insulin resistance    Miscarriage    Other cervical disc degeneration at C4-C5 level    PCOS (polycystic ovarian syndrome)    PCOS (polycystic  ovarian syndrome)    SI (sacroiliac) joint dysfunction    Sleep apnea    Thyroid disease    Past Surgical History:  Procedure Laterality Date   ADENOIDECTOMY     BACK SURGERY     COLPOSCOPY     FINGER SURGERY Left    NECK SURGERY  07/2020   TONSILLECTOMY     WISDOM TOOTH EXTRACTION     Social History   Socioeconomic History   Marital status: Married    Spouse name: Not on file   Number of children: 1   Years of education: Not on file   Highest education level: Not on file  Occupational History   Not on file  Tobacco Use   Smoking status: Former    Types: Cigarettes    Quit date: 2018    Years since quitting: 4.6   Smokeless tobacco: Never  Vaping Use   Vaping Use: Never used  Substance and Sexual Activity   Alcohol use: Not Currently   Drug use: Not Currently   Sexual activity: Yes    Birth control/protection: None  Other Topics Concern   Not on file  Social History Narrative   Not on file   Social Determinants of Health   Financial Resource Strain: Not on file  Food Insecurity: Not on file  Transportation Needs: Not on file  Physical Activity: Not on file  Stress: Not on file  Social Connections: Not on file   Allergies  Allergen Reactions  Carisoprodol-Aspirin-Codeine Hives and Itching   Carisoprodol Dermatitis   Percocet  [Oxycodone-Acetaminophen] Dermatitis   Gluten Meal Hives and Nausea Only   Family History  Problem Relation Age of Onset   Heart murmur Mother    Asthma Mother    Skin cancer Father    COPD Father    Heart murmur Maternal Aunt    Breast cancer Maternal Aunt    Bipolar disorder Brother    Depression Brother    Alcoholism Brother    Depression Brother    Early menopause Maternal Aunt          Current Outpatient Medications (Other):    cyclobenzaprine (FLEXERIL) 10 MG tablet,    PRENATAL VIT W/FE-METHYLFOL-FA PO, Take by mouth.   Reviewed prior external information including notes and imaging from  primary care  provider As well as notes that were available from care everywhere and other healthcare systems.  Past medical history, social, surgical and family history all reviewed in electronic medical record.  Stacy [name] unless stated regarding to the chief complaint.   Review of Systems:  Stacy West headache, visual changes, nausea, vomiting, diarrhea, constipation, dizziness, abdominal pain, skin rash, fevers, chills, night sweats, weight loss, swollen lymph nodes,  chest pain, shortness of breath, mood changes. POSITIVE muscle aches, body aches, joint pain  Objective  Blood pressure 120/84, pulse 60, height '5\' 11"'$  (1.803 m), weight 183 lb (83 kg), SpO2 99 %, unknown if currently breastfeeding.   General: Stacy West apparent distress alert and oriented x3 mood and affect normal, dressed appropriately.  Patient does seem a little anxious HEENT: Pupils equal, extraocular movements intact  Respiratory: Patient's speak in full sentences and does not appear short of breath  Cardiovascular: Stacy West lower extremity edema, non tender, Stacy West erythema  Gait normal with good balance and coordination.  MSK: Patient has a very significantly high Beighton score with significant hypermobility of multiple joints. Patient's low back does have significant tenderness over the sacroiliac joint bilaterally.  Stacy West spinous process tenderness noted though.  Patient does have tightness noted though of the hip flexors.  Does have weakness noted of the hip abductors that is fairly significant.  Osteopathic findings T6 extended rotated and side bent right L4 flexed rotated and side bent right Sacrum left on left   Impression and Recommendations:     The above documentation has been reviewed and is accurate and complete Lyndal Pulley, DO

## 2021-06-22 NOTE — Assessment & Plan Note (Signed)
   Decision today to treat with OMT was based on Physical Exam  After verbal consent patient was treated with  ME, FPR techniques in  thoracic, lumbar and sacral areas, all areas are chronic   Patient tolerated the procedure well with improvement in symptoms  Patient given exercises, stretches and lifestyle modifications  See medications in patient instructions if given  Patient will follow up in 8 weeks

## 2021-06-22 NOTE — Patient Instructions (Signed)
Good to see you Continue with chiro Hip strengthening Tried OMT Tart cherry extract '1200mg'$  at night See me in 6-8 weeks

## 2021-06-23 DIAGNOSIS — M9902 Segmental and somatic dysfunction of thoracic region: Secondary | ICD-10-CM | POA: Diagnosis not present

## 2021-06-23 DIAGNOSIS — R519 Headache, unspecified: Secondary | ICD-10-CM | POA: Diagnosis not present

## 2021-06-23 DIAGNOSIS — M5412 Radiculopathy, cervical region: Secondary | ICD-10-CM | POA: Diagnosis not present

## 2021-06-23 DIAGNOSIS — M9901 Segmental and somatic dysfunction of cervical region: Secondary | ICD-10-CM | POA: Diagnosis not present

## 2021-06-25 DIAGNOSIS — M9901 Segmental and somatic dysfunction of cervical region: Secondary | ICD-10-CM | POA: Diagnosis not present

## 2021-06-25 DIAGNOSIS — M9902 Segmental and somatic dysfunction of thoracic region: Secondary | ICD-10-CM | POA: Diagnosis not present

## 2021-06-25 DIAGNOSIS — M5412 Radiculopathy, cervical region: Secondary | ICD-10-CM | POA: Diagnosis not present

## 2021-06-25 DIAGNOSIS — R519 Headache, unspecified: Secondary | ICD-10-CM | POA: Diagnosis not present

## 2021-06-30 DIAGNOSIS — M5412 Radiculopathy, cervical region: Secondary | ICD-10-CM | POA: Diagnosis not present

## 2021-06-30 DIAGNOSIS — R519 Headache, unspecified: Secondary | ICD-10-CM | POA: Diagnosis not present

## 2021-06-30 DIAGNOSIS — M9902 Segmental and somatic dysfunction of thoracic region: Secondary | ICD-10-CM | POA: Diagnosis not present

## 2021-06-30 DIAGNOSIS — M9901 Segmental and somatic dysfunction of cervical region: Secondary | ICD-10-CM | POA: Diagnosis not present

## 2021-07-02 DIAGNOSIS — M9901 Segmental and somatic dysfunction of cervical region: Secondary | ICD-10-CM | POA: Diagnosis not present

## 2021-07-02 DIAGNOSIS — M9902 Segmental and somatic dysfunction of thoracic region: Secondary | ICD-10-CM | POA: Diagnosis not present

## 2021-07-02 DIAGNOSIS — R519 Headache, unspecified: Secondary | ICD-10-CM | POA: Diagnosis not present

## 2021-07-02 DIAGNOSIS — M5412 Radiculopathy, cervical region: Secondary | ICD-10-CM | POA: Diagnosis not present

## 2021-07-06 DIAGNOSIS — I341 Nonrheumatic mitral (valve) prolapse: Secondary | ICD-10-CM | POA: Diagnosis not present

## 2021-07-06 DIAGNOSIS — Q796 Ehlers-Danlos syndrome, unspecified: Secondary | ICD-10-CM | POA: Diagnosis not present

## 2021-07-07 DIAGNOSIS — M9902 Segmental and somatic dysfunction of thoracic region: Secondary | ICD-10-CM | POA: Diagnosis not present

## 2021-07-07 DIAGNOSIS — M5412 Radiculopathy, cervical region: Secondary | ICD-10-CM | POA: Diagnosis not present

## 2021-07-07 DIAGNOSIS — M9901 Segmental and somatic dysfunction of cervical region: Secondary | ICD-10-CM | POA: Diagnosis not present

## 2021-07-07 DIAGNOSIS — R519 Headache, unspecified: Secondary | ICD-10-CM | POA: Diagnosis not present

## 2021-07-09 DIAGNOSIS — R519 Headache, unspecified: Secondary | ICD-10-CM | POA: Diagnosis not present

## 2021-07-09 DIAGNOSIS — M5412 Radiculopathy, cervical region: Secondary | ICD-10-CM | POA: Diagnosis not present

## 2021-07-09 DIAGNOSIS — M9901 Segmental and somatic dysfunction of cervical region: Secondary | ICD-10-CM | POA: Diagnosis not present

## 2021-07-09 DIAGNOSIS — G4733 Obstructive sleep apnea (adult) (pediatric): Secondary | ICD-10-CM | POA: Diagnosis not present

## 2021-07-09 DIAGNOSIS — M9902 Segmental and somatic dysfunction of thoracic region: Secondary | ICD-10-CM | POA: Diagnosis not present

## 2021-07-13 DIAGNOSIS — R002 Palpitations: Secondary | ICD-10-CM | POA: Diagnosis not present

## 2021-07-13 DIAGNOSIS — I341 Nonrheumatic mitral (valve) prolapse: Secondary | ICD-10-CM | POA: Diagnosis not present

## 2021-07-13 DIAGNOSIS — Q796 Ehlers-Danlos syndrome, unspecified: Secondary | ICD-10-CM | POA: Diagnosis not present

## 2021-07-14 DIAGNOSIS — M9901 Segmental and somatic dysfunction of cervical region: Secondary | ICD-10-CM | POA: Diagnosis not present

## 2021-07-14 DIAGNOSIS — M9902 Segmental and somatic dysfunction of thoracic region: Secondary | ICD-10-CM | POA: Diagnosis not present

## 2021-07-14 DIAGNOSIS — M533 Sacrococcygeal disorders, not elsewhere classified: Secondary | ICD-10-CM | POA: Diagnosis not present

## 2021-07-14 DIAGNOSIS — M5412 Radiculopathy, cervical region: Secondary | ICD-10-CM | POA: Diagnosis not present

## 2021-07-14 DIAGNOSIS — M797 Fibromyalgia: Secondary | ICD-10-CM | POA: Diagnosis not present

## 2021-07-14 DIAGNOSIS — R519 Headache, unspecified: Secondary | ICD-10-CM | POA: Diagnosis not present

## 2021-07-14 DIAGNOSIS — Q7962 Hypermobile Ehlers-Danlos syndrome: Secondary | ICD-10-CM | POA: Diagnosis not present

## 2021-07-15 DIAGNOSIS — M5412 Radiculopathy, cervical region: Secondary | ICD-10-CM | POA: Diagnosis not present

## 2021-07-15 DIAGNOSIS — R519 Headache, unspecified: Secondary | ICD-10-CM | POA: Diagnosis not present

## 2021-07-15 DIAGNOSIS — M9902 Segmental and somatic dysfunction of thoracic region: Secondary | ICD-10-CM | POA: Diagnosis not present

## 2021-07-15 DIAGNOSIS — M9901 Segmental and somatic dysfunction of cervical region: Secondary | ICD-10-CM | POA: Diagnosis not present

## 2021-07-16 DIAGNOSIS — M9902 Segmental and somatic dysfunction of thoracic region: Secondary | ICD-10-CM | POA: Diagnosis not present

## 2021-07-16 DIAGNOSIS — R519 Headache, unspecified: Secondary | ICD-10-CM | POA: Diagnosis not present

## 2021-07-16 DIAGNOSIS — M5412 Radiculopathy, cervical region: Secondary | ICD-10-CM | POA: Diagnosis not present

## 2021-07-16 DIAGNOSIS — M9901 Segmental and somatic dysfunction of cervical region: Secondary | ICD-10-CM | POA: Diagnosis not present

## 2021-07-21 DIAGNOSIS — R519 Headache, unspecified: Secondary | ICD-10-CM | POA: Diagnosis not present

## 2021-07-21 DIAGNOSIS — M5412 Radiculopathy, cervical region: Secondary | ICD-10-CM | POA: Diagnosis not present

## 2021-07-21 DIAGNOSIS — M9902 Segmental and somatic dysfunction of thoracic region: Secondary | ICD-10-CM | POA: Diagnosis not present

## 2021-07-21 DIAGNOSIS — M9901 Segmental and somatic dysfunction of cervical region: Secondary | ICD-10-CM | POA: Diagnosis not present

## 2021-07-23 DIAGNOSIS — M9901 Segmental and somatic dysfunction of cervical region: Secondary | ICD-10-CM | POA: Diagnosis not present

## 2021-07-23 DIAGNOSIS — R519 Headache, unspecified: Secondary | ICD-10-CM | POA: Diagnosis not present

## 2021-07-23 DIAGNOSIS — M9902 Segmental and somatic dysfunction of thoracic region: Secondary | ICD-10-CM | POA: Diagnosis not present

## 2021-07-23 DIAGNOSIS — M5412 Radiculopathy, cervical region: Secondary | ICD-10-CM | POA: Diagnosis not present

## 2021-07-28 DIAGNOSIS — M5412 Radiculopathy, cervical region: Secondary | ICD-10-CM | POA: Diagnosis not present

## 2021-07-28 DIAGNOSIS — M9901 Segmental and somatic dysfunction of cervical region: Secondary | ICD-10-CM | POA: Diagnosis not present

## 2021-07-28 DIAGNOSIS — M9902 Segmental and somatic dysfunction of thoracic region: Secondary | ICD-10-CM | POA: Diagnosis not present

## 2021-07-28 DIAGNOSIS — R519 Headache, unspecified: Secondary | ICD-10-CM | POA: Diagnosis not present

## 2021-07-30 DIAGNOSIS — M5412 Radiculopathy, cervical region: Secondary | ICD-10-CM | POA: Diagnosis not present

## 2021-07-30 DIAGNOSIS — R519 Headache, unspecified: Secondary | ICD-10-CM | POA: Diagnosis not present

## 2021-07-30 DIAGNOSIS — M9901 Segmental and somatic dysfunction of cervical region: Secondary | ICD-10-CM | POA: Diagnosis not present

## 2021-07-30 DIAGNOSIS — M9902 Segmental and somatic dysfunction of thoracic region: Secondary | ICD-10-CM | POA: Diagnosis not present

## 2021-08-04 DIAGNOSIS — M9901 Segmental and somatic dysfunction of cervical region: Secondary | ICD-10-CM | POA: Diagnosis not present

## 2021-08-04 DIAGNOSIS — M9902 Segmental and somatic dysfunction of thoracic region: Secondary | ICD-10-CM | POA: Diagnosis not present

## 2021-08-04 DIAGNOSIS — M5412 Radiculopathy, cervical region: Secondary | ICD-10-CM | POA: Diagnosis not present

## 2021-08-04 DIAGNOSIS — R519 Headache, unspecified: Secondary | ICD-10-CM | POA: Diagnosis not present

## 2021-08-06 DIAGNOSIS — R519 Headache, unspecified: Secondary | ICD-10-CM | POA: Diagnosis not present

## 2021-08-06 DIAGNOSIS — M5412 Radiculopathy, cervical region: Secondary | ICD-10-CM | POA: Diagnosis not present

## 2021-08-06 DIAGNOSIS — M9902 Segmental and somatic dysfunction of thoracic region: Secondary | ICD-10-CM | POA: Diagnosis not present

## 2021-08-06 DIAGNOSIS — M9901 Segmental and somatic dysfunction of cervical region: Secondary | ICD-10-CM | POA: Diagnosis not present

## 2021-08-07 NOTE — Progress Notes (Signed)
[place] Phone: [phone] Subjective:   Stacy West, am serving as a scribe for Dr. Hulan Saas. This visit occurred during the SARS-CoV-2 public health emergency.  Safety protocols were in place, including screening questions prior to the visit, additional usage of staff PPE, and extensive cleaning of exam room while observing appropriate contact time as indicated for disinfecting solutions.   I'm seeing this patient by the request  of:  Pcp, West  CC: [place] pain follow-up hypermobility follow-up  [id number]  06/22/2021 Patient does have significant hypermobility noted.  Patient is seen multiple different providers.  Has had difficulty with in pregnancy for quite some time patient is continuing to attempt to get pregnant and is working with an OB/GYN.  Patient has had a normal echo and continues to follow-up with cardiology.  Patient is going to be seeing ophthalmology in the near future as well.  We did discuss the potential for genetic counseling.  Attempted osteopathic manipulation mostly mild muscle energy and FPR techniques with patient having significant range of motion.  With fusion of the neck only did some very mild stretching.  Patient did respond somewhat.  Patient has some muscle relaxers for breakthrough.  Patient given home exercises to work significantly on hip abductor strengthening.  Follow-up with me again in 4 to 6 weeks  Update 08/10/2021 Stacy West is a 40 y.o. female coming in with complaint of hypermobility. Patient states that she has been going to [employer]. Neck pain is causing constant headaches. Also having SI joint issues since last visit. West change otherwise.  Patient feels that the chiropractor has been more beneficial.  Feels like the osteopathic manipulation was not aggressive enough.  Patient does have a past medical history history significant for C2 fusion Patient states that she is  always in pain.  Does have a Flexeril.  States that over the course of time the thing that helped her the most was Dilaudid.  Patient states that she has had the echocardiogram recently that was normal as well as going to see the ophthalmologist.       Past Medical History:  Diagnosis Date   Abnormal Pap smear of cervix    Allergy    Depression    Ehlers-Danlos disease    Fibromyalgia    Heart murmur    Insulin resistance    Miscarriage    Other cervical disc degeneration at C4-C5 level    PCOS (polycystic ovarian syndrome)    PCOS (polycystic ovarian syndrome)    SI (sacroiliac) joint dysfunction    Sleep apnea    Thyroid disease    Past Surgical History:  Procedure Laterality Date   ADENOIDECTOMY     BACK SURGERY     COLPOSCOPY     FINGER SURGERY Left    NECK SURGERY  07/2020   TONSILLECTOMY     WISDOM TOOTH EXTRACTION     Social History   Socioeconomic History   Marital status: Married    Spouse name: Not on file   Number of children: 1   Years of education: Not on file   Highest education level: Not on file  Occupational History   Not on file  Tobacco Use   Smoking status: Former    Types: Cigarettes    Quit date: 2018    Years since quitting: 4.7   Smokeless tobacco: Never  Vaping Use   Vaping Use: Never used  Substance and Sexual  Activity   Alcohol use: Not Currently   Drug use: Not Currently   Sexual activity: Yes    Birth control/protection: None  Other Topics Concern   Not on file  Social History Narrative   Not on file   Social Determinants of Health   Financial Resource Strain: Not on file  Food Insecurity: Not on file  Transportation Needs: Not on file  Physical Activity: Not on file  Stress: Not on file  Social Connections: Not on file   Allergies  Allergen Reactions   Carisoprodol-Aspirin-Codeine Hives and Itching   Carisoprodol Dermatitis   Percocet  [Oxycodone-Acetaminophen] Dermatitis   Gluten Meal Hives and Nausea Only    Family History  Problem Relation Age of Onset   Heart murmur Mother    Asthma Mother    Skin cancer Father    COPD Father    Heart murmur Maternal Aunt    Breast cancer Maternal Aunt    Bipolar disorder Brother    Depression Brother    Alcoholism Brother    Depression Brother    Early menopause Maternal Aunt          Current Outpatient Medications (Other):    cyclobenzaprine (FLEXERIL) 10 MG tablet,    PRENATAL VIT W/FE-METHYLFOL-FA PO, Take by mouth.   Reviewed prior external information including notes and imaging from  primary care provider As well as notes that were available from care everywhere and other healthcare systems.  Past medical history, social, surgical and family history all reviewed in electronic medical record.  [name] unless stated regarding to the chief complaint.   Review of Systems:  West  visual changes, nausea, vomiting, diarrhea, constipation, dizziness, abdominal pain, skin rash, fevers, chills, night sweats, weight loss, swollen lymph nodes, joint swelling, chest pain, shortness of breath, mood changes. POSITIVE muscle aches, body aches, headache  Objective  Blood pressure 102/84, pulse 80, height 5\' 11"  (1.803 m), weight 187 lb (84.8 kg), SpO2 98 %, unknown if currently breastfeeding.   General: West apparent distress alert and oriented x3 patient did have minorly pressured patient was somewhat standoffish HEENT: Pupils equal, extraocular movements intact  Respiratory: Patient's speak in full sentences and does not appear short of breath  Cardiovascular: West lower extremity edema, non tender, West erythema  Gait normal with good balance and coordination.  MSK: Deferred today.    Impression and Recommendations:     The above documentation has been reviewed and is accurate and complete Lyndal Pulley, DO

## 2021-08-10 ENCOUNTER — Other Ambulatory Visit: Payer: Self-pay

## 2021-08-10 ENCOUNTER — Encounter: Payer: Self-pay | Admitting: Family Medicine

## 2021-08-10 ENCOUNTER — Ambulatory Visit: Payer: Medicare Other | Admitting: Family Medicine

## 2021-08-10 DIAGNOSIS — Q7962 Hypermobile Ehlers-Danlos syndrome: Secondary | ICD-10-CM

## 2021-08-10 NOTE — Assessment & Plan Note (Addendum)
Discussed with patient again at great length.  Reviewed patient's other notes recently as well as laboratory work-up.  Patient feels like the chiropractor is helping more than the osteopathic manipulation.  Feels like is more aggressive than that.  Discussed with her that with patient having the C2 fixation that I do not feel comfortable using significant HVLA on her neck.  We held on any type of treatment today.  Patient has had Flexeril previously.  States that the only thing that helps of her pain is Dilaudid.  Discussed that that would not be prescribed here.  Discussed with patient that we can continue to monitor and sounds like patient is already seeing cardiology as well as seeing ophthalmology.  We discussed potential genetic counseling with patient having a 106-year-old also showing some potential sign she states.  Patient would like to continue with conservative therapy and we can follow-up again in 2 to 3 months.  Could potentially consider a virtual appointment.  Total time reviewing patient's chart as well as discussing with patient greater than 30 minutes

## 2021-08-10 NOTE — Patient Instructions (Signed)
Try COOP pillow Vit C 1-2 g daily Will have to get back to you on Aloe Vera plant Can do video visit in 2-3 months

## 2021-08-11 ENCOUNTER — Encounter: Payer: Self-pay | Admitting: General Surgery

## 2021-08-11 DIAGNOSIS — R519 Headache, unspecified: Secondary | ICD-10-CM | POA: Diagnosis not present

## 2021-08-11 DIAGNOSIS — M9902 Segmental and somatic dysfunction of thoracic region: Secondary | ICD-10-CM | POA: Diagnosis not present

## 2021-08-11 DIAGNOSIS — M9901 Segmental and somatic dysfunction of cervical region: Secondary | ICD-10-CM | POA: Diagnosis not present

## 2021-08-11 DIAGNOSIS — M5412 Radiculopathy, cervical region: Secondary | ICD-10-CM | POA: Diagnosis not present

## 2021-08-17 DIAGNOSIS — Q7962 Hypermobile Ehlers-Danlos syndrome: Secondary | ICD-10-CM | POA: Diagnosis not present

## 2021-08-17 DIAGNOSIS — R000 Tachycardia, unspecified: Secondary | ICD-10-CM | POA: Diagnosis not present

## 2021-08-17 DIAGNOSIS — K219 Gastro-esophageal reflux disease without esophagitis: Secondary | ICD-10-CM | POA: Diagnosis not present

## 2021-08-17 DIAGNOSIS — M5137 Other intervertebral disc degeneration, lumbosacral region: Secondary | ICD-10-CM | POA: Diagnosis not present

## 2021-08-17 DIAGNOSIS — I341 Nonrheumatic mitral (valve) prolapse: Secondary | ICD-10-CM | POA: Diagnosis not present

## 2021-08-18 DIAGNOSIS — M9902 Segmental and somatic dysfunction of thoracic region: Secondary | ICD-10-CM | POA: Diagnosis not present

## 2021-08-18 DIAGNOSIS — R519 Headache, unspecified: Secondary | ICD-10-CM | POA: Diagnosis not present

## 2021-08-18 DIAGNOSIS — M5412 Radiculopathy, cervical region: Secondary | ICD-10-CM | POA: Diagnosis not present

## 2021-08-18 DIAGNOSIS — M9901 Segmental and somatic dysfunction of cervical region: Secondary | ICD-10-CM | POA: Diagnosis not present

## 2021-08-20 DIAGNOSIS — R519 Headache, unspecified: Secondary | ICD-10-CM | POA: Diagnosis not present

## 2021-08-20 DIAGNOSIS — M9901 Segmental and somatic dysfunction of cervical region: Secondary | ICD-10-CM | POA: Diagnosis not present

## 2021-08-20 DIAGNOSIS — M9902 Segmental and somatic dysfunction of thoracic region: Secondary | ICD-10-CM | POA: Diagnosis not present

## 2021-08-20 DIAGNOSIS — M5412 Radiculopathy, cervical region: Secondary | ICD-10-CM | POA: Diagnosis not present

## 2021-08-25 DIAGNOSIS — M5412 Radiculopathy, cervical region: Secondary | ICD-10-CM | POA: Diagnosis not present

## 2021-08-25 DIAGNOSIS — M9902 Segmental and somatic dysfunction of thoracic region: Secondary | ICD-10-CM | POA: Diagnosis not present

## 2021-08-25 DIAGNOSIS — M9901 Segmental and somatic dysfunction of cervical region: Secondary | ICD-10-CM | POA: Diagnosis not present

## 2021-08-25 DIAGNOSIS — R0683 Snoring: Secondary | ICD-10-CM | POA: Diagnosis not present

## 2021-08-25 DIAGNOSIS — R519 Headache, unspecified: Secondary | ICD-10-CM | POA: Diagnosis not present

## 2021-08-27 DIAGNOSIS — M5412 Radiculopathy, cervical region: Secondary | ICD-10-CM | POA: Diagnosis not present

## 2021-08-27 DIAGNOSIS — M9901 Segmental and somatic dysfunction of cervical region: Secondary | ICD-10-CM | POA: Diagnosis not present

## 2021-08-27 DIAGNOSIS — R519 Headache, unspecified: Secondary | ICD-10-CM | POA: Diagnosis not present

## 2021-08-27 DIAGNOSIS — M9902 Segmental and somatic dysfunction of thoracic region: Secondary | ICD-10-CM | POA: Diagnosis not present

## 2021-08-28 DIAGNOSIS — R5383 Other fatigue: Secondary | ICD-10-CM | POA: Diagnosis not present

## 2021-08-28 DIAGNOSIS — M797 Fibromyalgia: Secondary | ICD-10-CM | POA: Diagnosis not present

## 2021-08-30 DIAGNOSIS — R350 Frequency of micturition: Secondary | ICD-10-CM | POA: Diagnosis not present

## 2021-08-30 DIAGNOSIS — R300 Dysuria: Secondary | ICD-10-CM | POA: Diagnosis not present

## 2021-09-01 DIAGNOSIS — M9902 Segmental and somatic dysfunction of thoracic region: Secondary | ICD-10-CM | POA: Diagnosis not present

## 2021-09-01 DIAGNOSIS — M9901 Segmental and somatic dysfunction of cervical region: Secondary | ICD-10-CM | POA: Diagnosis not present

## 2021-09-01 DIAGNOSIS — M5412 Radiculopathy, cervical region: Secondary | ICD-10-CM | POA: Diagnosis not present

## 2021-09-01 DIAGNOSIS — R519 Headache, unspecified: Secondary | ICD-10-CM | POA: Diagnosis not present

## 2021-09-02 DIAGNOSIS — M797 Fibromyalgia: Secondary | ICD-10-CM | POA: Diagnosis not present

## 2021-09-02 DIAGNOSIS — Q7962 Hypermobile Ehlers-Danlos syndrome: Secondary | ICD-10-CM | POA: Diagnosis not present

## 2021-09-02 DIAGNOSIS — M533 Sacrococcygeal disorders, not elsewhere classified: Secondary | ICD-10-CM | POA: Diagnosis not present

## 2021-09-03 DIAGNOSIS — M9902 Segmental and somatic dysfunction of thoracic region: Secondary | ICD-10-CM | POA: Diagnosis not present

## 2021-09-03 DIAGNOSIS — R519 Headache, unspecified: Secondary | ICD-10-CM | POA: Diagnosis not present

## 2021-09-03 DIAGNOSIS — M5412 Radiculopathy, cervical region: Secondary | ICD-10-CM | POA: Diagnosis not present

## 2021-09-03 DIAGNOSIS — M9901 Segmental and somatic dysfunction of cervical region: Secondary | ICD-10-CM | POA: Diagnosis not present

## 2021-09-08 DIAGNOSIS — R519 Headache, unspecified: Secondary | ICD-10-CM | POA: Diagnosis not present

## 2021-09-08 DIAGNOSIS — M9902 Segmental and somatic dysfunction of thoracic region: Secondary | ICD-10-CM | POA: Diagnosis not present

## 2021-09-08 DIAGNOSIS — M5412 Radiculopathy, cervical region: Secondary | ICD-10-CM | POA: Diagnosis not present

## 2021-09-08 DIAGNOSIS — M9901 Segmental and somatic dysfunction of cervical region: Secondary | ICD-10-CM | POA: Diagnosis not present

## 2021-09-10 DIAGNOSIS — Z7185 Encounter for immunization safety counseling: Secondary | ICD-10-CM | POA: Diagnosis not present

## 2021-09-10 DIAGNOSIS — Q796 Ehlers-Danlos syndrome, unspecified: Secondary | ICD-10-CM | POA: Diagnosis not present

## 2021-09-14 DIAGNOSIS — R519 Headache, unspecified: Secondary | ICD-10-CM | POA: Diagnosis not present

## 2021-09-14 DIAGNOSIS — M9902 Segmental and somatic dysfunction of thoracic region: Secondary | ICD-10-CM | POA: Diagnosis not present

## 2021-09-14 DIAGNOSIS — M9901 Segmental and somatic dysfunction of cervical region: Secondary | ICD-10-CM | POA: Diagnosis not present

## 2021-09-14 DIAGNOSIS — M5412 Radiculopathy, cervical region: Secondary | ICD-10-CM | POA: Diagnosis not present

## 2021-09-17 DIAGNOSIS — M9901 Segmental and somatic dysfunction of cervical region: Secondary | ICD-10-CM | POA: Diagnosis not present

## 2021-09-17 DIAGNOSIS — M9902 Segmental and somatic dysfunction of thoracic region: Secondary | ICD-10-CM | POA: Diagnosis not present

## 2021-09-17 DIAGNOSIS — R519 Headache, unspecified: Secondary | ICD-10-CM | POA: Diagnosis not present

## 2021-09-17 DIAGNOSIS — M5412 Radiculopathy, cervical region: Secondary | ICD-10-CM | POA: Diagnosis not present

## 2021-09-22 DIAGNOSIS — R519 Headache, unspecified: Secondary | ICD-10-CM | POA: Diagnosis not present

## 2021-09-22 DIAGNOSIS — M9902 Segmental and somatic dysfunction of thoracic region: Secondary | ICD-10-CM | POA: Diagnosis not present

## 2021-09-22 DIAGNOSIS — M9901 Segmental and somatic dysfunction of cervical region: Secondary | ICD-10-CM | POA: Diagnosis not present

## 2021-09-22 DIAGNOSIS — M5412 Radiculopathy, cervical region: Secondary | ICD-10-CM | POA: Diagnosis not present

## 2021-09-23 DIAGNOSIS — Q796 Ehlers-Danlos syndrome, unspecified: Secondary | ICD-10-CM | POA: Diagnosis not present

## 2021-09-23 DIAGNOSIS — J309 Allergic rhinitis, unspecified: Secondary | ICD-10-CM | POA: Diagnosis not present

## 2021-09-23 DIAGNOSIS — R300 Dysuria: Secondary | ICD-10-CM | POA: Diagnosis not present

## 2021-09-24 ENCOUNTER — Other Ambulatory Visit (HOSPITAL_BASED_OUTPATIENT_CLINIC_OR_DEPARTMENT_OTHER): Payer: Self-pay | Admitting: Internal Medicine

## 2021-09-24 ENCOUNTER — Other Ambulatory Visit: Payer: Self-pay | Admitting: Internal Medicine

## 2021-09-24 DIAGNOSIS — Z86018 Personal history of other benign neoplasm: Secondary | ICD-10-CM

## 2021-09-24 DIAGNOSIS — M9902 Segmental and somatic dysfunction of thoracic region: Secondary | ICD-10-CM | POA: Diagnosis not present

## 2021-09-24 DIAGNOSIS — M9901 Segmental and somatic dysfunction of cervical region: Secondary | ICD-10-CM | POA: Diagnosis not present

## 2021-09-24 DIAGNOSIS — M5412 Radiculopathy, cervical region: Secondary | ICD-10-CM | POA: Diagnosis not present

## 2021-09-24 DIAGNOSIS — R519 Headache, unspecified: Secondary | ICD-10-CM | POA: Diagnosis not present

## 2021-09-29 DIAGNOSIS — R519 Headache, unspecified: Secondary | ICD-10-CM | POA: Diagnosis not present

## 2021-09-29 DIAGNOSIS — M9902 Segmental and somatic dysfunction of thoracic region: Secondary | ICD-10-CM | POA: Diagnosis not present

## 2021-09-29 DIAGNOSIS — M9901 Segmental and somatic dysfunction of cervical region: Secondary | ICD-10-CM | POA: Diagnosis not present

## 2021-09-29 DIAGNOSIS — M5412 Radiculopathy, cervical region: Secondary | ICD-10-CM | POA: Diagnosis not present

## 2021-10-01 DIAGNOSIS — M9901 Segmental and somatic dysfunction of cervical region: Secondary | ICD-10-CM | POA: Diagnosis not present

## 2021-10-01 DIAGNOSIS — R519 Headache, unspecified: Secondary | ICD-10-CM | POA: Diagnosis not present

## 2021-10-01 DIAGNOSIS — M5412 Radiculopathy, cervical region: Secondary | ICD-10-CM | POA: Diagnosis not present

## 2021-10-01 DIAGNOSIS — M9902 Segmental and somatic dysfunction of thoracic region: Secondary | ICD-10-CM | POA: Diagnosis not present

## 2021-10-13 DIAGNOSIS — R519 Headache, unspecified: Secondary | ICD-10-CM | POA: Diagnosis not present

## 2021-10-13 DIAGNOSIS — M9902 Segmental and somatic dysfunction of thoracic region: Secondary | ICD-10-CM | POA: Diagnosis not present

## 2021-10-13 DIAGNOSIS — M5412 Radiculopathy, cervical region: Secondary | ICD-10-CM | POA: Diagnosis not present

## 2021-10-13 DIAGNOSIS — M9901 Segmental and somatic dysfunction of cervical region: Secondary | ICD-10-CM | POA: Diagnosis not present

## 2021-10-13 NOTE — Progress Notes (Signed)
[place] [address] [place] Phone: [phone] Subjective:   IVilma Meckel, am serving as a scribe for Dr. Hulan Saas. This visit occurred during the SARS-CoV-2 public health emergency.  Safety protocols were in place, including screening questions prior to the visit, additional usage of staff PPE, and extensive cleaning of exam room while observing appropriate contact time as indicated for disinfecting solutions.   I'm seeing this patient by the request  of:  Pcp, No  CC: Chronic pain secondary to Erler's Danlos syndrome  [id number]  08/10/2021 Discussed with patient again at great length.  Reviewed patient's other notes recently as well as laboratory work-up.  Patient feels like the chiropractor is helping more than the osteopathic manipulation.  Feels like is more aggressive than that.  Discussed with her that with patient having the C2 fixation that I do not feel comfortable using significant HVLA on her neck.  We held on any type of treatment today.  Patient has had Flexeril previously.  States that the only thing that helps of her pain is Dilaudid.  Discussed that that would not be prescribed here.  Discussed with patient that we can continue to monitor and sounds like patient is already seeing cardiology as well as seeing ophthalmology.  We discussed potential genetic counseling with patient having a 48-year-old also showing some potential sign she states.  Patient would like to continue with conservative therapy and we can follow-up again in 2 to 3 months.  Could potentially consider a virtual appointment.  Total time reviewing patient's chart as well as discussing with patient greater than 30 minutes  Update 10/14/2021 Noura Purpura is a 40 y.o. female coming in with complaint of EDS. Patient states doing okay today. Has been having lots of headaches. Wants muscle relaxers. Trying to get into palliative care.  Patient states that  she continues to have difficulty.  Patient states that he has pain every day.  Sometimes severe enough that gives her difficulty.  Patient states that the only things I do seem to help it is marijuana, home exercises, chiropractic care once to twice a month at least.  Patient also states that Dilaudid when she is having the chronic use.  Patient states that she initially needs to have 10 pills on hand usually when she has a.  Takes them twice a day patient states that primary care is no longer going to give her this medication.  Wanting to know what else she can possibly do.  Patient is adamant that she feels she has a good balance with some of her pain at the moment and would like to find providers who have likeminded treatment.  Goal for patient is to continue to stay active knowing that she will have pain but also the possibility of becoming pregnant again.       Past Medical History:  Diagnosis Date   Abnormal Pap smear of cervix    Allergy    Depression    Ehlers-Danlos disease    Fibromyalgia    Heart murmur    Insulin resistance    Miscarriage    Other cervical disc degeneration at C4-C5 level    PCOS (polycystic ovarian syndrome)    PCOS (polycystic ovarian syndrome)    SI (sacroiliac) joint dysfunction    Sleep apnea    Thyroid disease    Past Surgical History:  Procedure Laterality Date   ADENOIDECTOMY     BACK SURGERY  COLPOSCOPY     FINGER SURGERY Left    NECK SURGERY  07/2020   TONSILLECTOMY     WISDOM TOOTH EXTRACTION     Social History   Socioeconomic History   Marital status: Married    Spouse name: Not on file   Number of children: 1   Years of education: Not on file   Highest education level: Not on file  Occupational History   Not on file  Tobacco Use   Smoking status: Former    Types: Cigarettes    Quit date: 2018    Years since quitting: 4.9   Smokeless tobacco: Never  Vaping Use   Vaping Use: Never used  Substance and Sexual Activity    Alcohol use: Not Currently   Drug use: Not Currently   Sexual activity: Yes    Birth control/protection: None  Other Topics Concern   Not on file  Social History Narrative   Not on file   Social Determinants of Health   Financial Resource Strain: Not on file  Food Insecurity: Not on file  Transportation Needs: Not on file  Physical Activity: Not on file  Stress: Not on file  Social Connections: Not on file   Allergies  Allergen Reactions   Carisoprodol-Aspirin-Codeine Hives and Itching   Carisoprodol Dermatitis   Percocet  [Oxycodone-Acetaminophen] Dermatitis   Gluten Meal Hives and Nausea Only   Family History  Problem Relation Age of Onset   Heart murmur Mother    Asthma Mother    Skin cancer Father    COPD Father    Heart murmur Maternal Aunt    Breast cancer Maternal Aunt    Bipolar disorder Brother    Depression Brother    Alcoholism Brother    Depression Brother    Early menopause Maternal Aunt          Current Outpatient Medications (Other):    cyclobenzaprine (FLEXERIL) 10 MG tablet,    PRENATAL VIT W/FE-METHYLFOL-FA PO, Take by mouth.   Reviewed prior external information including notes and imaging from  primary care provider As well as notes that were available from care everywhere and other healthcare systems.  This includes most recent echocardiogram showing that patient's ejection fraction was normal, appearance of the valves including the aortic and mitral valve were unremarkable with very mild tricuspid insufficiency noted.  Past medical history, social, surgical and family history all reviewed in electronic medical record.  No pertanent information unless stated regarding to the chief complaint.   Review of Systems:  No headache, visual changes, nausea, vomiting, diarrhea, constipation, dizziness, abdominal pain, skin rash, fevers, chills, night sweats, weight loss, swollen lymph nodes, joint swelling, chest pain, shortness of breath, mood  changes. POSITIVE muscle aches, body aches  Objective  Blood pressure 122/78, pulse 95, height 5\' 11"  (1.803 m), weight 193 lb (87.5 kg), SpO2 98 %, unknown if currently breastfeeding.   General: Patient is alert and oriented, does seem to be a little anxious. HEENT: Pupils equal, extraocular movements intact  Respiratory: Patient's speak in full sentences and does not appear short of breath  Cardiovascular: No lower extremity edema, non tender, no erythema  Gait normal with good balance and coordination.  MSK: Hypermobility noted    Impression and Recommendations:    The above documentation has been reviewed and is accurate and complete Lyndal Pulley, DO

## 2021-10-14 ENCOUNTER — Ambulatory Visit (INDEPENDENT_AMBULATORY_CARE_PROVIDER_SITE_OTHER): Payer: Medicare Other | Admitting: Family Medicine

## 2021-10-14 ENCOUNTER — Ambulatory Visit
Admission: RE | Admit: 2021-10-14 | Discharge: 2021-10-14 | Disposition: A | Discharge status: Home / Self Care | Payer: Medicare Other | Source: Ambulatory Visit | Attending: Internal Medicine | Admitting: Internal Medicine

## 2021-10-14 ENCOUNTER — Other Ambulatory Visit: Payer: Self-pay

## 2021-10-14 DIAGNOSIS — D3502 Benign neoplasm of left adrenal gland: Secondary | ICD-10-CM | POA: Diagnosis not present

## 2021-10-14 DIAGNOSIS — R947 Abnormal results of other endocrine function studies: Secondary | ICD-10-CM | POA: Diagnosis not present

## 2021-10-14 DIAGNOSIS — Q7962 Hypermobile Ehlers-Danlos syndrome: Secondary | ICD-10-CM | POA: Diagnosis not present

## 2021-10-14 DIAGNOSIS — Z86018 Personal history of other benign neoplasm: Secondary | ICD-10-CM | POA: Insufficient documentation

## 2021-10-14 NOTE — Assessment & Plan Note (Signed)
Patient continues to have chronic pain.  Does have the postsurgical changes of the neck including the fusion of the C1 and C2.  Patient does feel that the things that have been most beneficial for her is her chiropractic care, intermittent marijuana, her home exercises and the intermittent Dilaudid use.  Discussed with patient that at this moment would I think will be most beneficial thing is that the Dilaudid is helping if there is any other potential pain management that could be helpful.  Patient is adamant that she would be more willing to try palliative care.  States that she has talked to somebody previously and is looking for getting them information.  I would be happy to pass on information about her low stimulus to them if it would be beneficial.  Patient is adamant she would like to have Dilaudid but not beyond on a regular basis.  Patient is still hopeful that she could become potentially pregnant again and would like to have someone understand her treatment including using the above treatments during the pregnancy.  Regarding her low stable seems to be stable at the moment.  Did not feel safe doing HVLA on the cervical spine still at this point.  Discussed with her that I think other providers are probably more beneficial to her at this time but I am here to help her where we can otherwise.  Spent greater than 35 minutes discussing this and different treatment options as well as reviewing patient's office notes including most recent echocardiogram

## 2021-10-14 NOTE — Patient Instructions (Signed)
Have palliative care reach out to [facility] look into pain management that would potential work for you with your balance No significant changes on my part Give [place] some days to figure this out to reach out to you Hold on follow up right now

## 2021-10-15 ENCOUNTER — Telehealth: Payer: Self-pay | Admitting: Nurse Practitioner

## 2021-10-15 NOTE — Telephone Encounter (Signed)
Spoke with patient regarding the Palliative referral/services and all questions were answered and she was in agreement with scheduling visit.  I have scheduled an In-home Consult for 10/28/21 @ 10:30 AM.

## 2021-10-20 DIAGNOSIS — M5412 Radiculopathy, cervical region: Secondary | ICD-10-CM | POA: Diagnosis not present

## 2021-10-20 DIAGNOSIS — M9902 Segmental and somatic dysfunction of thoracic region: Secondary | ICD-10-CM | POA: Diagnosis not present

## 2021-10-20 DIAGNOSIS — M9901 Segmental and somatic dysfunction of cervical region: Secondary | ICD-10-CM | POA: Diagnosis not present

## 2021-10-20 DIAGNOSIS — R519 Headache, unspecified: Secondary | ICD-10-CM | POA: Diagnosis not present

## 2021-10-27 DIAGNOSIS — M5412 Radiculopathy, cervical region: Secondary | ICD-10-CM | POA: Diagnosis not present

## 2021-10-27 DIAGNOSIS — M9902 Segmental and somatic dysfunction of thoracic region: Secondary | ICD-10-CM | POA: Diagnosis not present

## 2021-10-27 DIAGNOSIS — R519 Headache, unspecified: Secondary | ICD-10-CM | POA: Diagnosis not present

## 2021-10-27 DIAGNOSIS — M9901 Segmental and somatic dysfunction of cervical region: Secondary | ICD-10-CM | POA: Diagnosis not present

## 2021-10-28 ENCOUNTER — Other Ambulatory Visit: Payer: Medicare Other | Admitting: Nurse Practitioner

## 2021-10-28 ENCOUNTER — Other Ambulatory Visit: Payer: Self-pay

## 2021-10-28 ENCOUNTER — Telehealth: Payer: Self-pay | Admitting: Nurse Practitioner

## 2021-10-28 NOTE — Telephone Encounter (Signed)
I called Stacy West to confirm initial pc visit and covid screening, no answer, message left with contact information to return call to confirm visit scheduled.

## 2021-10-30 ENCOUNTER — Telehealth: Payer: Self-pay | Admitting: Nurse Practitioner

## 2021-10-30 DIAGNOSIS — M9902 Segmental and somatic dysfunction of thoracic region: Secondary | ICD-10-CM | POA: Diagnosis not present

## 2021-10-30 DIAGNOSIS — M5412 Radiculopathy, cervical region: Secondary | ICD-10-CM | POA: Diagnosis not present

## 2021-10-30 DIAGNOSIS — M9901 Segmental and somatic dysfunction of cervical region: Secondary | ICD-10-CM | POA: Diagnosis not present

## 2021-10-30 DIAGNOSIS — R519 Headache, unspecified: Secondary | ICD-10-CM | POA: Diagnosis not present

## 2021-10-30 NOTE — Telephone Encounter (Signed)
Spoke with patient and have rescheduled the Palliative Consult for 11/05/21 @ 10 AM.

## 2021-11-03 DIAGNOSIS — M5412 Radiculopathy, cervical region: Secondary | ICD-10-CM | POA: Diagnosis not present

## 2021-11-03 DIAGNOSIS — R519 Headache, unspecified: Secondary | ICD-10-CM | POA: Diagnosis not present

## 2021-11-03 DIAGNOSIS — M9902 Segmental and somatic dysfunction of thoracic region: Secondary | ICD-10-CM | POA: Diagnosis not present

## 2021-11-03 DIAGNOSIS — M9901 Segmental and somatic dysfunction of cervical region: Secondary | ICD-10-CM | POA: Diagnosis not present

## 2021-11-05 ENCOUNTER — Other Ambulatory Visit: Payer: Medicare Other | Admitting: Nurse Practitioner

## 2021-11-05 ENCOUNTER — Encounter: Payer: Self-pay | Admitting: Nurse Practitioner

## 2021-11-05 ENCOUNTER — Other Ambulatory Visit: Payer: Self-pay

## 2021-11-05 DIAGNOSIS — R519 Headache, unspecified: Secondary | ICD-10-CM | POA: Diagnosis not present

## 2021-11-05 DIAGNOSIS — M9902 Segmental and somatic dysfunction of thoracic region: Secondary | ICD-10-CM | POA: Diagnosis not present

## 2021-11-05 DIAGNOSIS — M797 Fibromyalgia: Secondary | ICD-10-CM | POA: Diagnosis not present

## 2021-11-05 DIAGNOSIS — R5381 Other malaise: Secondary | ICD-10-CM | POA: Diagnosis not present

## 2021-11-05 DIAGNOSIS — M9901 Segmental and somatic dysfunction of cervical region: Secondary | ICD-10-CM | POA: Diagnosis not present

## 2021-11-05 DIAGNOSIS — M5412 Radiculopathy, cervical region: Secondary | ICD-10-CM | POA: Diagnosis not present

## 2021-11-05 DIAGNOSIS — G8929 Other chronic pain: Secondary | ICD-10-CM

## 2021-11-05 DIAGNOSIS — Z515 Encounter for palliative care: Secondary | ICD-10-CM

## 2021-11-05 NOTE — Progress Notes (Signed)
[facility] Consult Note Telephone: [phone]  Fax: [phone]   Date of encounter: 11/05/21 4:46 PM PATIENT NAME: Stacy West [place] [id number]   [phone] (home)  DOB: 23-Jul-1981 MRN: [id number] PRIMARY CARE PROVIDER:    Macarthur Critchley, MD,  [address] [place] [address] [phone]  REFERRING PROVIDER:   Dr Stacy West  RESPONSIBLE PARTY:    Contact Information     Name Relation Home Work Mobile   [place]   [phone]      I met face to face with patient in home. Palliative Care was asked to follow this patient by consultation request of  Ziglar, Lincoln Brigham, MD to address advance care planning and complex medical decision making. This is the initial visit.               ASSESSMENT AND PLAN / RECOMMENDATIONS:  Symptom Management/Plan: 1. Advance Care Planning; Will discuss at next [facility] visit  2. Goals of Care: Goals include to maximize quality of life and symptom management. Our advance care planning conversation included a discussion about:    The value and importance of advance care planning  Exploration of personal, cultural or spiritual beliefs that might influence medical decisions  Exploration of goals of care in the event of a sudden injury or illness  Identification and preparation of a healthcare agent  Review and updating or creation of an advance directive document.  3. Palliative care encounter; Palliative care encounter; Palliative medicine team will continue to support patient, patient's family, and medical team. Visit consisted of counseling and education dealing with the complex and emotionally intense issues of symptom management and palliative care in the setting of serious and potentially life-threatening illness  4. Chronic pain secondary to fibromyalgia; discussed pain control, regimen. Discussed at this time would not be able to prescribe opiates as [name]  currently uses Marijuana on a daily basis. [name] endorses that does help relieve her pain. [name] endorses prior to moving to [facility] from [place] she was prescribed in the past dilaudid with some relief. Discussed alternative pain modalities. Encouraged [name] to continue cognitive therapy and counseling. Will do referral to [facility] for further discussion of counseling, options for resources.   5. Debility secondary to fibromyalgia; discussed current routine, encourage [name] mobility; [name] endorses she has been trying to get pregnant also.   6. f/u 1 month for ongoing monitoring chronic disease progression, ongoing discussions complex medical decision making by [facility] RN; then 2 months with [facility] NP  Follow up Palliative Care Visit: Palliative care will continue to follow for complex medical decision making, advance care planning, and clarification of goals. Return 4 weeks or prn.  I spent 78 minutes providing this consultation. More than 50% of the time in this consultation was spent in counseling and care coordination. PPS: 60%  Chief Complaint: Initial palliative consult for complex medical decision making  HISTORY OF PRESENT ILLNESS:  Stacy West is a 40 y.o. year old female  with multiple medical problems including Ehlers-Danlos disease, fibromyalgia, heart murmur, insulin resistance, multiple miscarriages, polycystic ovarian syndrome, sleep apnea, SI joint dysfunction, cervical disc degeneration at C4-C5, adenoidectomy, h/o back sgy, neck sgy, tonsillectomy, wisdon tooth extraction, colposcopy, left finger sgy. [name] confirmed in person PC initial visit. [name] was present in her home with young daughter. [name] was ambulatory. We sat on her couch in the living room, Ms.  Stacy West reviewed life review, family history, dynamics, family dysfunction. [name] talked at length about cognitive therapy with counseling she is participating in. We talked about past medical  history, chronic disease of Ehlers-Danlos disease, fibromyalgia. [name] talked at length about her experiences with medical professionals feeling like she is not heard, providers not familiar with Ehlers-Danlos, her needs are not being met especially surrounding pain. [name] relocated with her husband to [place] [facility] from [place]. [name] endorses she was dx with Ehlers-Danlos when she was pregnant but had symptoms since she was a young child. [name] talked about her pregnancies, miscarriages and verbalizes she is currently in the process of trying to get pregnant. [name] endorses she has been having difficulty finding a OB that would do early interventions with high risk. We talked about ros, symptoms including pain at length. [name] endorses she has been having a very hard time to find a provider to prescribe her opiates when she does smoke marijuana daily which gives her relief. We talked about risks, challenges that is legal in [place] where as  is illegal. We talked about challenges with social interactions, behaviors, quality of life. Medical goals reviewed. We talked about role pc in poc. Discussed options of a provider specializing in Ehlers-Danlos which [name] was in agreement, contact information for [name] to research and see if she feels would be helpful. We talked about fibromyalgia and lyrica. [name] endorses she does not want to take lyrica as it would interfer if she did get pregnant. We talked about PC SW and PC RN. Stacy West in agreement to having PC SW and PC RN to follow up. Therapeutic listening, emotional support provided. Questions answered. PC RN to call to schedule f/u visit. Discussed role pc in poc.  History obtained from review of EMR, discussion with  [name].  I reviewed available labs, medications, imaging, studies and related documents from the EMR.  Records reviewed and summarized above.   ROS Reviewed 10 systems, all negative except what is in  [facility]  Physical Exam: Constitutional: NAD General: pleasant female EYES: lids intact ENMT: oral mucous membranes moist CV: S1S2, RRR, no LE edema Pulmonary: LCTA, no increased work of breathing, no cough, room air MSK: ambulatory Skin: warm and dry Neuro:  no generalized weakness,  no cognitive impairment Psych: non-anxious affect, A and O x 3 CURRENT PROBLEM LIST:  Patient Active Problem List   Diagnosis Date Noted   Somatic dysfunction of spine, sacral 06/22/2021   Right ovarian cyst 08/15/2019   Encounter for preconception consultation 06/08/2019   MVP (mitral valve prolapse) 05/31/2019   Ehlers-Danlos syndrome type III 05/17/2019   PCOS (polycystic ovarian syndrome) 05/17/2019   PAST MEDICAL HISTORY:  Active Ambulatory Problems    Diagnosis Date Noted   Ehlers-Danlos syndrome type III 05/17/2019   Encounter for preconception consultation 06/08/2019   MVP (mitral valve prolapse) 05/31/2019   PCOS (polycystic ovarian syndrome) 05/17/2019   Right ovarian cyst 08/15/2019   Somatic dysfunction of spine, sacral 06/22/2021   Resolved Ambulatory Problems    Diagnosis Date Noted   No Resolved Ambulatory Problems   Past Medical History:  Diagnosis Date   Abnormal Pap smear of cervix    Allergy    Depression    Ehlers-Danlos disease    Fibromyalgia    Heart murmur    Insulin resistance    Miscarriage    Other cervical disc degeneration at C4-C5 level    SI (sacroiliac) joint  dysfunction    Sleep apnea    Thyroid disease    SOCIAL HX:  Social History   Tobacco Use   Smoking status: Former    Types: Cigarettes    Quit date: 2018    Years since quitting: 4.9   Smokeless tobacco: Never  Substance Use Topics   Alcohol use: Not Currently   FAMILY HX:  Family History  Problem Relation Age of Onset   Heart murmur Mother    Asthma Mother    Skin cancer Father    COPD Father    Heart murmur Maternal Aunt    Breast cancer Maternal Aunt    Bipolar disorder  Brother    Depression Brother    Alcoholism Brother    Depression Brother    Early menopause Maternal Aunt     reviewed  ALLERGIES:  Allergies  Allergen Reactions   Carisoprodol-Aspirin-Codeine Hives and Itching   Carisoprodol Dermatitis   Percocet  [Oxycodone-Acetaminophen] Dermatitis   Gluten Meal Hives and Nausea Only     PERTINENT MEDICATIONS:  Outpatient Encounter Medications as of 11/05/2021  Medication Sig   cyclobenzaprine (FLEXERIL) 10 MG tablet    PRENATAL VIT W/FE-METHYLFOL-FA PO Take by mouth.   No facility-administered encounter medications on file as of 11/05/2021.   Thank you for the opportunity to participate in the care of Ms. Daddona.  The palliative care team will continue to follow. Please call our office at [phone] if we can be of additional assistance.   This chart was dictated using voice recognition software.  Despite best efforts to proofread,  errors can occur which can change the documentation meaning.   Questions and concerns were addressed. The patient/family was encouraged to call with questions and/or concerns. My business card was provided. Provided general support and encouragement, no other unmet needs identified   Jerrine Urschel Z Lazlo Tunney, NP ,   COVID-19 PATIENT SCREENING TOOL Asked and negative response unless otherwise noted:  Have you had symptoms of covid, tested positive or been in contact with someone with symptoms/positive test in the past 5-10 days? NO

## 2021-11-10 DIAGNOSIS — R519 Headache, unspecified: Secondary | ICD-10-CM | POA: Diagnosis not present

## 2021-11-10 DIAGNOSIS — M9901 Segmental and somatic dysfunction of cervical region: Secondary | ICD-10-CM | POA: Diagnosis not present

## 2021-11-10 DIAGNOSIS — M5412 Radiculopathy, cervical region: Secondary | ICD-10-CM | POA: Diagnosis not present

## 2021-11-10 DIAGNOSIS — M9902 Segmental and somatic dysfunction of thoracic region: Secondary | ICD-10-CM | POA: Diagnosis not present

## 2021-11-15 ENCOUNTER — Other Ambulatory Visit: Payer: Self-pay | Admitting: Obstetrics and Gynecology

## 2021-11-17 ENCOUNTER — Telehealth: Payer: Self-pay

## 2021-11-17 NOTE — Telephone Encounter (Signed)
PC SW outreached patient, per [facility] NP - C. Gusler, request, to follow up on previous visit needs.   Call unsuccessful. SW left VM. Awaiting return call.

## 2021-11-18 ENCOUNTER — Telehealth: Payer: Self-pay

## 2021-11-18 NOTE — Telephone Encounter (Signed)
PC SW outreached patient, returning her TC.    Call unsuccessful. SW left VM. Awaiting return call.

## 2021-11-19 DIAGNOSIS — R519 Headache, unspecified: Secondary | ICD-10-CM | POA: Diagnosis not present

## 2021-11-19 DIAGNOSIS — M5412 Radiculopathy, cervical region: Secondary | ICD-10-CM | POA: Diagnosis not present

## 2021-11-19 DIAGNOSIS — M9901 Segmental and somatic dysfunction of cervical region: Secondary | ICD-10-CM | POA: Diagnosis not present

## 2021-11-19 DIAGNOSIS — M9902 Segmental and somatic dysfunction of thoracic region: Secondary | ICD-10-CM | POA: Diagnosis not present

## 2021-11-23 ENCOUNTER — Telehealth: Payer: Self-pay

## 2021-11-23 NOTE — Telephone Encounter (Signed)
PC SW spoke with patient to re-scheduled in home PC visit from 11/30/21 to 12/04/21 @3 :45 pm.

## 2021-11-24 DIAGNOSIS — M9901 Segmental and somatic dysfunction of cervical region: Secondary | ICD-10-CM | POA: Diagnosis not present

## 2021-11-24 DIAGNOSIS — M5412 Radiculopathy, cervical region: Secondary | ICD-10-CM | POA: Diagnosis not present

## 2021-11-24 DIAGNOSIS — R519 Headache, unspecified: Secondary | ICD-10-CM | POA: Diagnosis not present

## 2021-11-24 DIAGNOSIS — M9902 Segmental and somatic dysfunction of thoracic region: Secondary | ICD-10-CM | POA: Diagnosis not present

## 2021-12-01 DIAGNOSIS — M5412 Radiculopathy, cervical region: Secondary | ICD-10-CM | POA: Diagnosis not present

## 2021-12-01 DIAGNOSIS — M9902 Segmental and somatic dysfunction of thoracic region: Secondary | ICD-10-CM | POA: Diagnosis not present

## 2021-12-01 DIAGNOSIS — M9901 Segmental and somatic dysfunction of cervical region: Secondary | ICD-10-CM | POA: Diagnosis not present

## 2021-12-01 DIAGNOSIS — R519 Headache, unspecified: Secondary | ICD-10-CM | POA: Diagnosis not present

## 2021-12-04 ENCOUNTER — Other Ambulatory Visit: Payer: Self-pay

## 2021-12-04 ENCOUNTER — Other Ambulatory Visit: Payer: Medicare Other

## 2021-12-04 DIAGNOSIS — Z515 Encounter for palliative care: Secondary | ICD-10-CM

## 2021-12-04 NOTE — Progress Notes (Signed)
COMMUNITY PALLIATIVE CARE SW NOTE  PATIENT NAME: Stacy West DOB: 1981/10/23 MRN: [id number]  PRIMARY CARE PROVIDER: Macarthur Critchley, MD  RESPONSIBLE PARTY:  Acct ID - Guarantor Home Phone Work Phone Relationship Acct Type  [id number] Stacy West, Stacy West* [phone]  Self P/F     [place], [place],  [address]     PLAN OF CARE and INTERVENTIONS:              GOALS OF CARE/ ADVANCE CARE PLANNING:  Goals include to maximize quality of life and symptom management. Our advance care planning conversation included a discussion about:    The value and importance of advance care planning  Review and updating or creation of an advance directive document.    Patient is a full code.  2. Palliative care encounter: Palliative medicine team will continue to support patient, patient's family, and medical team. Visit consisted of counseling and education dealing with the complex and emotionally intense issues of symptom management and palliative care in the setting of serious and potentially life-threatening illness.  SW and RN met with patient for in home PC follow up visit.  Functional changes/updates: Patient is a 41 year old female with Ehlers-Danlos Syndrome. Patient is in of a specialty provider for this syndrome. Patient share that this syndrome has caused great debility for her. Patient share that she uses herbal supplements to address her pain. She is unable to do things she used to. Patient shared that she has pain and is usually tired and fatigued. Patient share that she is currently in the process of trying to conceive her 2nd child, but is facing barriers of conception due to her medical complexities.   Home & Environment assessment: Patient feels safe in her home environment. Patients home is two level. Patient is able to maneuver around his home without issue.   Protein calorie malnutrition/weight loss/Appetite: Patient share that his appetite is good.  Psychosocial  assessment: completed. No physical needs identified. Ongoing support/resources will continued to be offered if needed. Patient still drives.  Military hx: N/A.  Medical assessment: RN reviewed medications and took vitals. RN and SW provided education around potential life threatening illness and symptom management. Patient is managing her medical conditions well and is compliant with all providers. Patient has a new PCP appt with [facility] March 2023. Patient share she would like to be connected to a neurologist. Patient could benefit from being connected with a Ehlers-Danlos specialist as well. Patient shared her frustration in encountering providers that are not awre of this syndrome and not able to treat accordingly. No medication needs at this time.  Hospice discussion: N/A  SW discussed goals, reviewed care plan, provided emotional support, used active and reflective listening in the form of reciprocity emotional response. Questions and concerns were addressed. The patient was encouraged to call with any additional questions and/or concerns. PC Provided general support and encouragement, no other unmet needs identified. Will continue to follow.  3.         PATIENT/CAREGIVER EDUCATION/ COPING:   Appearance: well groomed, appropriate given situation  Mental Status: Alert/oriented x3. Eye Contact: Good. Able to engage in proper eye contact  Thought Process: rational  Thought Content: not assessed  Speech: Normal rate, volume, tone  Mood: Normal and calm Affect: Congruent to endorsed mood, full ranging Insight: normal Judgement: normal  Interaction Style: Cooperative  Patient A&O x3, patient engaged in fluent conversation and answered all questions appropriately. Patient is able to make his needs known. Patient  express her stress level feelings of being over whelmed with managing her medical needs as well as other life stressors to include financial concerns and family strains. Patient shared  her family hx and estrangement due to crossing boundaries. Patient also share she and her husband's family mental health hx. Bipolar, manic depression and substance abuse hx shared as being present in family. Patient currently is engaged in [place] and EMDR therapeutic intervention counseling with [facility] counseling services. Patient meets with her counselor every Fri. Patient could benefit from seeing her counselor more than once a week. Patient share that she feels this is going well, no concerns expressed. Patient is not interested in psychotropic medication interventions. PHQ 9 not assessed. Patients children and other family locally and are all supportive.  4.         PERSONAL EMERGENCY PLAN:  Patient will call 9-1-1 for emergencies.   5.         COMMUNITY RESOURCES COORDINATION/ HEALTH CARE NAVIGATION:  Patient manages his care.  6.  FINANCIAL CONCERNS/NEEDS: None.   Primary Health Insurance:  Medicare Secondary Health Insurance: N/A Prescription Coverage: Yes, no history of difficulty obtaining or affording prescriptions reported     SOCIAL HX:  Social History   Tobacco Use   Smoking status: Former    Types: Cigarettes    Quit date: 2018    Years since quitting: 5.0   Smokeless tobacco: Never  Substance Use Topics   Alcohol use: Not Currently    CODE STATUS: Full code ADVANCED DIRECTIVES: N MOST FORM COMPLETE:  N HOSPICE EDUCATION PROVIDED: N  [id number] is INDP with all ADL's at this time. Patient is A&O x3 with average insight and judgement  Time spent: 1 hr      [place], [place]

## 2021-12-04 NOTE — Progress Notes (Signed)
PATIENT NAME: Stacy West DOB: 1981-04-26 MRN: [id number]  PRIMARY CARE PROVIDER: Macarthur Critchley, MD  RESPONSIBLE PARTY:  Acct ID - Guarantor Home Phone Work Phone Relationship Acct Type  [id number] KAYDINCE, TOWLES* [phone]  Self P/F     [place], [place], [place] [address]    PLAN OF CARE and INTERVENTIONS:               1.  GOALS OF CARE/ ADVANCE CARE PLANNING:  Patient desires to live her life as independently as possible.  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               2.  PATIENT/CAREGIVER EDUCATION:  Follow-up with new PCP as scheduled and consider searching for a specialty provider in [place].               4. PERSONAL EMERGENCY PLAN:  Activate 911 for emergencies.               5.  DISEASE STATUS:  Joint visit completed with [place], SW. Active listening provided as patient discussed debility related to fibromyalgia and ehlers danlos syndrome.  She shared completed family dynamics with her family and also spouse and in-laws.  Patient desires to conceive a 2nd child and shared her history of miscarriages.  She voiced her frustration with not having a provider who understands her complicated medical situation.   We discussed following up with her new PCP either next month or March.  Discussed looking for a specialty provider familiar with Drue Dun Syndrome.  SW followed up on mental health concerns and patient confirms she is already in counseling and receiving therapy sessions to help with past trauma.   Patient endorses pain as a constant issues.  She does not wish to be on opiates and currently has flexeril in place.  Patient endorses use of herbal supplements to help with pain and to aide in sleep.  She did not elaborate on specific herbal usage.  Patient will follow up with new PCP regarding treatment options for her pain.    HISTORY OF PRESENT ILLNESS:  Stacy West is  a 41 y.o. year old female  with multiple medical problems including Ehlers-Danlos disease, fibromyalgia, heart murmur, insulin resistance, multiple miscarriages, polycystic ovarian syndrome, sleep apnea, SI joint dysfunction, cervical disc degeneration at C4-C5, adenoidectomy, h/o back sgy, neck sgy, tonsillectomy, wisdon tooth extraction, colposcopy, left finger surgery.  CODE STATUS: Full ADVANCED DIRECTIVES: No MOST FORM: No PPS: 60%         Stacy Burton, RN

## 2021-12-06 ENCOUNTER — Encounter: Payer: Self-pay | Admitting: Obstetrics and Gynecology

## 2021-12-07 MED ORDER — METFORMIN HCL 500 MG PO TABS: ORAL_TABLET | ORAL | 5 refills | Status: DC

## 2021-12-07 MED ORDER — MEDROXYPROGESTERONE ACETATE 10 MG PO TABS: 20.0000 mg | ORAL_TABLET | Freq: Every day | ORAL | 4 refills | Status: DC

## 2021-12-08 DIAGNOSIS — M9902 Segmental and somatic dysfunction of thoracic region: Secondary | ICD-10-CM | POA: Diagnosis not present

## 2021-12-08 DIAGNOSIS — M5412 Radiculopathy, cervical region: Secondary | ICD-10-CM | POA: Diagnosis not present

## 2021-12-08 DIAGNOSIS — M9901 Segmental and somatic dysfunction of cervical region: Secondary | ICD-10-CM | POA: Diagnosis not present

## 2021-12-08 DIAGNOSIS — R519 Headache, unspecified: Secondary | ICD-10-CM | POA: Diagnosis not present

## 2021-12-08 MED ORDER — GLIPIZIDE 5 MG PO TABS: 5.0000 mg | ORAL_TABLET | Freq: Every day | ORAL | 3 refills | Status: DC

## 2021-12-08 NOTE — Addendum Note (Signed)
Addended by: Augusto Gamble on: 12/08/2021 06:18 PM   Modules accepted: Orders

## 2021-12-09 DIAGNOSIS — M9902 Segmental and somatic dysfunction of thoracic region: Secondary | ICD-10-CM | POA: Diagnosis not present

## 2021-12-09 DIAGNOSIS — M5412 Radiculopathy, cervical region: Secondary | ICD-10-CM | POA: Diagnosis not present

## 2021-12-09 DIAGNOSIS — M9901 Segmental and somatic dysfunction of cervical region: Secondary | ICD-10-CM | POA: Diagnosis not present

## 2021-12-09 DIAGNOSIS — R519 Headache, unspecified: Secondary | ICD-10-CM | POA: Diagnosis not present

## 2021-12-15 DIAGNOSIS — R519 Headache, unspecified: Secondary | ICD-10-CM | POA: Diagnosis not present

## 2021-12-15 DIAGNOSIS — M5412 Radiculopathy, cervical region: Secondary | ICD-10-CM | POA: Diagnosis not present

## 2021-12-15 DIAGNOSIS — M9901 Segmental and somatic dysfunction of cervical region: Secondary | ICD-10-CM | POA: Diagnosis not present

## 2021-12-15 DIAGNOSIS — M9902 Segmental and somatic dysfunction of thoracic region: Secondary | ICD-10-CM | POA: Diagnosis not present

## 2021-12-16 ENCOUNTER — Other Ambulatory Visit: Payer: Self-pay | Admitting: Obstetrics and Gynecology

## 2021-12-18 DIAGNOSIS — M9901 Segmental and somatic dysfunction of cervical region: Secondary | ICD-10-CM | POA: Diagnosis not present

## 2021-12-18 DIAGNOSIS — M5412 Radiculopathy, cervical region: Secondary | ICD-10-CM | POA: Diagnosis not present

## 2021-12-18 DIAGNOSIS — M9902 Segmental and somatic dysfunction of thoracic region: Secondary | ICD-10-CM | POA: Diagnosis not present

## 2021-12-18 DIAGNOSIS — R519 Headache, unspecified: Secondary | ICD-10-CM | POA: Diagnosis not present

## 2021-12-20 DIAGNOSIS — M542 Cervicalgia: Secondary | ICD-10-CM | POA: Insufficient documentation

## 2021-12-20 DIAGNOSIS — G894 Chronic pain syndrome: Secondary | ICD-10-CM | POA: Insufficient documentation

## 2021-12-20 DIAGNOSIS — G8929 Other chronic pain: Secondary | ICD-10-CM | POA: Insufficient documentation

## 2021-12-20 DIAGNOSIS — M797 Fibromyalgia: Secondary | ICD-10-CM | POA: Insufficient documentation

## 2021-12-22 DIAGNOSIS — R519 Headache, unspecified: Secondary | ICD-10-CM | POA: Diagnosis not present

## 2021-12-22 DIAGNOSIS — M5412 Radiculopathy, cervical region: Secondary | ICD-10-CM | POA: Diagnosis not present

## 2021-12-22 DIAGNOSIS — M9902 Segmental and somatic dysfunction of thoracic region: Secondary | ICD-10-CM | POA: Diagnosis not present

## 2021-12-22 DIAGNOSIS — M9901 Segmental and somatic dysfunction of cervical region: Secondary | ICD-10-CM | POA: Diagnosis not present

## 2021-12-25 ENCOUNTER — Ambulatory Visit (INDEPENDENT_AMBULATORY_CARE_PROVIDER_SITE_OTHER): Payer: Medicare Other | Admitting: Nurse Practitioner

## 2021-12-25 ENCOUNTER — Encounter: Payer: Self-pay | Admitting: Nurse Practitioner

## 2021-12-25 ENCOUNTER — Other Ambulatory Visit: Payer: Self-pay

## 2021-12-25 VITALS — BP 88/58 | HR 90 | Temp 98.2°F | Ht 69.0 in | Wt 203.4 lb

## 2021-12-25 DIAGNOSIS — G894 Chronic pain syndrome: Secondary | ICD-10-CM

## 2021-12-25 DIAGNOSIS — R000 Tachycardia, unspecified: Secondary | ICD-10-CM | POA: Diagnosis not present

## 2021-12-25 DIAGNOSIS — M797 Fibromyalgia: Secondary | ICD-10-CM

## 2021-12-25 DIAGNOSIS — K219 Gastro-esophageal reflux disease without esophagitis: Secondary | ICD-10-CM

## 2021-12-25 DIAGNOSIS — F331 Major depressive disorder, recurrent, moderate: Secondary | ICD-10-CM | POA: Diagnosis not present

## 2021-12-25 DIAGNOSIS — Z7689 Persons encountering health services in other specified circumstances: Secondary | ICD-10-CM | POA: Diagnosis not present

## 2021-12-25 DIAGNOSIS — Q7962 Hypermobile Ehlers-Danlos syndrome: Secondary | ICD-10-CM

## 2021-12-25 DIAGNOSIS — E282 Polycystic ovarian syndrome: Secondary | ICD-10-CM

## 2021-12-25 DIAGNOSIS — G8929 Other chronic pain: Secondary | ICD-10-CM

## 2021-12-25 NOTE — Assessment & Plan Note (Signed)
Chronic, ongoing.  Continue PPI daily and consider reduction in future.  Labs today.

## 2021-12-25 NOTE — Assessment & Plan Note (Signed)
Chronic, ongoing.  At this time continue Flexeril as needed.  She does use MJ, which offers her benefit -- used this in [place].  Discussed with her regulations in [place], which she is aware of.

## 2021-12-25 NOTE — Assessment & Plan Note (Signed)
Chronic, stable at this time without medication.  Denies SI/HI.  Is seeing therapy, continue this relationship and initiate medication as needed.  Could consider SSRI.
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
## 2021-12-25 NOTE — Assessment & Plan Note (Signed)
Chronic, ongoing -- has had difficulty finding specialist in [place] who understands the diagnosis and treatment needs.  Will refer to get her into Dr. Sunday Shams at [facility] for rheumatology and Dr. Pernell Dupre at [facility] for cardiology -- both appear to have experience with connective tissue disorders.  May also consider trying to get her into Dr. Cristy Friedlander in [facility], provided information to patient to reach out to him.  Would benefit multidisciplinary approach.  Labs today. Continue to work with palliative team.

## 2021-12-25 NOTE — Assessment & Plan Note (Signed)
Followed by GYN, continue this collaboration and Glipizide as ordered by them.

## 2021-12-25 NOTE — Patient Instructions (Signed)
Chronic Back Pain When back pain lasts longer than 3 months, it is called chronic back pain. Pain may get worse at certain times (flare-ups). There are things you can do at home to manage your pain. Follow these instructions at home: Pay attention to any changes in your symptoms. Take these actions to help with your pain: Managing pain and stiffness   If told, put ice on the painful area. Your doctor may tell you to use ice for 24-48 hours after the flare-up starts. To do this: Put ice in a plastic bag. Place a towel between your skin and the bag. Leave the ice on for 20 minutes, 2-3 times a day. If told, put heat on the painful area. Do this as often as told by your doctor. Use the heat source that your doctor recommends, such as a moist heat pack or a heating pad. Place a towel between your skin and the heat source. Leave the heat on for 20-30 minutes. Take off the heat if your skin turns bright red. This is especially important if you are unable to feel pain, heat, or cold. You may have a greater risk of getting burned. Soak in a warm bath. This can help relieve pain. Activity  Avoid bending and other activities that make pain worse. When standing: Keep your upper back and neck straight. Keep your shoulders pulled back. Avoid slouching. When sitting: Keep your back straight. Relax your shoulders. Do not round your shoulders or pull them backward. Do not sit or stand in one place for long periods of time. Take short rest breaks during the day. Lying down or standing is usually better than sitting. Resting can help relieve pain. When sitting or lying down for a long time, do some mild activity or stretching. This will help to prevent stiffness and pain. Get regular exercise. Ask your doctor what activities are safe for you. Do not lift anything that is heavier than 10 lb (4.5 kg) or the limit that you are told, until your doctor says that it is safe. To prevent injury when you lift  things: Bend your knees. Keep the weight close to your body. Avoid twisting. Sleep on a firm mattress. Try lying on your side with your knees slightly bent. If you lie on your back, put a pillow under your knees. Medicines Treatment may include medicines for pain and swelling taken by mouth or put on the skin, prescription pain medicine, or muscle relaxants. Take over-the-counter and prescription medicines only as told by your doctor. Ask your doctor if the medicine prescribed to you: Requires you to avoid driving or using machinery. Can cause trouble pooping (constipation). You may need to take these actions to prevent or treat trouble pooping: Drink enough fluid to keep your pee (urine) pale yellow. Take over-the-counter or prescription medicines. Eat foods that are high in fiber. These include beans, whole grains, and fresh fruits and vegetables. Limit foods that are high in fat and sugars. These include fried or sweet foods. General instructions Do not use any products that contain nicotine or tobacco, such as cigarettes, e-cigarettes, and chewing tobacco. If you need help quitting, ask your doctor. Keep all follow-up visits as told by your doctor. This is important. Contact a doctor if: Your pain does not get better with rest or medicine. Your pain gets worse, or you have new pain. You have a high fever. You lose weight very quickly. You have trouble doing your normal activities. Get help right away if: One   or both of your legs or feet feel weak. One or both of your legs or feet lose feeling (have numbness). You have trouble controlling when you poop (have a bowel movement) or pee (urinate). You have bad back pain and: You feel like you may vomit (nauseous), or you vomit. You have pain in your belly (abdomen). You have shortness of breath. You faint. Summary When back pain lasts longer than 3 months, it is called chronic back pain. Pain may get worse at certain times  (flare-ups). Use ice and heat as told by your doctor. Your doctor may tell you to use ice after flare-ups. This information is not intended to replace advice given to you by your health care provider. Make sure you discuss any questions you have with your health care provider. Document Revised: 12/12/2019 Document Reviewed: 12/12/2019 Elsevier Patient Education  2022 Elsevier Inc.  

## 2021-12-25 NOTE — Assessment & Plan Note (Signed)
Stable today with Metoprolol on board, as started by cardiology.  Will place referral to get her into Dr. Pernell Dupre at [facility] to further assess with her underlying Drue Dun.

## 2021-12-25 NOTE — Assessment & Plan Note (Signed)
Chronic, ongoing, with Drue Dun.  At this time continue Flexeril as needed.  Referral to Dr. Sunday Shams at [facility].

## 2021-12-27 ENCOUNTER — Encounter: Payer: Self-pay | Admitting: Nurse Practitioner

## 2021-12-27 DIAGNOSIS — M5137 Other intervertebral disc degeneration, lumbosacral region: Secondary | ICD-10-CM

## 2021-12-27 DIAGNOSIS — Q7962 Hypermobile Ehlers-Danlos syndrome: Secondary | ICD-10-CM

## 2021-12-27 DIAGNOSIS — E282 Polycystic ovarian syndrome: Secondary | ICD-10-CM

## 2021-12-27 DIAGNOSIS — M47812 Spondylosis without myelopathy or radiculopathy, cervical region: Secondary | ICD-10-CM

## 2021-12-27 DIAGNOSIS — Z3169 Encounter for other general counseling and advice on procreation: Secondary | ICD-10-CM

## 2021-12-27 LAB — COMPREHENSIVE METABOLIC PANEL
ALT: 23 IU/L (ref 0–32)
AST: 16 IU/L (ref 0–40)
Albumin/Globulin Ratio: 3 — ABNORMAL HIGH (ref 1.2–2.2)
Albumin: 5.1 g/dL — ABNORMAL HIGH (ref 3.8–4.8)
Alkaline Phosphatase: 57 IU/L (ref 44–121)
BUN/Creatinine Ratio: 17 (ref 9–23)
BUN: 13 mg/dL (ref 6–20)
Bilirubin Total: 0.3 mg/dL (ref 0.0–1.2)
CO2: 22 mmol/L (ref 20–29)
Calcium: 9.7 mg/dL (ref 8.7–10.2)
Chloride: 104 mmol/L (ref 96–106)
Creatinine, Ser: 0.75 mg/dL (ref 0.57–1.00)
Globulin, Total: 1.7 g/dL (ref 1.5–4.5)
Glucose: 92 mg/dL (ref 70–99)
Potassium: 4.6 mmol/L (ref 3.5–5.2)
Sodium: 141 mmol/L (ref 134–144)
Total Protein: 6.8 g/dL (ref 6.0–8.5)
eGFR: 104 mL/min/{1.73_m2} (ref >59)

## 2021-12-27 LAB — CBC WITH DIFFERENTIAL/PLATELET
Basophils Absolute: 0 10*3/uL (ref 0.0–0.2)
Basos: 1 %
EOS (ABSOLUTE): 0.1 10*3/uL (ref 0.0–0.4)
Eos: 1 %
Hematocrit: 42.6 % (ref 34.0–46.6)
Hemoglobin: 14.5 g/dL (ref 11.1–15.9)
Immature Grans (Abs): 0 10*3/uL (ref 0.0–0.1)
Immature Granulocytes: 0 %
Lymphocytes Absolute: 2 10*3/uL (ref 0.7–3.1)
Lymphs: 33 %
MCH: 31.8 pg (ref 26.6–33.0)
MCHC: 34 g/dL (ref 31.5–35.7)
MCV: 93 fL (ref 79–97)
Monocytes Absolute: 0.3 10*3/uL (ref 0.1–0.9)
Monocytes: 4 %
Neutrophils Absolute: 3.7 10*3/uL (ref 1.4–7.0)
Neutrophils: 61 %
Platelets: 316 10*3/uL (ref 150–450)
RBC: 4.56 x10E6/uL (ref 3.77–5.28)
RDW: 12.3 % (ref 11.7–15.4)
WBC: 6 10*3/uL (ref 3.4–10.8)

## 2021-12-27 LAB — MAGNESIUM: Magnesium: 2.1 mg/dL (ref 1.6–2.3)

## 2021-12-27 LAB — VITAMIN B12: Vitamin B-12: 527 pg/mL (ref 232–1245)

## 2021-12-27 LAB — TSH: TSH: 0.866 u[IU]/mL (ref 0.450–4.500)

## 2021-12-27 LAB — HEMOGLOBIN A1C
Est. average glucose Bld gHb Est-mCnc: 103 mg/dL
Hgb A1c MFr Bld: 5.2 % (ref 4.8–5.6)

## 2021-12-27 NOTE — Progress Notes (Signed)
Contacted via [place] morning Stacy West, your labs have returned and are overall stable.  I see where you spoke with Dr. Cristy West and he is not taking new patients until October 2023, I would would still get on his list.  I feel he would be a great provider for you to see and someone who specializes in this.  The two providers I placed referrals to are a cardiologist who specializes specifically in POTS + connectives tissue disorders.  The other is a rheumatologist who has knowledge of Ehlers Danlos.  With this disease process we really need a multidisciplinary approach of people who understand your disease process.  Do you want me to place a referral to a GYN at [facility] too, that may be beneficial?

## 2021-12-28 DIAGNOSIS — M5412 Radiculopathy, cervical region: Secondary | ICD-10-CM | POA: Diagnosis not present

## 2021-12-28 DIAGNOSIS — M9901 Segmental and somatic dysfunction of cervical region: Secondary | ICD-10-CM | POA: Diagnosis not present

## 2021-12-28 DIAGNOSIS — M9902 Segmental and somatic dysfunction of thoracic region: Secondary | ICD-10-CM | POA: Diagnosis not present

## 2021-12-28 DIAGNOSIS — R519 Headache, unspecified: Secondary | ICD-10-CM | POA: Diagnosis not present

## 2021-12-28 NOTE — Addendum Note (Signed)
Addended by: Marnee Guarneri T on: 12/28/2021 02:12 PM   Modules accepted: Orders

## 2021-12-30 ENCOUNTER — Encounter: Payer: Self-pay | Admitting: Nurse Practitioner

## 2022-01-01 ENCOUNTER — Encounter: Payer: Self-pay | Admitting: Nurse Practitioner

## 2022-01-02 DIAGNOSIS — M5412 Radiculopathy, cervical region: Secondary | ICD-10-CM | POA: Diagnosis not present

## 2022-01-02 DIAGNOSIS — M9902 Segmental and somatic dysfunction of thoracic region: Secondary | ICD-10-CM | POA: Diagnosis not present

## 2022-01-02 DIAGNOSIS — M9901 Segmental and somatic dysfunction of cervical region: Secondary | ICD-10-CM | POA: Diagnosis not present

## 2022-01-02 DIAGNOSIS — R519 Headache, unspecified: Secondary | ICD-10-CM | POA: Diagnosis not present

## 2022-01-03 NOTE — Patient Instructions (Signed)

## 2022-01-06 ENCOUNTER — Other Ambulatory Visit: Payer: Self-pay

## 2022-01-06 ENCOUNTER — Ambulatory Visit (INDEPENDENT_AMBULATORY_CARE_PROVIDER_SITE_OTHER): Payer: Medicare Other | Admitting: Nurse Practitioner

## 2022-01-06 ENCOUNTER — Encounter: Payer: Self-pay | Admitting: Nurse Practitioner

## 2022-01-06 VITALS — BP 102/72 | HR 86 | Temp 98.6°F | Wt 203.0 lb

## 2022-01-06 DIAGNOSIS — R8281 Pyuria: Secondary | ICD-10-CM | POA: Diagnosis not present

## 2022-01-06 DIAGNOSIS — R399 Unspecified symptoms and signs involving the genitourinary system: Secondary | ICD-10-CM | POA: Diagnosis not present

## 2022-01-06 LAB — WET PREP FOR TRICH, YEAST, CLUE
Clue Cell Exam: NEGATIVE
Trichomonas Exam: NEGATIVE
Yeast Exam: NEGATIVE

## 2022-01-06 LAB — MICROSCOPIC EXAMINATION

## 2022-01-06 LAB — URINALYSIS, ROUTINE W REFLEX MICROSCOPIC
Bilirubin, UA: NEGATIVE
Glucose, UA: NEGATIVE
Ketones, UA: NEGATIVE
Nitrite, UA: NEGATIVE
Protein,UA: NEGATIVE
Specific Gravity, UA: 1.02 (ref 1.005–1.030)
Urobilinogen, Ur: 0.2 mg/dL (ref 0.2–1.0)
pH, UA: 6 (ref 5.0–7.5)

## 2022-01-06 MED ORDER — CYCLOBENZAPRINE HCL 10 MG PO TABS: 10.0000 mg | ORAL_TABLET | Freq: Three times a day (TID) | ORAL | 4 refills | Status: DC | PRN

## 2022-01-06 MED ORDER — CEPHALEXIN 500 MG PO CAPS: 500.0000 mg | ORAL_CAPSULE | Freq: Four times a day (QID) | ORAL | 0 refills | Status: AC

## 2022-01-06 NOTE — Assessment & Plan Note (Signed)
Acute for one week -- at this time urine noting 2+ blood (just finished cycle), 3+ Leuks, many bacteria.  Wet prep negative.  Will treat with Keflex at this time, avoid Cipro in [place] patients.  Discussed with patient.  Urine sent for culture and will adjust medication as needed.  Return as scheduled and will recheck next visit.

## 2022-01-06 NOTE — Progress Notes (Signed)
BP 102/72    Pulse 86    Temp 98.6 F (37 C) (Oral)    Wt 203 lb (92.1 kg)    LMP 12/25/2021 (Exact Date)    BMI 29.98 kg/m    Subjective:    Patient ID: Stacy West, female    DOB: January 12, 1981, 41 y.o.   MRN: [id number]  HPI: Stacy West is a 41 y.o. female  Chief Complaint  Patient presents with   Urinary Tract Infection    Patient is here for possible urinary tract infection. Patient states she is having vaginal pressure and vaginal irritation. Patient states she became symptomatic over a week ago. Patient denies trying any medication over the counter.    Back Pain    Patient states she is having back pain throughout the day, all day in the shoulder and lumbar area. Patient states is prescribed Flexeril to take the medication at night to help with the spasms and to help her sleep.    URINARY SYMPTOMS Symptoms started one week ago with more pressure. Dysuria: no Urinary frequency: no Urgency: yes Small volume voids: yes Symptom severity: yes Urinary incontinence: yes Foul odor: no Hematuria: no Abdominal pain: yes Back pain: yes with urine infection and at baseline, Flexeril refills Suprapubic pain/pressure: yes Flank pain: no Fever:  no Vomiting: yes Status: stable Previous urinary tract infection: yes Recurrent urinary tract infection: no Sexual activity: monogamous History of sexually transmitted disease: no Treatments attempted: increasing fluids    Relevant past medical, surgical, family and social history reviewed and updated as indicated. Interim medical history since our last visit reviewed. Allergies and medications reviewed and updated.  Review of Systems  Constitutional:  Negative for activity change, appetite change, diaphoresis, fatigue and fever.  Respiratory:  Negative for cough, chest tightness, shortness of breath and wheezing.   Cardiovascular:  Negative for chest pain, palpitations and leg swelling.  Gastrointestinal: Negative.    Genitourinary:  Positive for decreased urine volume, dysuria and hematuria. Negative for flank pain and frequency.  Neurological: Negative.   Psychiatric/Behavioral:  Positive for sleep disturbance. Negative for decreased concentration, self-injury and suicidal ideas. The patient is nervous/anxious.    Per HPI unless specifically indicated above     Objective:    BP 102/72    Pulse 86    Temp 98.6 F (37 C) (Oral)    Wt 203 lb (92.1 kg)    LMP 12/25/2021 (Exact Date)    BMI 29.98 kg/m   Wt Readings from Last 3 Encounters:  01/06/22 203 lb (92.1 kg)  12/25/21 203 lb 6.4 oz (92.3 kg)  10/14/21 193 lb (87.5 kg)    Physical Exam Vitals and nursing note reviewed.  Constitutional:      General: She is awake. She is not in acute distress.    Appearance: She is well-developed and well-groomed. She is obese. She is not ill-appearing or toxic-appearing.  HENT:     Head: Normocephalic.     Right Ear: Hearing normal.     Left Ear: Hearing normal.  Eyes:     General: Lids are normal.        Right eye: No discharge.        Left eye: No discharge.     Conjunctiva/sclera: Conjunctivae normal.     Pupils: Pupils are equal, round, and reactive to light.  Neck:     Thyroid: No thyromegaly.     Vascular: No carotid bruit.  Cardiovascular:     Rate and Rhythm: Normal rate  and regular rhythm.     Heart sounds: Normal heart sounds. No murmur heard.   No gallop.  Pulmonary:     Effort: Pulmonary effort is normal. No accessory muscle usage or respiratory distress.     Breath sounds: Normal breath sounds.  Abdominal:     General: Bowel sounds are normal.     Palpations: Abdomen is soft. There is no hepatomegaly or splenomegaly.  Musculoskeletal:     Cervical back: Normal range of motion and neck supple.     Right lower leg: No edema.     Left lower leg: No edema.  Lymphadenopathy:     Head:     Right side of head: No submental, submandibular, tonsillar, preauricular or posterior auricular  adenopathy.     Left side of head: No submental, submandibular, tonsillar, preauricular or posterior auricular adenopathy.     Cervical: No cervical adenopathy.  Skin:    General: Skin is warm and dry.  Neurological:     Mental Status: She is alert and oriented to person, place, and time.     Cranial Nerves: Cranial nerves 2-12 are intact.     Deep Tendon Reflexes:     Reflex Scores:      Brachioradialis reflexes are 1+ on the right side and 1+ on the left side.      Patellar reflexes are 1+ on the right side and 1+ on the left side. Psychiatric:        Attention and Perception: Attention normal.        Mood and Affect: Mood normal.        Speech: Speech normal.        Behavior: Behavior normal. Behavior is cooperative.        Thought Content: Thought content normal.    Results for orders placed or performed in visit on 01/06/22  Microscopic Examination  Result Value Ref Range   WBC, UA 11-30 (A) 0 - 5 /hpf   RBC 11-30 (A) 0 - 2 /hpf   Epithelial Cells (non renal) 0-10 0 - 10 /hpf   Bacteria, UA Many (A) None seen/Few  WET PREP FOR TRICH, YEAST, CLUE   Urine  Result Value Ref Range   Trichomonas Exam Negative Negative   Yeast Exam Negative Negative   Clue Cell Exam Negative Negative  Urinalysis, Routine w reflex microscopic  Result Value Ref Range   Specific Gravity, UA 1.020 1.005 - 1.030   pH, UA 6.0 5.0 - 7.5   Color, UA Yellow Yellow   Appearance Ur Cloudy (A) Clear   Leukocytes,UA 3+ (A) Negative   Protein,UA Negative Negative/Trace   Glucose, UA Negative Negative   Ketones, UA Negative Negative   RBC, UA 2+ (A) Negative   Bilirubin, UA Negative Negative   Urobilinogen, Ur 0.2 0.2 - 1.0 mg/dL   Nitrite, UA Negative Negative   Microscopic Examination See below:       Assessment & Plan:   Problem List Items Addressed This Visit       Other   Urinary symptom or sign - Primary    Acute for one week -- at this time urine noting 2+ blood (just finished cycle),  3+ Leuks, many bacteria.  Wet prep negative.  Will treat with Keflex at this time, avoid Cipro in [place] patients.  Discussed with patient.  Urine sent for culture and will adjust medication as needed.  Return as scheduled and will recheck next visit.      Relevant Orders  Urinalysis, Routine w reflex microscopic (Completed)   WET PREP FOR TRICH, YEAST, CLUE   Other Visit Diagnoses     Pyuria       Urine sent for culture.   Relevant Orders   Urine Culture        Follow up plan: Return if symptoms worsen or fail to improve.

## 2022-01-07 DIAGNOSIS — M9902 Segmental and somatic dysfunction of thoracic region: Secondary | ICD-10-CM | POA: Diagnosis not present

## 2022-01-07 DIAGNOSIS — M5412 Radiculopathy, cervical region: Secondary | ICD-10-CM | POA: Diagnosis not present

## 2022-01-07 DIAGNOSIS — M9901 Segmental and somatic dysfunction of cervical region: Secondary | ICD-10-CM | POA: Diagnosis not present

## 2022-01-07 DIAGNOSIS — R519 Headache, unspecified: Secondary | ICD-10-CM | POA: Diagnosis not present

## 2022-01-09 LAB — URINE CULTURE

## 2022-01-10 NOTE — Progress Notes (Signed)
Contacted via MyChart   Good morning Stacy West, your urine is showing coverage with Keflex as ordered.  Continue this.  I also was doing more research over weekend and found another close by resource, close by being [place], but the owner of this company has EDS.  I highly recommend checking out this link and reaching out to them, as they may have more local resources too. Also at the bottom of this page when you go to it there are also links for things you can purchase to help with pain, one is an Orthoptist who does wrist and hand supports that are really cool.  Check them out. [email or web address]

## 2022-01-11 DIAGNOSIS — R002 Palpitations: Secondary | ICD-10-CM | POA: Diagnosis not present

## 2022-01-11 DIAGNOSIS — I341 Nonrheumatic mitral (valve) prolapse: Secondary | ICD-10-CM | POA: Diagnosis not present

## 2022-01-11 DIAGNOSIS — Q796 Ehlers-Danlos syndrome, unspecified: Secondary | ICD-10-CM | POA: Diagnosis not present

## 2022-01-14 DIAGNOSIS — M5412 Radiculopathy, cervical region: Secondary | ICD-10-CM | POA: Diagnosis not present

## 2022-01-14 DIAGNOSIS — M9902 Segmental and somatic dysfunction of thoracic region: Secondary | ICD-10-CM | POA: Diagnosis not present

## 2022-01-14 DIAGNOSIS — R519 Headache, unspecified: Secondary | ICD-10-CM | POA: Diagnosis not present

## 2022-01-14 DIAGNOSIS — M9901 Segmental and somatic dysfunction of cervical region: Secondary | ICD-10-CM | POA: Diagnosis not present

## 2022-01-19 ENCOUNTER — Telehealth: Payer: Self-pay | Admitting: Nurse Practitioner

## 2022-01-19 DIAGNOSIS — Q7962 Hypermobile Ehlers-Danlos syndrome: Secondary | ICD-10-CM

## 2022-01-19 DIAGNOSIS — E282 Polycystic ovarian syndrome: Secondary | ICD-10-CM

## 2022-01-19 NOTE — Telephone Encounter (Signed)
Copied from [place] [id number]. Topic: Referral - Request for Referral ?>> Jan 19, 2022 10:19 AM Stacy West wrote: ?Has patient seen PCP for this complaint? Yes  ?*If NO, is insurance requiring patient see PCP for this issue before PCP can refer them? 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

## 2022-01-19 NOTE — Telephone Encounter (Signed)
Copied from [place] [id number]. Topic: General - Other ?>> Jan 19, 2022 10:23 AM Alanda Slim E wrote: ?Reason for CRM: pt wants to make sure provider is keeping in touch with Palliative care / please advise ?

## 2022-01-20 ENCOUNTER — Telehealth: Payer: Self-pay | Admitting: Nurse Practitioner

## 2022-01-20 DIAGNOSIS — Z515 Encounter for palliative care: Secondary | ICD-10-CM

## 2022-01-20 NOTE — Telephone Encounter (Signed)
Patient was made aware of [name]'s previous message. Patient is concerned about being dismissed from Palliative Care. Patient states she just spoke with them and was told that they spoke with [name] and she does not support patient with Palliative Care. Patient states she was informed that [name] told PC that patient has missed appointments and patient says she has made both appointments she has had with provider. Please advise as patient is really concerned.  ?

## 2022-01-20 NOTE — Telephone Encounter (Addendum)
I called Ms. Oberle to discuss d/c from [facility] as discussed with Marnee Guarneri NP, Primary. Ms. Tagliaferri endorses Dr Jeananne Rama office was calling her, said she would call this provider back and hung up on this provider. Ms. Deihl called back about 5 minutes later. Attempted to explain to Ms. Pekala reasons for PC is signing off case as best to defer concerns to one provider primary care. See primary care notes for compliance. Attempted to explains reasons, Ms. Lynds became upset, attempted to argue, hostile, defensive. Provider verbalized was going to end call due Ms Pinnix increase in negative verbal responses, Ms. Macnair replied "you should end call".  ? ?Time spent 10 minutes ?

## 2022-01-20 NOTE — Progress Notes (Signed)
PC SW received VM from patient requesting return call in reference to needing assistance with referrals, requesting medical records from [place] and possible in home assistance.  ? ?SW attempted to outreach by phone, but call continuously dropped. SW emailed patient instead to [email or web address], addressing VM request actions.  ? ?SW informed patient of the following: ?"In response to your voicemail, I'm sorry to hear that you're having difficulty with referrals to different specialist, it seems like [name], is trying to assist in tackling this with you. [id number] and I don't really have as much leverage or experience with such referrals as [name] would/does. ? ?In regard to requiring assistance in the home. It looks you have Medicaid now, is this accurate? ?If so, Medicaid is a able to cover a program called [facility]). P[facility] Medicaid benefit based on the need for assistance with Activities of Daily Living (ADLs). The ADLs are bathing, dressing, toileting, eating, and transferring/functional mobility in the home. If this is something you think you'd be interested in, ill happy to start the application and send to [facility] for a provider signature." ? ?SW is awaiting response from patient in regard to processing a PCS application at this time.  ? ?Medical team is aware.  ? ?

## 2022-01-20 NOTE — Addendum Note (Signed)
Addended by: Marnee Guarneri T on: 01/20/2022 12:14 PM ? ? Modules accepted: Orders ? ?

## 2022-01-21 ENCOUNTER — Telehealth: Payer: Self-pay | Admitting: Nurse Practitioner

## 2022-01-21 ENCOUNTER — Ambulatory Visit: Payer: Self-pay | Admitting: *Deleted

## 2022-01-21 DIAGNOSIS — M9902 Segmental and somatic dysfunction of thoracic region: Secondary | ICD-10-CM | POA: Diagnosis not present

## 2022-01-21 DIAGNOSIS — R519 Headache, unspecified: Secondary | ICD-10-CM | POA: Diagnosis not present

## 2022-01-21 DIAGNOSIS — M9901 Segmental and somatic dysfunction of cervical region: Secondary | ICD-10-CM | POA: Diagnosis not present

## 2022-01-21 DIAGNOSIS — M5412 Radiculopathy, cervical region: Secondary | ICD-10-CM | POA: Diagnosis not present

## 2022-01-21 NOTE — Telephone Encounter (Signed)
Spoke with patient to inform her of [name]'s prior message. Patient was not understanding what was going on with her Palliative Care. Answered all questions patient had during phone call. Patient states she is still in a lot of pain and still needs palliative care. Patient states she was going to try and find another palliative care facility. Advise patient to give our office a call back if there is anything else our office can do to help her. Patient verbalized understanding and apologized for her frustrations towards [place] from the miscommunication.  ?

## 2022-01-21 NOTE — Telephone Encounter (Signed)
Home Health Verbal Orders - Caller/Agency: ross Nissen ?Callback [phone] ?Requesting pt and home health ?Frequency: [facility] for fx [phone], pt [phone] fx  ?

## 2022-01-21 NOTE — Telephone Encounter (Signed)
Left a message for patient to give our office a call back to discuss Stacy West's message. ? ?OK for PEC/Nurse Triage to give message if patient calls back.  ?

## 2022-01-21 NOTE — Telephone Encounter (Signed)
Reason for Disposition ? [1] Caller requesting NON-URGENT health information AND [2] PCP's office is the best resource ?   Call warm transferred into the office. ? ?Answer Assessment - Initial Assessment Questions ?1. REASON FOR CALL or QUESTION: "What is your reason for calling today?" or "How can I best help you?" or "What question do you have that I can help answer?" ?    Pt calling in upset that Marnee Guarneri, NP is dismissing her from [facility] and saying she has missed appts.   Pt replied,   "I have not missed appts and I need this service".   "I don't understand why they are saying these things and that I don't need palliative care".   "I'm in a lot of pain and need their services".   "I'm really upset about all of this". ? ?I warm transferred the call into [facility].   Alexis answered the phone and then got Chastity on the line with the pt.    I then excited the conversation at this point. ? ?Protocols used: Information Only Call - No Triage-A-AH ? ?

## 2022-01-22 ENCOUNTER — Telehealth: Payer: Medicare Other | Admitting: Nurse Practitioner

## 2022-01-22 ENCOUNTER — Ambulatory Visit: Payer: Medicare Other | Admitting: Nurse Practitioner

## 2022-01-22 DIAGNOSIS — M9902 Segmental and somatic dysfunction of thoracic region: Secondary | ICD-10-CM | POA: Diagnosis not present

## 2022-01-22 DIAGNOSIS — M5412 Radiculopathy, cervical region: Secondary | ICD-10-CM | POA: Diagnosis not present

## 2022-01-22 DIAGNOSIS — R519 Headache, unspecified: Secondary | ICD-10-CM | POA: Diagnosis not present

## 2022-01-22 DIAGNOSIS — M9901 Segmental and somatic dysfunction of cervical region: Secondary | ICD-10-CM | POA: Diagnosis not present

## 2022-01-22 NOTE — Telephone Encounter (Signed)
Spoke Ross to provided OK for verbal for the patient. Ross verbalized understanding and has no further questions at this time.  ?

## 2022-01-26 DIAGNOSIS — R519 Headache, unspecified: Secondary | ICD-10-CM | POA: Diagnosis not present

## 2022-01-26 DIAGNOSIS — M9901 Segmental and somatic dysfunction of cervical region: Secondary | ICD-10-CM | POA: Diagnosis not present

## 2022-01-26 DIAGNOSIS — M9902 Segmental and somatic dysfunction of thoracic region: Secondary | ICD-10-CM | POA: Diagnosis not present

## 2022-01-26 DIAGNOSIS — M5412 Radiculopathy, cervical region: Secondary | ICD-10-CM | POA: Diagnosis not present

## 2022-02-01 ENCOUNTER — Encounter: Payer: Self-pay | Admitting: Nurse Practitioner

## 2022-02-02 ENCOUNTER — Encounter: Payer: Self-pay | Admitting: Nurse Practitioner

## 2022-02-02 DIAGNOSIS — K900 Celiac disease: Secondary | ICD-10-CM

## 2022-02-02 DIAGNOSIS — K641 Second degree hemorrhoids: Secondary | ICD-10-CM

## 2022-02-02 DIAGNOSIS — Q7962 Hypermobile Ehlers-Danlos syndrome: Secondary | ICD-10-CM

## 2022-02-03 ENCOUNTER — Ambulatory Visit (INDEPENDENT_AMBULATORY_CARE_PROVIDER_SITE_OTHER): Payer: Medicare Other | Admitting: *Deleted

## 2022-02-03 DIAGNOSIS — Z0000 Encounter for general adult medical examination without abnormal findings: Secondary | ICD-10-CM | POA: Diagnosis not present

## 2022-02-03 NOTE — Patient Instructions (Signed)
Stacy West , ?Thank you for taking time to come for your Medicare Wellness Visit. I appreciate your ongoing commitment to your health goals. Please review the following plan we discussed and let me know if I can assist you in the future.  ? ?Screening recommendations/referrals: ?Colonoscopy: not required ?Mammogram: not required ?Bone Density: not required ?Recommended yearly ophthalmology/optometry visit for glaucoma screening and checkup ?Recommended yearly dental visit for hygiene and checkup ? ?Vaccinations: ?Influenza vaccine: declined ?Pneumococcal vaccine: not required ?Tdap vaccine: Education provided ?Shingles vaccine: not required   ? ?Advanced directives: Education provided ? ?Conditions/risks identified:  ? ?Next appointment:  ? ? ?Preventive Care 30-45 Female ?Preventive care refers to lifestyle choices and visits with your health care provider that can promote health and wellness. ?What does preventive care include? ?A yearly physical exam. This is also called an annual well check. ?Dental exams once or twice a year. ?Routine eye exams. Ask your health care provider how often you should have your eyes checked. ?Personal lifestyle choices, including: ?Daily care of your teeth and gums. ?Regular physical activity. ?Eating a healthy diet. ?Avoiding tobacco and drug use. ?Limiting alcohol use. ?Practicing safe sex. ?Taking low-dose aspirin every day. ?Taking vitamin and mineral supplements as recommended by your health care provider. ?What happens during an annual well check? ?The services and screenings done by your health care provider during your annual well check will depend on your age, overall health, lifestyle risk factors, and family history of disease. ?Counseling  ?Your health care provider may ask you questions about your: ?Alcohol use. ?Tobacco use. ?Drug use. ?Emotional well-being. ?Home and relationship well-being. ?Sexual activity. ?Eating habits. ?History of falls. ?Memory and ability to  understand (cognition). ?Work and work Statistician. ?Reproductive health. ?Screening  ?You may have the following tests or measurements: ?Height, weight, and BMI. ?Blood pressure. ?Lipid and cholesterol levels. These may be checked every 5 years, or more frequently if you are over 29 years old. ?Skin check. ?Lung cancer screening. You may have this screening every year starting at age 49 if you have a 30-pack-year history of smoking and currently smoke or have quit within the past 15 years. ?Fecal occult blood test (FOBT) of the stool. You may have this test every year starting at age 95. ?Flexible sigmoidoscopy or colonoscopy. You may have a sigmoidoscopy every 5 years or a colonoscopy every 10 years starting at age 42. ?Hepatitis C blood test. ?Hepatitis B blood test. ?Sexually transmitted disease (STD) testing. ?Diabetes screening. This is done by checking your blood sugar (glucose) after you have not eaten for a while (fasting). You may have this done every 1-3 years. ?Bone density scan. This is done to screen for osteoporosis. You may have this done starting at age 31. ?Mammogram. This may be done every 1-2 years. Talk to your health care provider about how often you should have regular mammograms. ?Talk with your health care provider about your test results, treatment options, and if necessary, the need for more tests. ?Vaccines  ?Your health care provider may recommend certain vaccines, such as: ?Influenza vaccine. This is recommended every year. ?Tetanus, diphtheria, and acellular pertussis (Tdap, Td) vaccine. You may need a Td booster every 10 years. ?Zoster vaccine. You may need this after age 8. ?Pneumococcal 13-valent conjugate (PCV13) vaccine. One dose is recommended after age 10. ?Pneumococcal polysaccharide (PPSV23) vaccine. One dose is recommended after age 60. ?Talk to your health care provider about which screenings and vaccines you need and how often you need them. ?  This information is not  intended to replace advice given to you by your health care provider. Make sure you discuss any questions you have with your health care provider. ?Document Released: 11/28/2015 Document Revised: 07/21/2016 Document Reviewed: 09/02/2015 ?Elsevier Interactive Patient Education ? 2017 [place]. ? ?Fall Prevention in the Home ?Falls can cause injuries. They can happen to people of all ages. There are many things you can do to make your home safe and to help prevent falls. ?What can I do on the outside of my home? ?Regularly fix the edges of walkways and driveways and fix any cracks. ?Remove anything that might make you trip as you walk through a door, such as a raised step or threshold. ?Trim any bushes or trees on the path to your home. ?Use bright outdoor lighting. ?Clear any walking paths of anything that might make someone trip, such as rocks or tools. ?Regularly check to see if handrails are loose or broken. Make sure that both sides of any steps have handrails. ?Any raised decks and porches should have guardrails on the edges. ?Have any leaves, snow, or ice cleared regularly. ?Use sand or salt on walking paths during winter. ?Clean up any spills in your garage right away. This includes oil or grease spills. ?What can I do in the bathroom? ?Use night lights. ?Install grab bars by the toilet and in the tub and shower. Do not use towel bars as grab bars. ?Use non-skid mats or decals in the tub or shower. ?If you need to sit down in the shower, use a plastic, non-slip stool. ?Keep the floor dry. Clean up any water that spills on the floor as soon as it happens. ?Remove soap buildup in the tub or shower regularly. ?Attach bath mats securely with double-sided non-slip rug tape. ?Do not have throw rugs and other things on the floor that can make you trip. ?What can I do in the bedroom? ?Use night lights. ?Make sure that you have a light by your bed that is easy to reach. ?Do not use any sheets or blankets that are  too big for your bed. They should not hang down onto the floor. ?Have a firm chair that has side arms. You can use this for support while you get dressed. ?Do not have throw rugs and other things on the floor that can make you trip. ?What can I do in the kitchen? ?Clean up any spills right away. ?Avoid walking on wet floors. ?Keep items that you use a lot in easy-to-reach places. ?If you need to reach something above you, use a strong step stool that has a grab bar. ?Keep electrical cords out of the way. ?Do not use floor polish or wax that makes floors slippery. If you must use wax, use non-skid floor wax. ?Do not have throw rugs and other things on the floor that can make you trip. ?What can I do with my stairs? ?Do not leave any items on the stairs. ?Make sure that there are handrails on both sides of the stairs and use them. Fix handrails that are broken or loose. Make sure that handrails are as long as the stairways. ?Check any carpeting to make sure that it is firmly attached to the stairs. Fix any carpet that is loose or worn. ?Avoid having throw rugs at the top or bottom of the stairs. If you do have throw rugs, attach them to the floor with carpet tape. ?Make sure that you have a light switch at the  top of the stairs and the bottom of the stairs. If you do not have them, ask someone to add them for you. ?What else can I do to help prevent falls? ?Wear shoes that: ?Do not have high heels. ?Have rubber bottoms. ?Are comfortable and fit you well. ?Are closed at the toe. Do not wear sandals. ?If you use a stepladder: ?Make sure that it is fully opened. Do not climb a closed stepladder. ?Make sure that both sides of the stepladder are locked into place. ?Ask someone to hold it for you, if possible. ?Clearly mark and make sure that you can see: ?Any grab bars or handrails. ?First and last steps. ?Where the edge of each step is. ?Use tools that help you move around (mobility aids) if they are needed. These  include: ?Canes. ?Walkers. ?Scooters. ?Crutches. ?Turn on the lights when you go into a dark area. Replace any light bulbs as soon as they burn out. ?Set up your furniture so you have a clear path. Avoid moving your

## 2022-02-03 NOTE — Progress Notes (Signed)
? ?Subjective:  ? Stacy West is a 41 y.o. female who presents for Medicare Annual (Subsequent) preventive examination. ? ?I connected with  Stacy West on 02/03/22 by a telephone enabled telemedicine application and verified that I am speaking with the correct person using two identifiers. ?  ?I discussed the limitations of evaluation and management by telemedicine. The patient expressed understanding and agreed to proceed. ? ?Patient location: home ? ?Provider location: Tele-health not in office ? ?Tele-health not in office  ? ?Review of Systems    ? ?Cardiac Risk Factors include: advanced age (>41mn, >>15women);family history of premature cardiovascular disease ? ?   ?Objective:  ?  ?Today's Vitals  ? 02/03/22 0817 02/03/22 0826  ?PainSc: 4  3   ? ?There is no height or weight on file to calculate BMI. ? ? ?  02/03/2022  ?  8:29 AM 05/06/2020  ?  7:55 AM 01/30/2020  ?  9:15 AM 01/17/2020  ? 11:32 AM 05/25/2019  ? 12:44 PM  ?Advanced Directives  ?Does Patient Have a Medical Advance Directive? No No No No No  ?Would patient like information on creating a medical advance directive? No - Patient declined No - Patient declined No - Patient declined No - Patient declined No - Patient declined  ? ? ?Current Medications (verified) ?Outpatient Encounter Medications as of 02/03/2022  ?Medication Sig  ? cyclobenzaprine (FLEXERIL) 10 MG tablet Take 1 tablet (10 mg total) by mouth 3 (three) times daily as needed for muscle spasms.  ? glipiZIDE (GLUCOTROL) 5 MG tablet Take 1 tablet (5 mg total) by mouth daily before breakfast.  ? medroxyPROGESTERone (PROVERA) 10 MG tablet TAKE 2 TABLETS (20 MG TOTAL) BY MOUTH DAILY. USE FOR 14 DAYS EACH MONTH  ? metoprolol tartrate (LOPRESSOR) 25 MG tablet Take 25 mg by mouth daily.  ? omeprazole (PRILOSEC) 20 MG capsule Take by mouth.  ? ?No facility-administered encounter medications on file as of 02/03/2022.  ? ? ?Allergies (verified) ?Carisoprodol-aspirin-codeine, Carisoprodol, Percocet   [oxycodone-acetaminophen], and Gluten meal  ? ?History: ?Past Medical History:  ?Diagnosis Date  ? Abnormal Pap smear of cervix   ? Allergy   ? Depression   ? Ehlers-Danlos disease   ? Fibromyalgia   ? Heart murmur   ? Insulin resistance   ? Miscarriage   ? Other cervical disc degeneration at C4-C5 level   ? PCOS (polycystic ovarian syndrome)   ? PCOS (polycystic ovarian syndrome)   ? Premature ovarian failure   ? SI (sacroiliac) joint dysfunction   ? Sleep apnea   ? Thyroid disease   ? ?Past Surgical History:  ?Procedure Laterality Date  ? ADENOIDECTOMY    ? BACK SURGERY    ? COLPOSCOPY    ? FINGER SURGERY Left   ? NECK SURGERY  07/2020  ? TONSILLECTOMY    ? WISDOM TOOTH EXTRACTION    ? ?Family History  ?Problem Relation Age of Onset  ? Heart murmur Mother   ? Asthma Mother   ? Skin cancer Father   ? COPD Father   ? Heart murmur Maternal Aunt   ? Breast cancer Maternal Aunt   ? Bipolar disorder Brother   ? Depression Brother   ? Alcoholism Brother   ? Depression Brother   ? Early menopause Maternal Aunt   ? ?Social History  ? ?Socioeconomic History  ? Marital status: Married  ?  Spouse name: Not on file  ? Number of children: 1  ? Years of education: Not on  file  ? Highest education level: Not on file  ?Occupational History  ? Not on file  ?Tobacco Use  ? Smoking status: Former  ?  Types: Cigarettes  ?  Quit date: 2018  ?  Years since quitting: 5.2  ? Smokeless tobacco: Never  ?Vaping Use  ? Vaping Use: Never used  ?Substance and Sexual Activity  ? Alcohol use: Not Currently  ? Drug use: Not Currently  ? Sexual activity: Yes  ?  Birth control/protection: None  ?Other Topics Concern  ? Not on file  ?Social History Narrative  ? Not on file  ? ?Social Determinants of Health  ? ?Financial Resource Strain: Low Risk   ? Difficulty of Paying Living Expenses: Not very hard  ?Food Insecurity: No Food Insecurity  ? Worried About Charity fundraiser in the Last Year: Never true  ? Ran Out of Food in the Last Year: Never true   ?Transportation Needs: No Transportation Needs  ? Lack of Transportation (Medical): No  ? Lack of Transportation (Non-Medical): No  ?Physical Activity: Insufficiently Active  ? Days of Exercise per Week: 3 days  ? Minutes of Exercise per Session: 30 min  ?Stress: Stress Concern Present  ? Feeling of Stress : Very much  ?Social Connections: Socially Isolated  ? Frequency of Communication with Friends and Family: Never  ? Frequency of Social Gatherings with Friends and Family: Never  ? Attends Religious Services: Never  ? Active Member of Clubs or Organizations: No  ? Attends Archivist Meetings: Never  ? Marital Status: Married  ? ? ?Tobacco Counseling ?Counseling given: Not Answered ? ? ?Clinical Intake: ? ?Pre-visit preparation completed: Yes ? ?Pain : 0-10 ?Pain Score: 3  ?Pain Type: Chronic pain ?Pain Location: Back ?Pain Descriptors / Indicators: Constant, Burning, Aching ?Pain Onset: More than a month ago ?Pain Frequency: Constant ? ?  ? ?Nutritional Risks: None ?Diabetes: No ? ?How often do you need to have someone help you when you read instructions, pamphlets, or other written materials from your doctor or pharmacy?: 1 - Never ? ?Diabetic?  no ? ?Interpreter Needed?: No ? ?Information entered by :: Leroy Kennedy LPN ? ? ?Activities of Daily Living ? ?  02/03/2022  ?  8:30 AM  ?In your present state of health, do you have any difficulty performing the following activities:  ?Hearing? 0  ?Vision? 0  ?Difficulty concentrating or making decisions? 0  ?Walking or climbing stairs? 1  ?Dressing or bathing? 1  ?Doing errands, shopping? 1  ?Preparing Food and eating ? Y  ?Using the Toilet? N  ?In the past six months, have you accidently leaked urine? Y  ?Do you have problems with loss of bowel control? N  ?Managing your Medications? N  ?Managing your Finances? N  ?Housekeeping or managing your Housekeeping? Y  ? ? ?Patient Care Team: ?Venita Lick, NP as PCP - General (Nurse Practitioner) ? ?Indicate any  recent Medical Services you may have received from other than [facility] providers in the past year (date may be approximate). ? ?   ?Assessment:  ? This is a routine wellness examination for Stacy West. ? ?Hearing/Vision screen ?Hearing Screening - Comments:: No trouble hearing ?Vision Screening - Comments:: Up to date ?Unsure of name ? ?Dietary issues and exercise activities discussed: ?Exercise limited by: orthopedic condition(s) ? ? Goals Addressed   ? ?  ?  ?  ?  ? This Visit's Progress  ?  Patient Stated     ?  No goals  ?Get healthy ?  ? ?  ? ?Depression Screen ? ?  02/03/2022  ?  8:33 AM 12/25/2021  ?  4:50 PM 11/18/2020  ? 10:27 AM  ?PHQ 2/9 Scores  ?PHQ - 2 Score '5 4 1  '$ ?PHQ- 9 Score 9 12   ?  ?Fall Risk ? ?  02/03/2022  ?  8:27 AM 12/09/2020  ?  2:49 PM 11/18/2020  ? 10:27 AM  ?Fall Risk   ?Falls in the past year? 1 0 0  ?Number falls in past yr: 0 0   ?Injury with Fall? 0 0   ?Risk for fall due to : Impaired balance/gait    ?Follow up Falls evaluation completed;Education provided;Falls prevention discussed    ? ? ?FALL RISK PREVENTION PERTAINING TO THE HOME: ? ?Any stairs in or around the home? Yes  ?If so, are there any without handrails? No  ?Home free of loose throw rugs in walkways, pet beds, electrical cords, etc? Yes  ?Adequate lighting in your home to reduce risk of falls? Yes  ? ?ASSISTIVE DEVICES UTILIZED TO PREVENT FALLS: ? ?Life alert? No  ?Use of a cane, walker or w/c? No  ?Grab bars in the bathroom? No  ?Shower chair or bench in shower? Yes  ?Elevated toilet seat or a handicapped toilet? No  ? ?TIMED UP AND GO: ? ?Was the test performed? No .  ? ? ? ? ?Cognitive Function: ? ?Normal cognitive status assessed by direct observation by this Nurse Health Advisor. No abnormalities found.  ? ? ?  ?  ?  ? ?Immunizations ?Immunization History  ?Administered Date(s) Administered  ? Influenza,inj,Quad PF,6+ Mos 07/19/2019  ? Influenza-Unspecified 07/19/2019  ? PFIZER Comirnaty(Gray Top)Covid-19 Tri-Sucrose Vaccine  02/01/2020, 02/21/2020, 08/05/2020  ? PFIZER(Purple Top)SARS-COV-2 Vaccination 02/01/2020, 02/21/2020, 08/05/2020  ? ? ?TDAP status: Due, Education has been provided regarding the importance of this vaccine.

## 2022-02-09 ENCOUNTER — Ambulatory Visit: Payer: Medicare Other | Admitting: Internal Medicine

## 2022-02-22 ENCOUNTER — Ambulatory Visit: Payer: Self-pay

## 2022-02-22 NOTE — Telephone Encounter (Signed)
Patient called, left VM to return the call to the office to discuss symptoms with a nurse. ? ?Summary: PAtient req call back allergies 3 weeks  ? Patient called in stated that she have been fighting allergies for the past 3 weeks but nothing is working. She was informed by CMA to call back if it does not get any better or go away. No appointments available virtual 02/24/22 but patient ask to be called to be directed accordingly. Ph# [phone]   ?  ? ?

## 2022-02-22 NOTE — Telephone Encounter (Signed)
?  Chief Complaint:Cough nasal congestion ?Symptoms: cough nasal congestion ?Frequency: 3 weeks ?Pertinent Negatives: Patient denies fever, true sob ?Disposition: '[]'$ ED /'[]'$ Urgent Care (no appt availability in office) / '[x]'$ Appointment(In office/virtual)/ '[]'$  [place] [facility]/ '[]'$ Home Care/ '[]'$ Refused Recommended Disposition /'[]'$ [place] [facility]/ '[]'$  Follow-up with PCP ?Additional Notes: Pt has had cough and nasal congestion for 3 weeks. Pt believes that it is allergies. Pt has tried several otc allergy medications with no relief  ? ?Summary: PAtient req call back allergies 3 weeks  ? Patient called in stated that she have been fighting allergies for the past 3 weeks but nothing is working. She was informed by CMA to call back if it does not get any better or go away. No appointments available virtual 02/24/22 but patient ask to be called to be directed accordingly. Ph# [phone]   ?  ? ?Reason for Disposition ? [1] Nasal discharge AND [2] present > 10 days ? ?Answer Assessment - Initial Assessment Questions ?1. ONSET: "When did the cough begin?"  ?    3 weeks ?2. SEVERITY: "How bad is the cough today?"  ?    *No Answer* ?3. SPUTUM: "Describe the color of your sputum" (none, dry cough; clear, white, yellow, green) ?    clear ?4. HEMOPTYSIS: "Are you coughing up any blood?" If so ask: "How much?" (flecks, streaks, tablespoons, etc.) ?    no ?5. DIFFICULTY BREATHING: "Are you having difficulty breathing?" If Yes, ask: "How bad is it?" (e.g., mild, moderate, severe)  ?  - MILD: No SOB at rest, mild SOB with walking, speaks normally in sentences, can lie down, no retractions, pulse < 100.  ?  - MODERATE: SOB at rest, SOB with minimal exertion and prefers to sit, cannot lie down flat, speaks in phrases, mild retractions, audible wheezing, pulse 100-120.  ?  - SEVERE: Very SOB at rest, speaks in single words, struggling to breathe, sitting hunched forward, retractions, pulse > 120  ?    *No Answer* ?6. FEVER: "Do  you have a fever?" If Yes, ask: "What is your temperature, how was it measured, and when did it start?" ?    *No Answer* ?7. CARDIAC HISTORY: "Do you have any history of heart disease?" (e.g., heart attack, congestive heart failure)  ?    *No Answer* ?8. LUNG HISTORY: "Do you have any history of lung disease?"  (e.g., pulmonary embolus, asthma, emphysema) ?    *No Answer* ?9. PE RISK FACTORS: "Do you have a history of blood clots?" (or: recent major surgery, recent prolonged travel, bedridden) ?    *No Answer* ?10. OTHER SYMPTOMS: "Do you have any other symptoms?" (e.g., runny nose, wheezing, chest pain) ?      *No Answer* ?11. PREGNANCY: "Is there any chance you are pregnant?" "When was your last menstrual period?" ?      *No Answer* ?12. TRAVEL: "Have you traveled out of the country in the last month?" (e.g., travel history, exposures) ?      *No Answer* ? ?Protocols used: Cough - Acute Productive-A-AH ? ?

## 2022-02-23 ENCOUNTER — Ambulatory Visit (INDEPENDENT_AMBULATORY_CARE_PROVIDER_SITE_OTHER): Payer: Medicare Other | Admitting: Unknown Physician Specialty

## 2022-02-23 ENCOUNTER — Encounter: Payer: Self-pay | Admitting: Unknown Physician Specialty

## 2022-02-23 VITALS — BP 100/69 | HR 66 | Temp 98.9°F | Wt 210.4 lb

## 2022-02-23 DIAGNOSIS — R509 Fever, unspecified: Secondary | ICD-10-CM | POA: Diagnosis not present

## 2022-02-23 DIAGNOSIS — J309 Allergic rhinitis, unspecified: Secondary | ICD-10-CM | POA: Diagnosis not present

## 2022-02-23 DIAGNOSIS — D8940 Mast cell activation, unspecified: Secondary | ICD-10-CM | POA: Diagnosis not present

## 2022-02-23 DIAGNOSIS — R051 Acute cough: Secondary | ICD-10-CM

## 2022-02-23 LAB — PREGNANCY, URINE: Preg Test, Ur: NEGATIVE

## 2022-02-23 MED ORDER — MONTELUKAST SODIUM 10 MG PO TABS: 10.0000 mg | ORAL_TABLET | Freq: Every day | ORAL | 3 refills | Status: DC

## 2022-02-23 NOTE — Assessment & Plan Note (Addendum)
Pt with a significant history of MCAS.  Will refer to allergist.  Rx for Singulair and Cetirizine can take up to twice a day.  OTC supplements of Quercetin/Bromelain/Stinging nettle ?

## 2022-02-23 NOTE — Progress Notes (Signed)
? ?BP 100/69   Pulse 66   Temp 98.9 ?F (37.2 ?C) (Oral)   Wt 210 lb 6.4 oz (95.4 kg)   SpO2 98%   BMI 31.07 kg/m?   ? ?Subjective:  ? ? Patient ID: Stacy West, female    DOB: 1981/06/24, 42 y.o.   MRN: [id number] ? ?HPI: ?Stacy West is a 41 y.o. female ? ?Chief Complaint  ?Patient presents with  ? Allergies  ?  C/o pressure behind the eyes, coughing, sneezing, runny nose, wheezing. Onset 3 weeks ago. OTC meds are not helping per pt.   ? ?Pt with a history of MCAS, POTS, here for significant problems with pressure, coughing, sneezing, runny nose, wheezing.  She had a significant history of antibiotics in the past but not for the last 4-5 years.  Pt states she is coughing clear but foamy phlegm.  She has tired Clariton, Flonase, Allegra, and Benadryl.  Sxs ongoing for 3 weeks.   ? ? ?Relevant past medical, surgical, family and social history reviewed and updated as indicated. Interim medical history since our last visit reviewed. ?Allergies and medications reviewed and updated. ? ?Review of Systems  ?Constitutional:  Positive for fatigue.  ?HENT:  Positive for rhinorrhea, sinus pressure, sinus pain and sneezing.   ?Respiratory:  Positive for cough and wheezing.   ? ?Per HPI unless specifically indicated above ? ?   ?Objective:  ?  ?BP 100/69   Pulse 66   Temp 98.9 ?F (37.2 ?C) (Oral)   Wt 210 lb 6.4 oz (95.4 kg)   SpO2 98%   BMI 31.07 kg/m?   ?Wt Readings from Last 3 Encounters:  ?02/23/22 210 lb 6.4 oz (95.4 kg)  ?01/06/22 203 lb (92.1 kg)  ?12/25/21 203 lb 6.4 oz (92.3 kg)  ?  ?Physical Exam ?Constitutional:   ?   General: She is not in acute distress. ?   Appearance: Normal appearance. She is well-developed.  ?HENT:  ?   Head: Normocephalic and atraumatic.  ?Eyes:  ?   General: Lids are normal. No scleral icterus.    ?   Right eye: No discharge.     ?   Left eye: No discharge.  ?   Conjunctiva/sclera: Conjunctivae normal.  ?Neck:  ?   Vascular: No carotid bruit or JVD.  ?Cardiovascular:  ?   Rate and  Rhythm: Normal rate and regular rhythm.  ?   Heart sounds: Normal heart sounds.  ?Pulmonary:  ?   Effort: Pulmonary effort is normal. No respiratory distress.  ?   Breath sounds: Normal breath sounds.  ?Abdominal:  ?   Palpations: There is no hepatomegaly or splenomegaly.  ?Musculoskeletal:     ?   General: Normal range of motion.  ?   Cervical back: Normal range of motion and neck supple.  ?Skin: ?   General: Skin is warm and dry.  ?   Coloration: Skin is not pale.  ?   Findings: No rash.  ?Neurological:  ?   Mental Status: She is alert and oriented to person, place, and time.  ?Psychiatric:     ?   Behavior: Behavior normal.     ?   Thought Content: Thought content normal.     ?   Judgment: Judgment normal.  ? ? ?Results for orders placed or performed in visit on 02/23/22  ?Pregnancy, urine  ?Result Value Ref Range  ? Preg Test, Ur Negative Negative  ? ?   ?Assessment & Plan:  ? ?Problem List  Items Addressed This Visit   ? ?  ? Unprioritized  ? 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[place])  ?  Pt with a significant history of MCAS.  Will refer to allergist.  Rx for Singulair and Cetirizine can take up to twice a day.  OTC supplements of Quercetin/Bromelain/Stinging nettle ?  ?  ? Relevant Orders  ? Ambulatory referral to Allergy  ? ?Other Visit Diagnoses   ? ? Allergic rhinitis, unspecified seasonality, unspecified trigger    -  Primary  ? Relevant Orders  ? Ambulatory referral to Allergy  ? Acute cough      ? Pt concerned about her low grade fever and bronchitis.  Will get chest x-ray.  Pregnancy test is negative  ? Relevant Orders  ? DG Chest 2 View  ? Pregnancy, urine (Completed)  ? hCG, serum, qualitative  ? Low grade fever      ? Higher than her normal.  Can be related to allergies.  Will get chest x-ray  ? Relevant Orders  ? DG Chest 2 View  ? ?  ?  ? ?Follow up plan: ?Return if symptoms worsen or fail to improve. ? ? ? ? ? ?

## 2022-02-23 NOTE — Patient Instructions (Signed)
Take Ceterizine (Zyrtec 10 mg) up to twice a day ? ?Prescription for Singulair 10 mg ? ?For MCAS: Quercetin/Bromeline/stinging nettles ?

## 2022-02-24 LAB — HCG, SERUM, QUALITATIVE: hCG,Beta Subunit,Qual,Serum: NEGATIVE m[IU]/mL (ref <6)

## 2022-02-25 ENCOUNTER — Ambulatory Visit: Payer: Self-pay | Admitting: *Deleted

## 2022-02-25 DIAGNOSIS — Z1329 Encounter for screening for other suspected endocrine disorder: Secondary | ICD-10-CM | POA: Diagnosis not present

## 2022-02-25 DIAGNOSIS — R6889 Other general symptoms and signs: Secondary | ICD-10-CM | POA: Diagnosis not present

## 2022-02-25 DIAGNOSIS — R5382 Chronic fatigue, unspecified: Secondary | ICD-10-CM | POA: Diagnosis not present

## 2022-02-25 NOTE — Telephone Encounter (Signed)
Pt states she has been taking the allergy med prescribed 2 days and she is still congested, eye and ear pressure, sinus pressure. Pt has been this way 3 weeks.  Please advise ? ?Attempted to reach pt. Left VM to call back.  ?

## 2022-02-25 NOTE — Telephone Encounter (Signed)
Routing to PCP to advise. Patient requesting new or stronger allergy medication.  ? ?I also do not see a Pallative referral in the chart.  ?

## 2022-02-25 NOTE — Telephone Encounter (Signed)
Patient also states she has not heard from Palliative care referral and request follow up with that. ?

## 2022-02-25 NOTE — Telephone Encounter (Signed)
2nd attempt to reach pt, left VM to call back. 

## 2022-02-25 NOTE — Telephone Encounter (Signed)
?  Chief Complaint: extreme sinus allergy reaction- singular has not helped much- 10% better ?Symptoms: sinus pressure- face/head, congestion, cough, wheezing ?Frequency: 3 weeks ?Pertinent Negatives: Patient denies fever ?Disposition: '[]'$ ED /'[]'$ Urgent Care (no appt availability in office) / '[]'$ Appointment(In office/virtual)/ '[]'$   Virtual Care/ '[]'$ Home Care/ '[]'$ Refused Recommended Disposition /'[]'$  Mobile Bus/ '[x]'$  Follow-up with PCP ?Additional Notes: Patient was seen in office 4/11- calling to report symptoms are not improved with Singular- is there increased dosing or something else that would be helpful.  ? ?Reason for Disposition ? [1] Redness or swelling on the cheek, forehead or around the eye AND [2] no fever ? ?Answer Assessment - Initial Assessment Questions ?1. LOCATION: "Where does it hurt?"  ?    Head/sinus, ear-pressure ?2. ONSET: "When did the sinus pain start?"  (e.g., hours, days)  ?    3 weeks ?3. SEVERITY: "How bad is the pain?"   (Scale 1-10; mild, moderate or severe) ?  - MILD (1-3): doesn't interfere with normal activities  ?  - MODERATE (4-7): interferes with normal activities (e.g., work or school) or awakens from sleep ?  - SEVERE (8-10): excruciating pain and patient unable to do any normal activities    ?    moderate ?4. RECURRENT SYMPTOM: "Have you ever had sinus problems before?" If Yes, ask: "When was the last time?" and "What happened that time?"  ?    Yes- not this extreme- recent move to [place]- allergy flare ?5. NASAL CONGESTION: "Is the nose blocked?" If Yes, ask: "Can you open it or must you breathe through your mouth?" ?    Yes-clear,bubble nasal discharge  ?6. NASAL DISCHARGE: "Do you have discharge from your nose?" If so ask, "What color?" ?    Yes- clear ?7. FEVER: "Do you have a fever?" If Yes, ask: "What is it, how was it measured, and when did it start?"  ?    no ?8. OTHER SYMPTOMS: "Do you have any other symptoms?" (e.g., sore throat, cough, earache, difficulty  breathing) ?    Body pain, ear pressure, sore throat chest congestion, wheezing ?9. PREGNANCY: "Is there any chance you are pregnant?" "When was your last menstrual period?" ?     no ? ?Protocols used: Sinus Pain or Congestion-A-AH ? ?

## 2022-03-01 ENCOUNTER — Ambulatory Visit: Payer: Self-pay

## 2022-03-01 NOTE — Telephone Encounter (Signed)
?  Chief Complaint: Wheezing - cough. Low grade fever ?Symptoms: Cough sinus drainage ?Frequency: 2-3 weeks ?Pertinent Negatives: Patient denies SOB,  ?Disposition: '[]'$ ED /'[]'$ Urgent Care (no appt availability in office) / '[x]'$ Appointment(In office/virtual)/ '[]'$  [place] [facility]/ '[]'$ Home Care/ '[]'$ Refused Recommended Disposition /'[]'$ [place] [facility]/ '[]'$  Follow-up with PCP ?Additional Notes: Pt has been seen for this already and is not seeing any improvement.  She is wheezing a bit and has SOB when she lays down at night.Pt states that mucous is now green.  ? ? ?Reason for Disposition ? [1] MILD longstanding difficulty breathing AND [2]  SAME as normal ? ?Answer Assessment - Initial Assessment Questions ?1. RESPIRATORY STATUS: "Describe your breathing?" (e.g., wheezing, shortness of breath, unable to speak, severe coughing)  ?    Wheezing and SOB when lying down ?2. ONSET: "When did this breathing problem begin?"  ?    ongoing ?3. PATTERN "Does the difficult breathing come and go, or has it been constant since it started?"  ?    constant ?4. SEVERITY: "How bad is your breathing?" (e.g., mild, moderate, severe)  ?  - MILD: No SOB at rest, mild SOB with walking, speaks normally in sentences, can lie down, no retractions, pulse < 100.  ?  - MODERATE: SOB at rest, SOB with minimal exertion and prefers to sit, cannot lie down flat, speaks in phrases, mild retractions, audible wheezing, pulse 100-120.  ?  - SEVERE: Very SOB at rest, speaks in single words, struggling to breathe, sitting hunched forward, retractions, pulse > 120  ?    mild ?5. RECURRENT SYMPTOM: "Have you had difficulty breathing before?" If Yes, ask: "When was the last time?" and "What happened that time?"  ?    Has been ongoing for over 2 weeks ?6. CARDIAC HISTORY: "Do you have any history of heart disease?" (e.g., heart attack, angina, bypass surgery, angioplasty)  ?    na ?7. LUNG HISTORY: "Do you have any history of lung disease?"  (e.g., pulmonary  embolus, asthma, emphysema) ?    na ?8. CAUSE: "What do you think is causing the breathing problem?"  ?    Mast cell activation - Allergies ?9. OTHER SYMPTOMS: "Do you have any other symptoms? (e.g., dizziness, runny nose, cough, chest pain, fever) ?    Cough, wheezing at night ?10. O2 SATURATION MONITOR:  "Do you use an oxygen saturation monitor (pulse oximeter) at home?" If Yes, "What is your reading (oxygen level) today?" "What is your usual oxygen saturation reading?" (e.g., 95%) ?       ?11. PREGNANCY: "Is there any chance you are pregnant?" "When was your last menstrual period?" ?       ?12. TRAVEL: "Have you traveled out of the country in the last month?" (e.g., travel history, exposures) ? ?Protocols used: Breathing Difficulty-A-AH ? ?

## 2022-03-01 NOTE — Telephone Encounter (Signed)
Called and left a detailed VM with [name]'s message. Asked for patient to please call back with any questions or concerns.  ?

## 2022-03-02 ENCOUNTER — Ambulatory Visit
Admission: RE | Admit: 2022-03-02 | Discharge: 2022-03-02 | Disposition: A | Discharge status: Home / Self Care | Payer: Medicare Other | Source: Ambulatory Visit | Attending: Unknown Physician Specialty | Admitting: Unknown Physician Specialty

## 2022-03-02 ENCOUNTER — Encounter: Payer: Self-pay | Admitting: Nurse Practitioner

## 2022-03-02 ENCOUNTER — Encounter: Payer: Self-pay | Admitting: Family Medicine

## 2022-03-02 ENCOUNTER — Ambulatory Visit
Admission: RE | Admit: 2022-03-02 | Discharge: 2022-03-02 | Disposition: A | Discharge status: Home / Self Care | Payer: Medicare Other | Attending: Unknown Physician Specialty | Admitting: Unknown Physician Specialty

## 2022-03-02 ENCOUNTER — Ambulatory Visit (INDEPENDENT_AMBULATORY_CARE_PROVIDER_SITE_OTHER): Payer: Medicare Other | Admitting: Family Medicine

## 2022-03-02 VITALS — BP 102/54 | HR 68 | Temp 98.5°F | Wt 211.8 lb

## 2022-03-02 DIAGNOSIS — D8940 Mast cell activation, unspecified: Secondary | ICD-10-CM | POA: Diagnosis not present

## 2022-03-02 DIAGNOSIS — R059 Cough, unspecified: Secondary | ICD-10-CM | POA: Diagnosis not present

## 2022-03-02 DIAGNOSIS — R52 Pain, unspecified: Secondary | ICD-10-CM

## 2022-03-02 DIAGNOSIS — J309 Allergic rhinitis, unspecified: Secondary | ICD-10-CM | POA: Diagnosis not present

## 2022-03-02 DIAGNOSIS — R509 Fever, unspecified: Secondary | ICD-10-CM

## 2022-03-02 DIAGNOSIS — J0110 Acute frontal sinusitis, unspecified: Secondary | ICD-10-CM

## 2022-03-02 DIAGNOSIS — R051 Acute cough: Secondary | ICD-10-CM | POA: Insufficient documentation

## 2022-03-02 MED ORDER — FAMOTIDINE 20 MG PO TABS: 20.0000 mg | ORAL_TABLET | Freq: Two times a day (BID) | ORAL | 3 refills | Status: DC

## 2022-03-02 MED ORDER — KETOROLAC TROMETHAMINE 60 MG/2ML IM SOLN
60.0000 mg | Freq: Once | INTRAMUSCULAR | Status: AC
  Administered 2022-03-02: 60 mg via INTRAMUSCULAR

## 2022-03-02 MED ORDER — LEVOCETIRIZINE DIHYDROCHLORIDE 5 MG PO TABS: 5.0000 mg | ORAL_TABLET | Freq: Every evening | ORAL | 1 refills | Status: DC

## 2022-03-02 MED ORDER — AMOXICILLIN-POT CLAVULANATE 875-125 MG PO TABS: 1.0000 | ORAL_TABLET | Freq: Two times a day (BID) | ORAL | 0 refills | Status: DC

## 2022-03-02 NOTE — Progress Notes (Signed)
? ?BP (!) 102/54   Pulse 68   Temp 98.5 ?F (36.9 ?C)   Wt 211 lb 12.8 oz (96.1 kg)   SpO2 98%   BMI 31.28 kg/m?   ? ?Subjective:  ? ? Patient ID: Stacy West, female    DOB: 11/03/1981, 41 y.o.   MRN: [id number] ? ?HPI: ?Stacy West is a 41 y.o. female ? ?Chief Complaint  ?Patient presents with  ? Cough  ?  Patient states she is coughing up yellow mucus. Patient has been coughing for about 3-4 weeks. Patient states she is wheezing at night and having facial pressure and headaches. Patient was seen a week ago and has not went to get chest xray yet.   ? ?Has been having issues with allergies for about the past 3-4 weeks ?UPPER RESPIRATORY TRACT INFECTION ?Duration: 3-4 weeks ?Worst symptom: congestion. Pain in her face ?Fever: yes ?Cough: yes ?Shortness of breath: yes ?Wheezing: yes ?Chest pain: no ?Chest tightness: yes ?Chest congestion: yes ?Nasal congestion: yes ?Runny nose: no ?Post nasal drip: no ?Sneezing: no ?Sore throat: yes ?Swollen glands: yes ?Sinus pressure: yes ?Headache: yes ?Face pain: yes ?Toothache: no ?Ear pain: yes bilateral ?Ear pressure: yes bilateral ?Eyes red/itching:no ?Eye drainage/crusting: no  ?Vomiting: no ?Rash: no ?Fatigue: yes ?Sick contacts: no ?Strep contacts: no  ?Context: worse ?Recurrent sinusitis: no ?Relief with OTC cold/cough medications: no  ?Treatments attempted: cold/sinus, mucinex, anti-histamine, and pseudoephedrine  ? ?Relevant past medical, surgical, family and social history reviewed and updated as indicated. Interim medical history since our last visit reviewed. ?Allergies and medications reviewed and updated. ? ?Review of Systems  ?Constitutional:  Positive for fatigue. Negative for activity change, appetite change, chills, diaphoresis, fever and unexpected weight change.  ?HENT:  Positive for congestion, postnasal drip, sinus pressure, sinus pain and sore throat. Negative for dental problem, drooling, ear discharge, ear pain, facial swelling, hearing loss, mouth  sores, nosebleeds, rhinorrhea, sneezing, tinnitus, trouble swallowing and voice change.   ?Respiratory:  Positive for cough, shortness of breath and wheezing. Negative for apnea, choking, chest tightness and stridor.   ?Cardiovascular: Negative.   ?Gastrointestinal: Negative.   ?Musculoskeletal: Negative.   ?Psychiatric/Behavioral: Negative.    ? ?Per HPI unless specifically indicated above ? ?   ?Objective:  ?  ?BP (!) 102/54   Pulse 68   Temp 98.5 ?F (36.9 ?C)   Wt 211 lb 12.8 oz (96.1 kg)   SpO2 98%   BMI 31.28 kg/m?   ?Wt Readings from Last 3 Encounters:  ?03/02/22 211 lb 12.8 oz (96.1 kg)  ?02/23/22 210 lb 6.4 oz (95.4 kg)  ?01/06/22 203 lb (92.1 kg)  ?  ?Physical Exam ?Vitals and nursing note reviewed.  ?Constitutional:   ?   General: She is not in acute distress. ?   Appearance: Normal appearance. She is ill-appearing. She is not toxic-appearing or diaphoretic.  ?HENT:  ?   Head: Normocephalic and atraumatic.  ?   Right Ear: Tympanic membrane, ear canal and external ear normal.  ?   Left Ear: Tympanic membrane, ear canal and external ear normal.  ?   Nose: Rhinorrhea present. No congestion.  ?   Mouth/Throat:  ?   Mouth: Mucous membranes are moist.  ?   Pharynx: Oropharynx is clear. No oropharyngeal exudate or posterior oropharyngeal erythema.  ?Eyes:  ?   General: No scleral icterus.    ?   Right eye: No discharge.     ?   Left eye: No discharge.  ?  Extraocular Movements: Extraocular movements intact.  ?   Conjunctiva/sclera: Conjunctivae normal.  ?   Pupils: Pupils are equal, round, and reactive to light.  ?Cardiovascular:  ?   Rate and Rhythm: Normal rate and regular rhythm.  ?   Pulses: Normal pulses.  ?   Heart sounds: Normal heart sounds. No murmur heard. ?  No friction rub. No gallop.  ?Pulmonary:  ?   Effort: Pulmonary effort is normal. No respiratory distress.  ?   Breath sounds: Normal breath sounds. No stridor. No wheezing, rhonchi or rales.  ?Chest:  ?   Chest wall: No tenderness.   ?Musculoskeletal:     ?   General: Normal range of motion.  ?   Cervical back: Normal range of motion and neck supple.  ?Skin: ?   General: Skin is warm and dry.  ?   Capillary Refill: Capillary refill takes less than 2 seconds.  ?   Coloration: Skin is not jaundiced or pale.  ?   Findings: No bruising, erythema, lesion or rash.  ?Neurological:  ?   General: No focal deficit present.  ?   Mental Status: She is alert and oriented to person, place, and time. Mental status is at baseline.  ?Psychiatric:     ?   Mood and Affect: Mood normal.     ?   Behavior: Behavior normal.     ?   Thought Content: Thought content normal.     ?   Judgment: Judgment normal.  ? ? ?Results for orders placed or performed in visit on 02/23/22  ?Pregnancy, urine  ?Result Value Ref Range  ? Preg Test, Ur Negative Negative  ?hCG, serum, qualitative  ?Result Value Ref Range  ? hCG,Beta Subunit,Qual,Serum Negative Negative <6 mIU/mL  ? ?   ?Assessment & Plan:  ? ?Problem List Items Addressed This Visit   ? ?  ? Other  ? 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[place])  ?  Will see about getting allergy referral changed to urgent. Continue xyzal and pepcid and singulair. Call with any concerns.  ? ?  ?  ? ?Other Visit Diagnoses   ? ? Acute non-recurrent frontal sinusitis    -  Primary  ? Will treat with augmentin. Call with any concerns. Continue to monitor.   ? Relevant Medications  ? levocetirizine (XYZAL) 5 MG tablet  ? amoxicillin-clavulanate (AUGMENTIN) 875-125 MG tablet  ? Allergic rhinitis, unspecified seasonality, unspecified trigger      ? Will see about getting allergy referral changed to urgent. Continue xyzal and pepcid and singulair. Call with any concerns.   ? Body aches      ? Toradol shot given today.   ? Relevant Medications  ? ketorolac (TORADOL) injection 60 mg (Start on 03/02/2022  9:15 AM)  ? ?  ?  ? ?Follow up plan: ?Return if symptoms worsen or fail to improve. ? ? ? ? ? ?

## 2022-03-02 NOTE — Assessment & Plan Note (Signed)
Will see about getting allergy referral changed to urgent. Continue xyzal and pepcid and singulair. Call with any concerns.  ?

## 2022-03-03 ENCOUNTER — Encounter: Payer: Self-pay | Admitting: Nurse Practitioner

## 2022-03-03 NOTE — Progress Notes (Signed)
Contacted via [place] ? ? ?Good evening Stacy West, your chest imaging returned without any acute findings on review of report and of images.  Any questions?

## 2022-03-04 ENCOUNTER — Ambulatory Visit: Payer: Medicare Other | Admitting: Nurse Practitioner

## 2022-03-10 DIAGNOSIS — I341 Nonrheumatic mitral (valve) prolapse: Secondary | ICD-10-CM | POA: Diagnosis not present

## 2022-03-10 DIAGNOSIS — G8929 Other chronic pain: Secondary | ICD-10-CM | POA: Diagnosis not present

## 2022-03-10 DIAGNOSIS — M2480 Other specific joint derangements of unspecified joint, not elsewhere classified: Secondary | ICD-10-CM | POA: Diagnosis not present

## 2022-03-12 DIAGNOSIS — R519 Headache, unspecified: Secondary | ICD-10-CM | POA: Diagnosis not present

## 2022-03-12 DIAGNOSIS — M9901 Segmental and somatic dysfunction of cervical region: Secondary | ICD-10-CM | POA: Diagnosis not present

## 2022-03-12 DIAGNOSIS — M5412 Radiculopathy, cervical region: Secondary | ICD-10-CM | POA: Diagnosis not present

## 2022-03-12 DIAGNOSIS — M9902 Segmental and somatic dysfunction of thoracic region: Secondary | ICD-10-CM | POA: Diagnosis not present

## 2022-03-16 ENCOUNTER — Encounter: Payer: Self-pay | Admitting: Nurse Practitioner

## 2022-03-16 DIAGNOSIS — Q7962 Hypermobile Ehlers-Danlos syndrome: Secondary | ICD-10-CM

## 2022-03-16 DIAGNOSIS — R519 Headache, unspecified: Secondary | ICD-10-CM | POA: Diagnosis not present

## 2022-03-16 DIAGNOSIS — M9901 Segmental and somatic dysfunction of cervical region: Secondary | ICD-10-CM | POA: Diagnosis not present

## 2022-03-16 DIAGNOSIS — M9902 Segmental and somatic dysfunction of thoracic region: Secondary | ICD-10-CM | POA: Diagnosis not present

## 2022-03-16 DIAGNOSIS — M5412 Radiculopathy, cervical region: Secondary | ICD-10-CM | POA: Diagnosis not present

## 2022-03-17 NOTE — Addendum Note (Signed)
Addended by: Marnee Guarneri T on: 03/17/2022 12:35 PM ? ? Modules accepted: Orders ? ?

## 2022-03-17 NOTE — Progress Notes (Signed)
Error

## 2022-03-26 DIAGNOSIS — R519 Headache, unspecified: Secondary | ICD-10-CM | POA: Diagnosis not present

## 2022-03-26 DIAGNOSIS — M5412 Radiculopathy, cervical region: Secondary | ICD-10-CM | POA: Diagnosis not present

## 2022-03-26 DIAGNOSIS — M9901 Segmental and somatic dysfunction of cervical region: Secondary | ICD-10-CM | POA: Diagnosis not present

## 2022-03-26 DIAGNOSIS — M9902 Segmental and somatic dysfunction of thoracic region: Secondary | ICD-10-CM | POA: Diagnosis not present

## 2022-04-01 ENCOUNTER — Encounter: Payer: Self-pay | Admitting: Nurse Practitioner

## 2022-04-22 DIAGNOSIS — M5412 Radiculopathy, cervical region: Secondary | ICD-10-CM | POA: Diagnosis not present

## 2022-04-22 DIAGNOSIS — M9901 Segmental and somatic dysfunction of cervical region: Secondary | ICD-10-CM | POA: Diagnosis not present

## 2022-04-22 DIAGNOSIS — M9902 Segmental and somatic dysfunction of thoracic region: Secondary | ICD-10-CM | POA: Diagnosis not present

## 2022-04-22 DIAGNOSIS — R519 Headache, unspecified: Secondary | ICD-10-CM | POA: Diagnosis not present

## 2022-04-23 DIAGNOSIS — Q7962 Hypermobile Ehlers-Danlos syndrome: Secondary | ICD-10-CM | POA: Diagnosis not present

## 2022-04-25 NOTE — Progress Notes (Unsigned)
New Patient Note  RE: Stacy West MRN: [id number] DOB: 1981-08-09 Date of Office Visit: 04/26/2022  Consult requested by: Kathrine Haddock, NP Primary care provider: Venita Lick, NP  Chief Complaint: No chief complaint on file.  History of Present Illness: I had the pleasure of seeing Stacy West for initial evaluation at the [facility] on 04/25/2022. She is a 41 y.o. female, who is referred here by Venita Lick, NP for the evaluation of environmental allergies.  She reports symptoms of ***. Symptoms have been going on for *** years. The symptoms are present *** all year around with worsening in ***. Other triggers include exposure to ***. Anosmia: ***. Headache: ***. She has used *** with ***fair improvement in symptoms. Sinus infections: ***. Previous work up includes: ***. Previous ENT evaluation: ***. Previous sinus imaging: ***. History of nasal polyps: ***. Last eye exam: ***. History of reflux: ***.  02/23/2022 PCP visit: "Pt with a history of MCAS, POTS, here for significant problems with pressure, coughing, sneezing, runny nose, wheezing.  She had a significant history of antibiotics in the past but not for the last 4-5 years.  Pt states she is coughing clear but foamy phlegm.  She has tired Clariton, Flonase, Allegra, and Benadryl.  Sxs ongoing for 3 weeks."  Assessment and Plan: Stacy West is a 41 y.o. female with: No problem-specific Assessment & Plan notes found for this encounter.  No follow-ups on file.  No orders of the defined types were placed in this encounter.  Lab Orders  No laboratory test(s) ordered today    Other allergy screening: Asthma: {Blank single:19197::"yes","no"} Rhino conjunctivitis: {Blank single:19197::"yes","no"} Food allergy: {Blank single:19197::"yes","no"} Medication allergy: {Blank single:19197::"yes","no"} Hymenoptera allergy: {Blank single:19197::"yes","no"} Urticaria: {Blank  single:19197::"yes","no"} Eczema:{Blank single:19197::"yes","no"} History of recurrent infections suggestive of immunodeficency: {Blank single:19197::"yes","no"}  Diagnostics: Spirometry:  Tracings reviewed. Her effort: {Blank single:19197::"Good reproducible efforts.","It was hard to get consistent efforts and there is a question as to whether this reflects a maximal maneuver.","Poor effort, data can not be interpreted."} FVC: ***L FEV1: ***L, ***% predicted FEV1/FVC ratio: ***% Interpretation: {Blank single:19197::"Spirometry consistent with mild obstructive disease","Spirometry consistent with moderate obstructive disease","Spirometry consistent with severe obstructive disease","Spirometry consistent with possible restrictive disease","Spirometry consistent with mixed obstructive and restrictive disease","Spirometry uninterpretable due to technique","Spirometry consistent with normal pattern","No overt abnormalities noted given today's efforts"}.  Please see scanned spirometry results for details.  Skin Testing: {Blank single:19197::"Select foods","Environmental allergy panel","Environmental allergy panel and select foods","Food allergy panel","None","Deferred due to recent antihistamines use"}. *** Results discussed with patient/family.   Past Medical History: Patient Active Problem List   Diagnosis Date Noted   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[place]) 02/23/2022   Urinary symptom or sign 01/06/2022   Chronic neck pain 12/20/2021   Chronic pain syndrome 12/20/2021   Fibromyalgia 12/20/2021   Sinus tachycardia 08/17/2021   Gastroesophageal reflux disease without esophagitis 05/07/2021   Moderate episode of recurrent major depressive disorder ([place]) 01/15/2021   Celiac disease 01/31/2020   Right ovarian cyst 08/15/2019   Cervical spondylosis without myelopathy 07/04/2019   MVP (mitral valve prolapse) 05/31/2019   Ehlers-Danlos syndrome type III 05/17/2019   PCOS (polycystic ovarian syndrome)  05/17/2019   Insulin resistance 10/21/2016   Chronic fatigue syndrome with fibromyalgia 01/31/2007   Degenerative disc disease at L5-S1 level 01/31/2007   Past Medical History:  Diagnosis Date   Abnormal Pap smear of cervix    Allergy    Depression    Ehlers-Danlos disease    Fibromyalgia  Heart murmur    Insulin resistance    Miscarriage    Other cervical disc degeneration at C4-C5 level    PCOS (polycystic ovarian syndrome)    PCOS (polycystic ovarian syndrome)    Premature ovarian failure    SI (sacroiliac) joint dysfunction    Sleep apnea    Thyroid disease    Past Surgical History: Past Surgical History:  Procedure Laterality Date   ADENOIDECTOMY     BACK SURGERY     COLPOSCOPY     FINGER SURGERY Left    NECK SURGERY  07/2020   TONSILLECTOMY     WISDOM TOOTH EXTRACTION     Medication List:  Current Outpatient Medications  Medication Sig Dispense Refill   amoxicillin-clavulanate (AUGMENTIN) 875-125 MG tablet Take 1 tablet by mouth 2 (two) times daily. 20 tablet 0   cyclobenzaprine (FLEXERIL) 10 MG tablet Take 1 tablet (10 mg total) by mouth 3 (three) times daily as needed for muscle spasms. 270 tablet 4   estradiol (ESTRACE) 1 MG tablet Take 1 tablet by mouth daily.     famotidine (PEPCID) 20 MG tablet Take 1 tablet (20 mg total) by mouth 2 (two) times daily. 60 tablet 3   glipiZIDE (GLUCOTROL) 5 MG tablet Take 1 tablet (5 mg total) by mouth daily before breakfast. 90 tablet 3   levocetirizine (XYZAL) 5 MG tablet Take 1 tablet (5 mg total) by mouth every evening. 90 tablet 1   medroxyPROGESTERone (PROVERA) 10 MG tablet TAKE 2 TABLETS (20 MG TOTAL) BY MOUTH DAILY. USE FOR 14 DAYS EACH MONTH 30 tablet 4   metoprolol tartrate (LOPRESSOR) 25 MG tablet Take 25 mg by mouth daily.     montelukast (SINGULAIR) 10 MG tablet Take 1 tablet (10 mg total) by mouth at bedtime. 30 tablet 3   No current facility-administered medications for this visit.   Allergies: Allergies   Allergen Reactions   Carisoprodol-Aspirin-Codeine Hives and Itching   Carisoprodol Dermatitis   Percocet  [Oxycodone-Acetaminophen] Dermatitis   Gluten Meal Hives and Nausea Only   Social History: Social History   Socioeconomic History   Marital status: Married    Spouse name: Not on file   Number of children: 1   Years of education: Not on file   Highest education level: Not on file  Occupational History   Not on file  Tobacco Use   Smoking status: Former    Types: Cigarettes    Quit date: 2018    Years since quitting: 5.4   Smokeless tobacco: Never  Vaping Use   Vaping Use: Never used  Substance and Sexual Activity   Alcohol use: Not Currently   Drug use: Not Currently   Sexual activity: Yes    Birth control/protection: None  Other Topics Concern   Not on file  Social History Narrative   Not on file   Social Determinants of Health   Financial Resource Strain: Low Risk  (02/03/2022)   Overall Financial Resource Strain (CARDIA)    Difficulty of Paying Living Expenses: Not very hard  Food Insecurity: No Food Insecurity (02/03/2022)   Hunger Vital Sign    Worried About Running Out of Food in the Last Year: Never true    Ran Out of Food in the Last Year: Never true  Transportation Needs: No Transportation Needs (02/03/2022)   PRAPARE - Hydrologist (Medical): No    Lack of Transportation (Non-Medical): No  Physical Activity: Insufficiently Active (02/03/2022)   Exercise Vital  Sign    Days of Exercise per Week: 3 days    Minutes of Exercise per Session: 30 min  Stress: Stress Concern Present (02/03/2022)   [place]    Feeling of Stress : Very much  Social Connections: Socially Isolated (02/03/2022)   Social Connection and Isolation Panel [NHANES]    Frequency of Communication with Friends and Family: Never    Frequency of Social Gatherings with Friends and Family: Never     Attends Religious Services: Never    Marine scientist or Organizations: No    Attends Archivist Meetings: Never    Marital Status: Married   Lives in a ***. Smoking: *** Occupation: ***  Environmental HistoryFreight forwarder in the house: Estate agent in the family room: {Blank single:19197::"yes","no"} Carpet in the bedroom: {Blank single:19197::"yes","no"} Heating: {Blank single:19197::"electric","gas","heat pump"} Cooling: {Blank single:19197::"central","window","heat pump"} Pet: {Blank single:19197::"yes ***","no"}  Family History: Family History  Problem Relation Age of Onset   Heart murmur Mother    Asthma Mother    Skin cancer Father    COPD Father    Heart murmur Maternal Aunt    Breast cancer Maternal Aunt    Bipolar disorder Brother    Depression Brother    Alcoholism Brother    Depression Brother    Early menopause Maternal Aunt    Problem                               Relation Asthma                                   *** Eczema                                *** Food allergy                          *** Allergic rhino conjunctivitis     ***  Review of Systems  Constitutional:  Negative for appetite change, chills, fever and unexpected weight change.  HENT:  Negative for congestion and rhinorrhea.   Eyes:  Negative for itching.  Respiratory:  Negative for cough, chest tightness, shortness of breath and wheezing.   Cardiovascular:  Negative for chest pain.  Gastrointestinal:  Negative for abdominal pain.  Genitourinary:  Negative for difficulty urinating.  Skin:  Negative for rash.  Neurological:  Negative for headaches.    Objective: There were no vitals taken for this visit. There is no height or weight on file to calculate BMI. Physical Exam Vitals and nursing note reviewed.  Constitutional:      Appearance: Normal appearance. She is well-developed.  HENT:     Head: Normocephalic and atraumatic.      Right Ear: Tympanic membrane and external ear normal.     Left Ear: Tympanic membrane and external ear normal.     Nose: Nose normal.     Mouth/Throat:     Mouth: Mucous membranes are moist.     Pharynx: Oropharynx is clear.  Eyes:     Conjunctiva/sclera: Conjunctivae normal.  Cardiovascular:     Rate and Rhythm: Normal rate and regular rhythm.     Heart sounds: Normal heart sounds. No murmur heard.  No friction rub. No gallop.  Pulmonary:     Effort: Pulmonary effort is normal.     Breath sounds: Normal breath sounds. No wheezing, rhonchi or rales.  Musculoskeletal:     Cervical back: Neck supple.  Skin:    General: Skin is warm.     Findings: No rash.  Neurological:     Mental Status: She is alert and oriented to person, place, and time.  Psychiatric:        Behavior: Behavior normal.    The plan was reviewed with the patient/family, and all questions/concerned were addressed.  It was my pleasure to see Stacy West today and participate in her care. Please feel free to contact me with any questions or concerns.  Sincerely,  Rexene Alberts, DO Allergy & Immunology  [facility] office: [place] office: [phone]

## 2022-04-26 ENCOUNTER — Ambulatory Visit (INDEPENDENT_AMBULATORY_CARE_PROVIDER_SITE_OTHER): Payer: Medicare Other | Admitting: Allergy

## 2022-04-26 ENCOUNTER — Encounter: Payer: Self-pay | Admitting: Allergy

## 2022-04-26 VITALS — BP 114/58 | HR 85 | Temp 98.4°F | Resp 16 | Ht 68.5 in | Wt 219.4 lb

## 2022-04-26 DIAGNOSIS — T781XXD Other adverse food reactions, not elsewhere classified, subsequent encounter: Secondary | ICD-10-CM | POA: Insufficient documentation

## 2022-04-26 DIAGNOSIS — J3089 Other allergic rhinitis: Secondary | ICD-10-CM | POA: Diagnosis not present

## 2022-04-26 DIAGNOSIS — K219 Gastro-esophageal reflux disease without esophagitis: Secondary | ICD-10-CM

## 2022-04-26 DIAGNOSIS — R053 Chronic cough: Secondary | ICD-10-CM | POA: Diagnosis not present

## 2022-04-26 NOTE — Assessment & Plan Note (Addendum)
Evaluated by GI in the past and had EGD as well. Currently on famotidine '20mg'$  daily with no benefit. Not on PPI as trying to get pregnant.  See handout for lifestyle and dietary modifications.  Increase famotidine '20mg'$  to twice a day.

## 2022-04-26 NOTE — Patient Instructions (Addendum)
Rhinitis  Use over the counter antihistamines such as Zyrtec (cetirizine), Claritin (loratadine), Allegra (fexofenadine), or Xyzal (levocetirizine) daily as needed. May take twice a day during allergy flares. May switch antihistamines every few months. Continue Singulair (montelukast) '10mg'$  daily at night. Nasal saline spray (i.e., Simply Saline) or nasal saline lavage (i.e., NeilMed) is recommended as needed and prior to medicated nasal sprays. Get bloodwork.   Food:  Discussed with patient that skin prick testing and bloodwork (food IgE levels) check for IgE mediated reactions which her clinical presentation does not support.  Avoid gluten if it causes symptoms.   You may have some lactose intolerance May use lactose free milk or take a lactaid pill right before consuming anything with dairy.  Coughing/wheezing Normal breathing test today. Monitor symptoms.   Heartburn: See handout for lifestyle and dietary modifications. Increase famotidine '20mg'$  twice a day.   Get bloodwork to check tryptase level, environmental allergies and select foods.  We are ordering labs, so please allow 1-2 weeks for the results to come back. With the newly implemented Cures Act, the labs might be visible to you at the same time that they become visible to me. However, I will not address the results until all of the results are back, so please be patient.  In the meantime, continue recommendations in your patient instructions, including avoidance measures (if applicable), until you hear from me.  Follow up in 3 months or sooner if needed.

## 2022-04-26 NOTE — Assessment & Plan Note (Signed)
Coughing and wheezing at times. She also has uncontrolled GERD. Denies prior asthma diagnosis or inhaler use.  Today's spirometry was normal. . Monitor symptoms.

## 2022-04-26 NOTE — Assessment & Plan Note (Signed)
Avoiding gluten due to abdominal pain, weight gain and acne; ice cream causes diarrhea/abdominal pain. Avoiding eggs and soy due to PCOS. Concerned about food allergies.  Discussed with patient that skin prick testing and bloodwork (food IgE levels) check for IgE mediated reactions which her clinical presentation does not support.   Avoid gluten if it causes symptoms.   Will get basic food panel to rule out allergies.   She may have lactose intolerance - use lactose free milk or take a lactaid pill right before consuming anything with dairy.

## 2022-04-26 NOTE — Assessment & Plan Note (Signed)
Perennial rhinitis symptoms from spring through fall.  Tried Xyzal, Singulair, Claritin, Allegra, blender and Flonase with minimal benefit.  No prior allergy evaluation.  Had T&A at age 41.  Unable to skin test today due to recent antihistamine intake.  Get bloodwork and will make additional recommendations based on results.  Use over the counter antihistamines such as Zyrtec (cetirizine), Claritin (loratadine), Allegra (fexofenadine), or Xyzal (levocetirizine) daily as needed. May take twice a day during allergy flares. May switch antihistamines every few months.  Continue Singulair (montelukast) '10mg'$  daily at night.  Nasal saline spray (i.e., Simply Saline) or nasal saline lavage (i.e., NeilMed) is recommended as needed and prior to medicated nasal sprays.

## 2022-04-29 DIAGNOSIS — M5412 Radiculopathy, cervical region: Secondary | ICD-10-CM | POA: Diagnosis not present

## 2022-04-29 DIAGNOSIS — M9901 Segmental and somatic dysfunction of cervical region: Secondary | ICD-10-CM | POA: Diagnosis not present

## 2022-04-29 DIAGNOSIS — M9902 Segmental and somatic dysfunction of thoracic region: Secondary | ICD-10-CM | POA: Diagnosis not present

## 2022-04-29 DIAGNOSIS — R519 Headache, unspecified: Secondary | ICD-10-CM | POA: Diagnosis not present

## 2022-04-29 LAB — ALLERGENS W/TOTAL IGE AREA 2

## 2022-04-29 LAB — FOOD ALLERGY PROFILE
Allergen Corn, IgE: <0.1 kU/L
Clam IgE: <0.1 kU/L
Codfish IgE: <0.1 kU/L
Egg White IgE: <0.1 kU/L
Milk IgE: <0.1 kU/L
Peanut IgE: <0.1 kU/L
Scallop IgE: <0.1 kU/L
Sesame Seed IgE: <0.1 kU/L
Shrimp IgE: <0.1 kU/L
Soybean IgE: <0.1 kU/L
Walnut IgE: <0.1 kU/L
Wheat IgE: <0.1 kU/L

## 2022-04-29 LAB — TRYPTASE: Tryptase: 4.8 ug/L (ref 2.2–13.2)

## 2022-05-03 DIAGNOSIS — R519 Headache, unspecified: Secondary | ICD-10-CM | POA: Diagnosis not present

## 2022-05-03 DIAGNOSIS — M9901 Segmental and somatic dysfunction of cervical region: Secondary | ICD-10-CM | POA: Diagnosis not present

## 2022-05-03 DIAGNOSIS — M9902 Segmental and somatic dysfunction of thoracic region: Secondary | ICD-10-CM | POA: Diagnosis not present

## 2022-05-03 DIAGNOSIS — M5412 Radiculopathy, cervical region: Secondary | ICD-10-CM | POA: Diagnosis not present

## 2022-05-05 DIAGNOSIS — R519 Headache, unspecified: Secondary | ICD-10-CM | POA: Diagnosis not present

## 2022-05-05 DIAGNOSIS — M9901 Segmental and somatic dysfunction of cervical region: Secondary | ICD-10-CM | POA: Diagnosis not present

## 2022-05-05 DIAGNOSIS — M5412 Radiculopathy, cervical region: Secondary | ICD-10-CM | POA: Diagnosis not present

## 2022-05-05 DIAGNOSIS — M9902 Segmental and somatic dysfunction of thoracic region: Secondary | ICD-10-CM | POA: Diagnosis not present

## 2022-05-19 ENCOUNTER — Ambulatory Visit: Payer: Self-pay | Admitting: Allergy

## 2022-05-21 ENCOUNTER — Other Ambulatory Visit: Payer: Self-pay | Admitting: Unknown Physician Specialty

## 2022-05-21 NOTE — Telephone Encounter (Signed)
Requested Prescriptions  Pending Prescriptions Disp Refills  . montelukast (SINGULAIR) 10 MG tablet [Pharmacy Med Name: MONTELUKAST SOD 10 MG TABLET] 90 tablet 1    Sig: TAKE 1 TABLET BY MOUTH EVERYDAY AT BEDTIME     Pulmonology:  Leukotriene Inhibitors Passed - 05/21/2022  1:59 AM      Passed - Valid encounter within last 12 months    Recent Outpatient Visits          2 months ago Acute non-recurrent frontal sinusitis   [place] P, DO   2 months ago Allergic rhinitis, unspecified seasonality, unspecified trigger   [place], NP   4 months ago Urinary symptom or sign   [place], [place] T, NP   4 months ago Moderate episode of recurrent major depressive disorder ([place])   [place], [name] T, NP      Future Appointments            In 2 months Garnet Sierras, DO [facility] [place]

## 2022-05-24 ENCOUNTER — Other Ambulatory Visit: Payer: Self-pay | Admitting: Family Medicine

## 2022-05-25 NOTE — Telephone Encounter (Signed)
Requested Prescriptions  Pending Prescriptions Disp Refills  . famotidine (PEPCID) 20 MG tablet [Pharmacy Med Name: FAMOTIDINE 20 MG TABLET] 180 tablet 0    Sig: TAKE 1 TABLET BY MOUTH TWICE A DAY     Gastroenterology:  H2 Antagonists Passed - 05/24/2022  2:10 AM      Passed - Valid encounter within last 12 months    Recent Outpatient Visits          2 months ago Acute non-recurrent frontal sinusitis   [facility] [place], Megan P, DO   3 months ago Allergic rhinitis, unspecified seasonality, unspecified trigger   [place], NP   4 months ago Urinary symptom or sign   [place], [place] T, NP   5 months ago Moderate episode of recurrent major depressive disorder ([place])   [place], [name] T, NP      Future Appointments            In 2 months Garnet Sierras, DO [facility] [place]

## 2022-06-20 NOTE — Patient Instructions (Addendum)
Voltaren gel over the counter.  Urinary Tract Infection, Adult A urinary tract infection (UTI) is an infection of any part of the urinary tract. The urinary tract includes: The kidneys. The ureters. The bladder. The urethra. These organs make, store, and get rid of pee (urine) in the body. What are the causes? This infection is caused by germs (bacteria) in your genital area. These germs grow and cause swelling (inflammation) of your urinary tract. What increases the risk? The following factors may make you more likely to develop this condition: Using a small, thin tube (catheter) to drain pee. Not being able to control when you pee or poop (incontinence). Being female. If you are female, these things can increase the risk: Using these methods to prevent pregnancy: A medicine that kills sperm (spermicide). A device that blocks sperm (diaphragm). Having low levels of a female hormone (estrogen). Being pregnant. You are more likely to develop this condition if: You have genes that add to your risk. You are sexually active. You take antibiotic medicines. You have trouble peeing because of: A prostate that is bigger than normal, if you are female. A blockage in the part of your body that drains pee from the bladder. A kidney stone. A nerve condition that affects your bladder. Not getting enough to drink. Not peeing often enough. You have other conditions, such as: Diabetes. A weak disease-fighting system (immune system). Sickle cell disease. Gout. Injury of the spine. What are the signs or symptoms? Symptoms of this condition include: Needing to pee right away. Peeing small amounts often. Pain or burning when peeing. Blood in the pee. Pee that smells bad or not like normal. Trouble peeing. Pee that is cloudy. Fluid coming from the vagina, if you are female. Pain in the belly or lower back. Other symptoms include: Vomiting. Not feeling hungry. Feeling mixed up (confused).  This may be the first symptom in older adults. Being tired and grouchy (irritable). A fever. Watery poop (diarrhea). How is this treated? Taking antibiotic medicine. Taking other medicines. Drinking enough water. In some cases, you may need to see a specialist. Follow these instructions at home:  Medicines Take over-the-counter and prescription medicines only as told by your doctor. If you were prescribed an antibiotic medicine, take it as told by your doctor. Do not stop taking it even if you start to feel better. General instructions Make sure you: Pee until your bladder is empty. Do not hold pee for a long time. Empty your bladder after sex. Wipe from front to back after peeing or pooping if you are a female. Use each tissue one time when you wipe. Drink enough fluid to keep your pee pale yellow. Keep all follow-up visits. Contact a doctor if: You do not get better after 1-2 days. Your symptoms go away and then come back. Get help right away if: You have very bad back pain. You have very bad pain in your lower belly. You have a fever. You have chills. You feeling like you will vomit or you vomit. Summary A urinary tract infection (UTI) is an infection of any part of the urinary tract. This condition is caused by germs in your genital area. There are many risk factors for a UTI. Treatment includes antibiotic medicines. Drink enough fluid to keep your pee pale yellow. This information is not intended to replace advice given to you by your health care provider. Make sure you discuss any questions you have with your health care provider. Document Revised: 06/13/2020 Document  Reviewed: 06/13/2020 Elsevier Patient Education  [place].

## 2022-06-22 ENCOUNTER — Encounter: Payer: Self-pay | Admitting: Nurse Practitioner

## 2022-06-22 ENCOUNTER — Ambulatory Visit (INDEPENDENT_AMBULATORY_CARE_PROVIDER_SITE_OTHER): Payer: Medicare Other | Admitting: Nurse Practitioner

## 2022-06-22 VITALS — BP 112/74 | HR 89 | Temp 98.3°F | Wt 216.4 lb

## 2022-06-22 DIAGNOSIS — M79662 Pain in left lower leg: Secondary | ICD-10-CM | POA: Diagnosis not present

## 2022-06-22 DIAGNOSIS — R8281 Pyuria: Secondary | ICD-10-CM

## 2022-06-22 DIAGNOSIS — R399 Unspecified symptoms and signs involving the genitourinary system: Secondary | ICD-10-CM

## 2022-06-22 LAB — URINALYSIS, ROUTINE W REFLEX MICROSCOPIC
Bilirubin, UA: NEGATIVE
Glucose, UA: NEGATIVE
Ketones, UA: NEGATIVE
Leukocytes,UA: NEGATIVE
Nitrite, UA: NEGATIVE
Protein,UA: NEGATIVE
Specific Gravity, UA: 1.01 (ref 1.005–1.030)
Urobilinogen, Ur: 0.2 mg/dL (ref 0.2–1.0)
pH, UA: 5.5 (ref 5.0–7.5)

## 2022-06-22 LAB — MICROSCOPIC EXAMINATION: WBC, UA: NONE SEEN /hpf (ref 0–5)

## 2022-06-22 LAB — WET PREP FOR TRICH, YEAST, CLUE
Clue Cell Exam: NEGATIVE
Trichomonas Exam: NEGATIVE
Yeast Exam: NEGATIVE

## 2022-06-22 MED ORDER — PANTOPRAZOLE SODIUM 40 MG PO TBEC: 40.0000 mg | DELAYED_RELEASE_TABLET | Freq: Every day | ORAL | 4 refills | Status: DC

## 2022-06-22 NOTE — Assessment & Plan Note (Addendum)
Acute -- at this time urine noting 2+ blood, no Leuks, few bacteria.  Wet prep negative.  Will send for culture and determine if need for treatment.  Avoid Cipro in [place] patients.  Discussed with patient.  Return as scheduled or if worsening.

## 2022-06-22 NOTE — Assessment & Plan Note (Signed)
Ongoing for several months.  Will obtain imaging, although low suspicion for DVT.  Suspect more muscle related spasms.  Recommend trial of Voltaren gel rubbed into area, massage, + continue stretching daily and magnesium at home.  Return if worsening or ongoing.

## 2022-06-22 NOTE — Progress Notes (Signed)
BP 112/74   Pulse 89   Temp 98.3 F (36.8 C) (Oral)   Wt 216 lb 6.4 oz (98.2 kg)   LMP 06/16/2022 (Exact Date)   SpO2 99%   Breastfeeding No   BMI 32.42 kg/m    Subjective:    Patient ID: Stacy West, female    DOB: 25-Sep-1981, 41 y.o.   MRN: [id number]  HPI: Stacy West is a 41 y.o. female  Chief Complaint  Patient presents with   Urinary Symptoms    States she was taken off progesterone and estrogen mid way through her period. States she is having hot flashes. States urinary incontinence ( 1 episode) , a lot of bladder pressure.    Leg Problem     L leg states there is a knot    Continues to take Pepcid daily without benefit, still has reflux issues.  URINARY SYMPTOMS Presents today for urinary symptoms.  Is being followed at [facility] by endo and GYN for PCOS and recurrent pregnancy losses.  Reports she was taken off progesterone and estrogen mid-cycle and symptoms started then.   Dysuria: burning Urinary frequency: no Urgency: yes Small volume voids: yes Symptom severity: yes Urinary incontinence: no Foul odor: no Hematuria: no Abdominal pain: yes Back pain: yes Suprapubic pain/pressure: yes Flank pain: no Fever:  no Vomiting: nausea only Status: stable Previous urinary tract infection: yes Recurrent urinary tract infection: no Sexual activity: monogamous History of sexually transmitted disease: no Treatments attempted: increasing fluids    LEFT LEG KNOT & CRAMPING To left leg, goes away and comes.  Is affecting her walking.  Has to roll foot on lacrosse ball to loosen up.  Lower calf is very tight. Duration: days Location: left lower leg Painful: yes and tight when presents Itching: no Onset: gradual Associated signs and symptoms: painful and tight  Relevant past medical, surgical, family and social history reviewed and updated as indicated. Interim medical history since our last visit reviewed. Allergies and medications reviewed and updated.  Review  of Systems  Constitutional:  Negative for activity change, appetite change, diaphoresis, fatigue and fever.  Respiratory:  Negative for cough, chest tightness and shortness of breath.   Cardiovascular:  Negative for chest pain, palpitations and leg swelling.  Gastrointestinal:  Positive for abdominal distention, abdominal pain and nausea. Negative for diarrhea and vomiting.  Genitourinary:  Positive for decreased urine volume, dysuria and urgency. Negative for frequency, hematuria and vaginal discharge.  Musculoskeletal:  Positive for arthralgias.  Neurological: Negative.   Psychiatric/Behavioral: Negative.      Per HPI unless specifically indicated above     Objective:    BP 112/74   Pulse 89   Temp 98.3 F (36.8 C) (Oral)   Wt 216 lb 6.4 oz (98.2 kg)   LMP 06/16/2022 (Exact Date)   SpO2 99%   Breastfeeding No   BMI 32.42 kg/m   Wt Readings from Last 3 Encounters:  06/22/22 216 lb 6.4 oz (98.2 kg)  04/26/22 219 lb 6 oz (99.5 kg)  03/02/22 211 lb 12.8 oz (96.1 kg)    Physical Exam Vitals and nursing note reviewed.  Constitutional:      General: She is awake. She is not in acute distress.    Appearance: She is well-developed and well-groomed. She is obese. She is not ill-appearing or toxic-appearing.  HENT:     Head: Normocephalic.     Right Ear: Hearing normal.     Left Ear: Hearing normal.  Eyes:  General: Lids are normal.        Right eye: No discharge.        Left eye: No discharge.     Conjunctiva/sclera: Conjunctivae normal.     Pupils: Pupils are equal, round, and reactive to light.  Neck:     Thyroid: No thyromegaly.     Vascular: No carotid bruit.  Cardiovascular:     Rate and Rhythm: Normal rate and regular rhythm.     Heart sounds: Normal heart sounds. No murmur heard.    No gallop.  Pulmonary:     Effort: Pulmonary effort is normal. No accessory muscle usage or respiratory distress.     Breath sounds: Normal breath sounds.  Abdominal:      General: Bowel sounds are normal.     Palpations: Abdomen is soft. There is no hepatomegaly or splenomegaly.  Musculoskeletal:     Cervical back: Normal range of motion and neck supple.     Right lower leg: No deformity, tenderness or bony tenderness. No edema.     Left lower leg: No deformity, tenderness or bony tenderness. No edema.  Skin:    General: Skin is warm and dry.  Neurological:     Mental Status: She is alert and oriented to person, place, and time.  Psychiatric:        Attention and Perception: Attention normal.        Mood and Affect: Mood normal.        Speech: Speech normal.        Behavior: Behavior normal. Behavior is cooperative.        Thought Content: Thought content normal.     Results for orders placed or performed in visit on 04/26/22  Tryptase  Result Value Ref Range   Tryptase 4.8 2.2 - 13.2 ug/L  Allergens w/Total IgE Area 2  Result Value Ref Range   Class Description Allergens Comment    IgE (Immunoglobulin E), Serum 6 6 - 495 IU/mL   D Pteronyssinus IgE <0.10 Class 0 kU/L   D Farinae IgE <0.10 Class 0 kU/L   Cat Dander IgE <0.10 Class 0 kU/L   Dog Dander IgE <0.10 Class 0 kU/L   [place] Grass IgE <0.10 Class 0 kU/L   Timothy Grass IgE <0.10 Class 0 kU/L   Johnson Grass IgE <0.10 Class 0 kU/L   Cockroach, German IgE <0.10 Class 0 kU/L   Penicillium Chrysogen IgE <0.10 Class 0 kU/L   Cladosporium Herbarum IgE <0.10 Class 0 kU/L   Aspergillus Fumigatus IgE <0.10 Class 0 kU/L   Alternaria Alternata IgE <0.10 Class 0 kU/L   Maple/Box Elder IgE <0.10 Class 0 kU/L   Common Silver Wendee Copp IgE <0.10 Class 0 kU/L   Cedar, Georgia IgE <0.10 Class 0 kU/L   Oak, White IgE <0.10 Class 0 kU/L   Elm, American IgE <0.10 Class 0 kU/L   Cottonwood IgE <0.10 Class 0 kU/L   Pecan, Hickory IgE <0.10 Class 0 kU/L   White Mulberry IgE <0.10 Class 0 kU/L   Ragweed, Short IgE <0.10 Class 0 kU/L   Pigweed, Rough IgE <0.10 Class 0 kU/L   Sheep Sorrel IgE Qn <0.10  Class 0 kU/L   Mouse Urine IgE <0.10 Class 0 kU/L  Food Allergy Profile  Result Value Ref Range   Egg White IgE <0.10 Class 0 kU/L   Peanut IgE <0.10 Class 0 kU/L   Soybean IgE <0.10 Class 0 kU/L   Milk IgE <0.10 Class 0 kU/L  Clam IgE <0.10 Class 0 kU/L   Shrimp IgE <0.10 Class 0 kU/L   Walnut IgE <0.10 Class 0 kU/L   Codfish IgE <0.10 Class 0 kU/L   Scallop IgE <0.10 Class 0 kU/L   Wheat IgE <0.10 Class 0 kU/L   Allergen Corn, IgE <0.10 Class 0 kU/L   Sesame Seed IgE <0.10 Class 0 kU/L      Assessment & Plan:   Problem List Items Addressed This Visit       Other   Pain in left lower leg    Ongoing for several months.  Will obtain imaging, although low suspicion for DVT.  Suspect more muscle related spasms.  Recommend trial of Voltaren gel rubbed into area, massage, + continue stretching daily and magnesium at home.  Return if worsening or ongoing.      Relevant Orders   US Venous Img Lower Unilateral Left (DVT)   Urinary symptom or sign - Primary    Acute -- at this time urine noting 2+ blood, no Leuks, few bacteria.  Wet prep negative.  Will send for culture and determine if need for treatment.  Avoid Cipro in [place] patients.  Discussed with patient.  Return as scheduled or if worsening.      Relevant Orders   Urinalysis, Routine w reflex microscopic   WET PREP FOR TRICH, YEAST, CLUE   Other Visit Diagnoses     Pyuria       Urine for culture.   Relevant Orders   Urine Culture        Follow up plan: Return if symptoms worsen or fail to improve.

## 2022-06-24 ENCOUNTER — Encounter: Payer: Self-pay | Admitting: Nurse Practitioner

## 2022-06-24 LAB — URINE CULTURE

## 2022-06-24 NOTE — Progress Notes (Signed)
Contacted via MyChart   Good morning Stacy West, urine returned showing no infection:)

## 2022-06-28 DIAGNOSIS — M9901 Segmental and somatic dysfunction of cervical region: Secondary | ICD-10-CM | POA: Diagnosis not present

## 2022-06-28 DIAGNOSIS — R519 Headache, unspecified: Secondary | ICD-10-CM | POA: Diagnosis not present

## 2022-06-28 DIAGNOSIS — M9902 Segmental and somatic dysfunction of thoracic region: Secondary | ICD-10-CM | POA: Diagnosis not present

## 2022-06-28 DIAGNOSIS — M5412 Radiculopathy, cervical region: Secondary | ICD-10-CM | POA: Diagnosis not present

## 2022-06-29 ENCOUNTER — Encounter: Payer: Self-pay | Admitting: Physician Assistant

## 2022-06-29 ENCOUNTER — Ambulatory Visit (INDEPENDENT_AMBULATORY_CARE_PROVIDER_SITE_OTHER): Payer: Medicare Other | Admitting: Physician Assistant

## 2022-06-29 VITALS — BP 110/67 | HR 82 | Temp 98.5°F | Ht 68.5 in | Wt 218.1 lb

## 2022-06-29 DIAGNOSIS — Z23 Encounter for immunization: Secondary | ICD-10-CM | POA: Diagnosis not present

## 2022-06-29 DIAGNOSIS — Z111 Encounter for screening for respiratory tuberculosis: Secondary | ICD-10-CM

## 2022-06-29 DIAGNOSIS — L309 Dermatitis, unspecified: Secondary | ICD-10-CM

## 2022-06-29 MED ORDER — PREDNISONE 20 MG PO TABS: ORAL_TABLET | ORAL | 0 refills | Status: DC

## 2022-06-29 NOTE — Patient Instructions (Addendum)
Based on the physical exam today and your answers to my questions I believe you have a condition called allergic contact dermatitis from coming into contact with an irritant   In order to help you feel better I recommend the following:  Using a topical treatment such as Calamine lotion, Benadryl cream, Cortisone cream as needed Avoiding scratching the rash as this can lead to secondary infection Gently cleanse the skin with warm water and gentle soap- no harsh abrasives or scrubbing is needed No alcohol, bacitracin or peroxide as these can cause more irritation and damage to the skin while it is healing  I am sending in a script for a Prednisone taper. Steroids can cause the following: sleeplessness, increased appetite, elevated blood sugars, and increased irritability. I recommend taking it in the morning to prevent trouble sleeping and try to make changes to your diet to avoid excess sugar if you are diabetic   Continue your antihistamines as directed as these can assist with the skin rash  If you may be in the same area that you were exposed to the plant I recommend the following to reduce further irritation  Wear long sleeves and pants with closed shoes and cover your face. Use protective eye-wear to prevent eye contact Once finished in the area with the plant, strip your clothing and place it immediately in the wash to be cleaned on the hottest setting with detergent Take a shower and be sure to wash thoroughly to prevent any of the plant oils from lingering on your skin (if you are exposed again without protective clothing- wash your skin immediately with soap and water)  If you think any pets may have been exposed to these plants they will need to be bathed as well. The oils can stay on their hair for some time and lead to further exposure and rash for you.   Please let us know if you have any further questions or concerns.

## 2022-06-29 NOTE — Progress Notes (Signed)
Acute Office Visit   Patient: Stacy West   DOB: 09/07/1981   41 y.o. Female  MRN: [id number] Visit Date: 06/29/2022  Today's healthcare provider: Dani Gobble Wyndham Santilli, PA-C  Introduced myself to the patient as a Journalist, newspaper and provided education on APPs in clinical practice.    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 Patient states that has been there for the past 2 days, patient states that it is very itchy and swollen, has been itching it for 2 days all well.   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 Patient states that has been there for the past 2 days, patient states that it is very itchy and swollen, has been itching it for 2 days all well.      Last edited by Jerelene Redden, CMA on 06/29/2022  9:25 AM.        Rash  States she notices the iching about 48 hours ago  She denies exposure to poison ivy type plants  States she has tried Calamine lotion, lidocaine spray, bacitracin, ice,  She is taking Xyzal and Singulair chronically     Spotting Reports she just had a menses 06/16/22-06/23/22 States she had clots and lots of dark blood during this States she is having more spotting now again and is having some clots.  Recently stopped her progesterone and estrogen per OBGYN recommendations  She has been advised to see fertility for management as well- trying to set up an apt at this time      Medications: Outpatient Medications Prior to Visit  Medication Sig   cyclobenzaprine (FLEXERIL) 10 MG tablet Take 1 tablet (10 mg total) by mouth 3 (three) times daily as needed for muscle spasms.   famotidine (PEPCID) 20 MG tablet TAKE 1 TABLET BY MOUTH TWICE A DAY   FIBER SELECT GUMMIES PO Take by mouth.   levocetirizine  (XYZAL) 5 MG tablet Take 1 tablet (5 mg total) by mouth every evening.   metFORMIN (GLUCOPHAGE-XR) 500 MG 24 hr tablet Take 2 tablets by mouth 2 (two) times daily.   metoprolol succinate (TOPROL-XL) 25 MG 24 hr tablet Take 25 mg by mouth daily.   montelukast (SINGULAIR) 10 MG tablet TAKE 1 TABLET BY MOUTH EVERYDAY AT BEDTIME   pantoprazole (PROTONIX) 40 MG tablet Take 1 tablet (40 mg total) by mouth daily.   medroxyPROGESTERone (PROVERA) 10 MG tablet TAKE 2 TABLETS (20 MG TOTAL) BY MOUTH DAILY. USE FOR 14 DAYS EACH MONTH (Patient not taking: Reported on 06/29/2022)   Omega-3 1000 MG CAPS Take 1 capsule by mouth every morning. (Patient not taking: Reported on 06/29/2022)   pregabalin (LYRICA) 50 MG capsule Take by mouth. (Patient not taking: Reported on 06/29/2022)   No facility-administered medications prior to visit.    Review of Systems  Genitourinary:  Positive for vaginal bleeding.  Skin:  Positive for rash.       Objective    BP 110/67   Pulse 82   Temp 98.5 F (36.9 C) (Oral)   Ht 5' 8.5" (1.74 m)   Wt 218 lb 1.6 oz (98.9  kg)   LMP 06/16/2022 (Exact Date)   SpO2 98%   BMI 32.68 kg/m    Physical Exam Vitals reviewed.  Constitutional:      General: She is awake.     Appearance: Normal appearance. She is well-developed, well-groomed and overweight.  HENT:     Head: Normocephalic and atraumatic.  Eyes:     General: Lids are normal. Gaze aligned appropriately. No allergic shiner or scleral icterus.    Extraocular Movements: Extraocular movements intact.     Conjunctiva/sclera: Conjunctivae normal.  Pulmonary:     Effort: Pulmonary effort is normal.  Skin:    General: Skin is warm.     Findings: Erythema, lesion and rash present. No laceration. Rash is papular and vesicular.       Neurological:     Mental Status: She is alert and oriented to person, place, and time.     GCS: GCS eye subscore is 4. GCS verbal subscore is 5. GCS motor subscore is 6.     Cranial Nerves:  No dysarthria or facial asymmetry.  Psychiatric:        Attention and Perception: Attention normal.        Mood and Affect: Mood is anxious.        Speech: Speech normal.        Behavior: Behavior is cooperative.     Comments: Animated and rapid speech       No results found for any visits on 06/29/22.  Assessment & Plan      No follow-ups on file.       Problem List Items Addressed This Visit   None Visit Diagnoses     Dermatitis due to unknown cause    -  Primary Acute, new problem Reports she developed a rash that is intensely pruritic on the back of her left knee about 2 days ago Reviewed common causes of dermatitis with her  Recommend gentle washes with soap and water, no harsh scrubbing or chemicals, may use topicals as desired for itching relief Will send in Prednisone taper for 1 week due to patient's hesitancy surrounding steroids and her Ehlers-Danlos syndrome  Follow up as needed for persistent or worsening symptoms    Relevant Medications   predniSONE (DELTASONE) 20 MG tablet   Need for Tdap vaccination       Relevant Orders   Tdap vaccine greater than or equal to 7yo IM   Screening-pulmonary TB     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   I, Pennelope Basque E Retia Cordle, PA-C, have reviewed all documentation for this visit. The documentation on 06/29/22 for the exam, diagnosis, procedures, and orders are all accurate and complete.   Talitha Givens, MHS, PA-C [facility]

## 2022-07-02 LAB — QUANTIFERON-TB GOLD PLUS
QuantiFERON Mitogen Value: >10 IU/mL
QuantiFERON Nil Value: 0.02 IU/mL
QuantiFERON TB1 Ag Value: 0.01 IU/mL
QuantiFERON TB2 Ag Value: 0.01 IU/mL
QuantiFERON-TB Gold Plus: NEGATIVE

## 2022-07-05 DIAGNOSIS — G8929 Other chronic pain: Secondary | ICD-10-CM | POA: Diagnosis not present

## 2022-07-05 DIAGNOSIS — Q7962 Hypermobile Ehlers-Danlos syndrome: Secondary | ICD-10-CM | POA: Diagnosis not present

## 2022-07-05 DIAGNOSIS — M533 Sacrococcygeal disorders, not elsewhere classified: Secondary | ICD-10-CM | POA: Diagnosis not present

## 2022-07-13 ENCOUNTER — Telehealth: Payer: Self-pay

## 2022-07-13 NOTE — Telephone Encounter (Signed)
Copied from [place] [id number]. 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 Ref 08/10 my chart message. Please call back

## 2022-07-13 NOTE — Telephone Encounter (Signed)
Per front desk staff Iris states patient's husband picked up requested forms yesterday 07/12/22.

## 2022-07-16 ENCOUNTER — Encounter: Payer: Self-pay | Admitting: Nurse Practitioner

## 2022-07-28 ENCOUNTER — Ambulatory Visit: Payer: Medicare Other | Admitting: Allergy

## 2022-08-04 DIAGNOSIS — Q7962 Hypermobile Ehlers-Danlos syndrome: Secondary | ICD-10-CM | POA: Diagnosis not present

## 2022-08-04 DIAGNOSIS — M533 Sacrococcygeal disorders, not elsewhere classified: Secondary | ICD-10-CM | POA: Diagnosis not present

## 2022-08-04 DIAGNOSIS — G8929 Other chronic pain: Secondary | ICD-10-CM | POA: Diagnosis not present

## 2022-08-19 ENCOUNTER — Other Ambulatory Visit: Payer: Self-pay | Admitting: Nurse Practitioner

## 2022-08-19 ENCOUNTER — Telehealth: Payer: Self-pay | Admitting: Nurse Practitioner

## 2022-08-19 NOTE — Telephone Encounter (Signed)
Requested Prescriptions  Pending Prescriptions Disp Refills  . famotidine (PEPCID) 20 MG tablet [Pharmacy Med Name: FAMOTIDINE 20 MG TABLET] 180 tablet 0    Sig: TAKE 1 TABLET BY MOUTH TWICE A DAY     Gastroenterology:  H2 Antagonists Passed - 08/19/2022  2:49 AM      Passed - Valid encounter within last 12 months    Recent Outpatient Visits          1 month ago Dermatitis due to unknown cause   [facility], Erin E, PA-C   1 month ago Urinary symptom or sign   [employer], Fenton T, NP   5 months ago Acute non-recurrent frontal sinusitis   [employer], Megan P, DO   5 months ago Allergic rhinitis, unspecified seasonality, unspecified trigger   [facility] Kathrine Haddock, NP   7 months ago Urinary symptom or sign   [facility] [place], Barbaraann Faster, NP

## 2022-08-19 NOTE — Telephone Encounter (Signed)
Copied from [place] [phone]. Topic: Referral - Question >> Aug 19, 2022 10:37 AM Penni Bombard wrote: Reason for CRM: Pt called saying her insurance is starting to deny her visit with her therapist.  She said they are needing some from her primary saying she does need to see this particular person.  She is in network but the insurance thinks she does not need to go.  She said they cancelled her appt for tomorrow.  CB@  [phone]

## 2022-08-20 ENCOUNTER — Encounter: Payer: Self-pay | Admitting: Nurse Practitioner

## 2022-08-20 NOTE — Telephone Encounter (Signed)
Spoke with patient and made her aware of [name]'s recommendations. Patient will send requested information in MyChart for the provider.

## 2022-08-20 NOTE — Telephone Encounter (Signed)
Noted  

## 2022-08-24 DIAGNOSIS — M9901 Segmental and somatic dysfunction of cervical region: Secondary | ICD-10-CM | POA: Diagnosis not present

## 2022-08-24 DIAGNOSIS — M9902 Segmental and somatic dysfunction of thoracic region: Secondary | ICD-10-CM | POA: Diagnosis not present

## 2022-08-24 DIAGNOSIS — M5412 Radiculopathy, cervical region: Secondary | ICD-10-CM | POA: Diagnosis not present

## 2022-08-24 DIAGNOSIS — R519 Headache, unspecified: Secondary | ICD-10-CM | POA: Diagnosis not present

## 2022-08-24 NOTE — Telephone Encounter (Signed)
Spoke with patient via [place] before noon to gather information in regards to requested letter and fax information. Patient provided and faxed to [place].

## 2022-08-24 NOTE — Telephone Encounter (Signed)
As per caller please fax referral and medical necessity to [facility] phone # [phone] fax # [phone]. Please note when completed.

## 2022-08-25 ENCOUNTER — Other Ambulatory Visit: Payer: Self-pay | Admitting: Family Medicine

## 2022-08-25 NOTE — Telephone Encounter (Signed)
Requested Prescriptions  Pending Prescriptions Disp Refills  . levocetirizine (XYZAL) 5 MG tablet [Pharmacy Med Name: LEVOCETIRIZINE 5 MG TABLET] 90 tablet 1    Sig: TAKE 1 TABLET BY MOUTH EVERY DAY IN THE EVENING     Ear, Nose, and Throat:  Antihistamines - levocetirizine dihydrochloride Passed - 08/25/2022  2:21 AM      Passed - Cr in normal range and within 360 days    Creatinine, Ser  Date Value Ref Range Status  12/25/2021 0.75 0.57 - 1.00 mg/dL Final         Passed - eGFR is 10 or above and within 360 days    GFR calc Af Amer  Date Value Ref Range Status  05/25/2019 >60 >60 mL/min Final   GFR calc non Af Amer  Date Value Ref Range Status  05/25/2019 >60 >60 mL/min Final   eGFR  Date Value Ref Range Status  12/25/2021 104 >59 mL/min/1.73 Final         Passed - Valid encounter within last 12 months    Recent Outpatient Visits          1 month ago Dermatitis due to unknown cause   [employer], Erin E, PA-C   2 months ago Urinary symptom or sign   [employer], Chamita T, NP   5 months ago Acute non-recurrent frontal sinusitis   [employer], Megan P, DO   6 months ago Allergic rhinitis, unspecified seasonality, unspecified trigger   [place] Kathrine Haddock, NP   7 months ago Urinary symptom or sign   [facility] [place], Barbaraann Faster, NP

## 2022-08-30 DIAGNOSIS — Q7962 Hypermobile Ehlers-Danlos syndrome: Secondary | ICD-10-CM | POA: Diagnosis not present

## 2022-08-30 DIAGNOSIS — G8929 Other chronic pain: Secondary | ICD-10-CM | POA: Diagnosis not present

## 2022-08-30 DIAGNOSIS — M533 Sacrococcygeal disorders, not elsewhere classified: Secondary | ICD-10-CM | POA: Diagnosis not present

## 2022-08-30 DIAGNOSIS — I82402 Acute embolism and thrombosis of unspecified deep veins of left lower extremity: Secondary | ICD-10-CM | POA: Diagnosis not present

## 2022-08-30 DIAGNOSIS — I341 Nonrheumatic mitral (valve) prolapse: Secondary | ICD-10-CM | POA: Diagnosis not present

## 2022-09-01 DIAGNOSIS — M9901 Segmental and somatic dysfunction of cervical region: Secondary | ICD-10-CM | POA: Diagnosis not present

## 2022-09-01 DIAGNOSIS — R519 Headache, unspecified: Secondary | ICD-10-CM | POA: Diagnosis not present

## 2022-09-01 DIAGNOSIS — M5412 Radiculopathy, cervical region: Secondary | ICD-10-CM | POA: Diagnosis not present

## 2022-09-01 DIAGNOSIS — M9902 Segmental and somatic dysfunction of thoracic region: Secondary | ICD-10-CM | POA: Diagnosis not present

## 2022-09-17 DIAGNOSIS — G8929 Other chronic pain: Secondary | ICD-10-CM | POA: Diagnosis not present

## 2022-09-17 DIAGNOSIS — M533 Sacrococcygeal disorders, not elsewhere classified: Secondary | ICD-10-CM | POA: Diagnosis not present

## 2022-09-17 DIAGNOSIS — Q7962 Hypermobile Ehlers-Danlos syndrome: Secondary | ICD-10-CM | POA: Diagnosis not present

## 2022-10-01 ENCOUNTER — Telehealth: Payer: Self-pay | Admitting: Nurse Practitioner

## 2022-10-01 NOTE — Telephone Encounter (Signed)
Copied from [place] [id number]. Topic: General - Inquiry >> Oct 01, 2022 12:31 PM Erskine Squibb wrote: Reason for CRM: Ace with [facility] called in stating he needs some prior authorization information in order to approve the patients need for pyschotherapy. Please assist further

## 2022-10-13 ENCOUNTER — Ambulatory Visit: Payer: Self-pay | Admitting: *Deleted

## 2022-10-13 ENCOUNTER — Telehealth: Payer: Self-pay

## 2022-10-13 NOTE — Telephone Encounter (Unsigned)
Copied from [place] [id number]. Topic: General - Inquiry >> Oct 13, 2022  9:20 AM Leilani Able wrote: PT has a summons for [facility] duty on Jan 29 @ 8:15 am. Pt wants a letter to excuse info below her info.. she will ck my chart or return fu call within a week if she has not heard. [place] release [address] [place] [address]  Contact# [phone]

## 2022-10-13 NOTE — Telephone Encounter (Signed)
  Chief Complaint: low temp 92-93 degrees, see hx, issues with regulating temp. Cold sx Symptoms: runny nose, cough congestion, gray sputum , post nasal drip, ears ringing. Dizziness. Taking temp with forehead thermometer and is accurate with other members of family. Frequency: 10/10/22 Pertinent Negatives: Patient denies chest pain no difficulty breathing no fever.  Disposition: [] ED /[] Urgent Care (no appt availability in office) / [] Appointment(In office/virtual)/ []  [facility]/ [x] Home Care/ [] Refused Recommended Disposition /[] [facility]/ [x]  Follow-up with PCP Additional Notes:   Recommended to schedule appt. None available with PCP . Offered with another provider. Patient would like to know what PCP suggests. Recommended to take covid test to R/O . Encouraged increase drinking water. Patient with hx POTTS and reports she would like to PCP to call back and if sx worsen she will go to UC/ED.    Reason for Disposition  Care advice for mild cough, questions about  Answer Assessment - Initial Assessment Questions 1. ONSET: "When did the nasal discharge start?"      10/09/22 2. AMOUNT: "How much discharge is there?"      "Some"  3. COUGH: "Do you have a cough?" If Yes, ask: "Describe the color of your sputum" (clear, white, yellow, green)     Yes - gray sputum 4. RESPIRATORY DISTRESS: "Describe your breathing."      Normal  5. FEVER: "Do you have a fever?" If Yes, ask: "What is your temperature, how was it measured, and when did it start?"     Low temp 92-93 degrees 6. SEVERITY: "Overall, how bad are you feeling right now?" (e.g., doesn't interfere with normal activities, staying home from school/work, staying in bed)      "Not that bad", hx POTTS, trouble regulating temp.  7. OTHER SYMPTOMS: "Do you have any other symptoms?" (e.g., sore throat, earache, wheezing, vomiting)     Runny nose cough post nasal drip, ears ringing, low temp chills , dizziness 8.  PREGNANCY: "Is there any chance you are pregnant?" "When was your last menstrual period?"     na  Protocols used: Common Cold-A-AH

## 2022-10-13 NOTE — Telephone Encounter (Signed)
Patient was called and appt scheduled for tomorrow at 78 with [facility]

## 2022-10-14 ENCOUNTER — Encounter: Payer: Self-pay | Admitting: Allergy

## 2022-10-14 ENCOUNTER — Ambulatory Visit (INDEPENDENT_AMBULATORY_CARE_PROVIDER_SITE_OTHER): Payer: Medicare Other | Admitting: Physician Assistant

## 2022-10-14 ENCOUNTER — Telehealth: Payer: Self-pay | Admitting: Nurse Practitioner

## 2022-10-14 ENCOUNTER — Ambulatory Visit: Payer: Medicare Other | Admitting: Physician Assistant

## 2022-10-14 ENCOUNTER — Encounter: Payer: Self-pay | Admitting: Physician Assistant

## 2022-10-14 VITALS — BP 113/76 | HR 86 | Temp 99.1°F | Wt 206.0 lb

## 2022-10-14 DIAGNOSIS — R0989 Other specified symptoms and signs involving the circulatory and respiratory systems: Secondary | ICD-10-CM | POA: Diagnosis not present

## 2022-10-14 DIAGNOSIS — D8940 Mast cell activation, unspecified: Secondary | ICD-10-CM | POA: Diagnosis not present

## 2022-10-14 MED ORDER — CETIRIZINE HCL 10 MG PO TABS: 10.0000 mg | ORAL_TABLET | Freq: Every day | ORAL | 3 refills | Status: DC

## 2022-10-14 NOTE — Progress Notes (Signed)
Acute Office Visit   Patient: Stacy West   DOB: 1981-01-09   40 y.o. Female  MRN: [id number] Visit Date: 10/14/2022  Today's healthcare provider: Dani Gobble Idalee Foxworthy, PA-C  Introduced myself to the patient as a Journalist, newspaper and provided education on APPs in clinical practice.    Chief Complaint  Patient presents with   URI    Patient states she has cough, runny nose, and states she has problems with her temperature. Temperature has been dropping to 90 degrees per patient, using forehead monitor. Patient states right ear has ringing in it.    Subjective    URI  Associated symptoms include congestion, coughing and rhinorrhea. Pertinent negatives include no sore throat.   HPI     URI    Additional comments: Patient states she has cough, runny nose, and states she has problems with her temperature. Temperature has been dropping to 90 degrees per patient, using forehead monitor. Patient states right ear has ringing in it.       Last edited by Louanna Raw, Adams on 10/14/2022  1:58 PM.        Reports she is having ear ringing States she is also having difficulty "regulating her temperature" States her temperature will drop to low 90s and she gets flushed then it will jump to upper 90s  Interventions: She has been taking Singulair and Pepcid, Xyzal but these are not improving her symptoms and she is concerned that the antihistamines are no longer managing her mast cell activation  She has been taking Sudafed for congestion   Reports she was having issues with sweating while having low temperatures She reports nasal congestion and productive coughing She has been having wheezing and SOB but states "I think that's usual for me"  When asked if this is typical of her mast cell activation vs acute URI she was not sure but was adamant that her antihistamines be changed to "something stronger"    Medications: Outpatient Medications Prior to Visit  Medication Sig   cyclobenzaprine  (FLEXERIL) 10 MG tablet Take 1 tablet (10 mg total) by mouth 3 (three) times daily as needed for muscle spasms.   famotidine (PEPCID) 20 MG tablet TAKE 1 TABLET BY MOUTH TWICE A DAY   FIBER SELECT GUMMIES PO Take by mouth.   metFORMIN (GLUCOPHAGE-XR) 500 MG 24 hr tablet Take 2 tablets by mouth 2 (two) times daily.   metoprolol succinate (TOPROL-XL) 25 MG 24 hr tablet Take 25 mg by mouth daily.   montelukast (SINGULAIR) 10 MG tablet TAKE 1 TABLET BY MOUTH EVERYDAY AT BEDTIME   Omega-3 1000 MG CAPS Take 1 capsule by mouth every morning.   pantoprazole (PROTONIX) 40 MG tablet Take 1 tablet (40 mg total) by mouth daily.   pregabalin (LYRICA) 50 MG capsule Take by mouth.   [DISCONTINUED] levocetirizine (XYZAL) 5 MG tablet TAKE 1 TABLET BY MOUTH EVERY DAY IN THE EVENING   medroxyPROGESTERone (PROVERA) 10 MG tablet TAKE 2 TABLETS (20 MG TOTAL) BY MOUTH DAILY. USE FOR 14 DAYS EACH MONTH (Patient not taking: Reported on 06/29/2022)   [DISCONTINUED] predniSONE (DELTASONE) 20 MG tablet Take 24m PO daily x 2 days, then478mPO daily x 2 days, then 2053mO daily x 3 days (Patient not taking: Reported on 10/14/2022)   No facility-administered medications prior to visit.    Review of Systems  Constitutional:  Positive for chills and fever.  HENT:  Positive for congestion, postnasal drip and rhinorrhea.  Negative for sore throat.   Respiratory:  Positive for cough.        Objective    BP 113/76   Pulse 86   Temp 99.1 F (37.3 C)   Wt 206 lb (93.4 kg)   SpO2 98%   BMI 30.86 kg/m    Physical Exam Vitals reviewed.  Constitutional:      General: She is awake.     Appearance: Normal appearance. She is well-developed and well-groomed.  HENT:     Head: Normocephalic and atraumatic.     Right Ear: Ear canal and external ear normal. Tympanic membrane is erythematous. Tympanic membrane is not injected, scarred, perforated, retracted or bulging.     Left Ear: Tympanic membrane, ear canal and external  ear normal. Tympanic membrane is not injected, scarred, perforated, erythematous, retracted or bulging.     Mouth/Throat:     Mouth: Mucous membranes are moist.     Pharynx: Oropharynx is clear. Uvula midline. No oropharyngeal exudate or posterior oropharyngeal erythema.  Cardiovascular:     Rate and Rhythm: Normal rate and regular rhythm.     Heart sounds: Normal heart sounds. No murmur heard.    No friction rub. No gallop.  Pulmonary:     Effort: Pulmonary effort is normal.     Breath sounds: Normal breath sounds. No decreased air movement. No decreased breath sounds, wheezing, rhonchi or rales.  Lymphadenopathy:     Head:     Right side of head: No submental, submandibular or preauricular adenopathy.     Left side of head: No submental, submandibular or preauricular adenopathy.     Cervical:     Right cervical: No superficial or posterior cervical adenopathy.    Left cervical: No superficial or posterior cervical adenopathy.  Neurological:     Mental Status: She is alert.  Psychiatric:        Attention and Perception: Attention and perception normal.        Mood and Affect: Mood and affect normal.        Speech: Speech normal.        Behavior: Behavior normal. Behavior is cooperative.       No results found for any visits on 10/14/22.  Assessment & Plan      No follow-ups on file.      Problem List Items Addressed This Visit       Other   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[place]) - Primary    Chronic, ongoing concern Patient states she is having cough, runny nose, and temperature fluctuations which she has attributed to a flare of her mast cell activation in the past She is taking Xyzal, Singulair and pepcid for management at this time and wishes for both of her antihistamines to be changed to "something stronger"  Offered to send in script for Zyrtec to replace Xyzal but that Singulair is most potent antihistamine that I am aware of and anything stronger would need consultation with  Allergy specialty  Recommend she reach out to Allergy specialty for assistance with current flare as they will likely have better insight into a better antihistamine regimen - patient sent Mychart message to specialty during apt, will defer to their recommendations       Relevant Medications   cetirizine (ZYRTEC) 10 MG tablet   Other Visit Diagnoses     Symptoms of upper respiratory infection (URI)     Acute, new concern Patient reports symptoms of nasal congestion and coughing but it is unclear if this  is due to infectious process or historic mast cell activation  Provided requested changes to antihistamines today  Patient has been advised of OTC cough and cold medications by Allergy specialty Follow up as needed for persistent or progressing symptoms.          No follow-ups on file.   I, Sylus Stgermain E Makenzi Bannister, PA-C, have reviewed all documentation for this visit. The documentation on 10/15/22 for the exam, diagnosis, procedures, and orders are all accurate and complete.   Talitha Givens, MHS, PA-C [facility]

## 2022-10-14 NOTE — Progress Notes (Deleted)
Established Patient Office Visit  Name: Stacy West   MRN: [id number]    DOB: 23-Feb-1981   Date:10/14/2022  Today's Provider: Talitha Givens, MHS, PA-C Introduced myself to the patient as a PA-C and provided education on APPs in clinical practice.         Subjective  Chief Complaint  No chief complaint on file.   HPI   Patient Active Problem List   Diagnosis Date Noted   Pain in left lower leg 06/22/2022   Other allergic rhinitis 04/26/2022   Chronic cough 04/26/2022   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[place]) 02/23/2022   Urinary symptom or sign 01/06/2022   Chronic neck pain 12/20/2021   Chronic pain syndrome 12/20/2021   Fibromyalgia 12/20/2021   Sinus tachycardia 08/17/2021   Gastroesophageal reflux disease 05/07/2021   Moderate episode of recurrent major depressive disorder ([place]) 01/15/2021   Celiac disease 01/31/2020   Right ovarian cyst 08/15/2019   Cervical spondylosis without myelopathy 07/04/2019   MVP (mitral valve prolapse) 05/31/2019   Ehlers-Danlos syndrome type III 05/17/2019   PCOS (polycystic ovarian syndrome) 05/17/2019   Insulin resistance 10/21/2016   Chronic fatigue syndrome with fibromyalgia 01/31/2007   Degenerative disc disease at L5-S1 level 01/31/2007    Past Surgical History:  Procedure Laterality Date   ADENOIDECTOMY     BACK SURGERY     COLPOSCOPY     FINGER SURGERY Left    NECK SURGERY  07/2020   TONSILLECTOMY     WISDOM TOOTH EXTRACTION      Family History  Problem Relation Age of Onset   Heart murmur Mother    Asthma Mother    Skin cancer Father    COPD Father    Bipolar disorder Brother    Depression Brother    Alcoholism Brother    Depression Brother    Heart murmur Maternal Aunt    Breast cancer Maternal Aunt    Early menopause Maternal Aunt    Asthma Maternal Grandmother     Social History   Tobacco Use   Smoking status: Former    Types: Cigarettes    Quit date: 2018    Years since quitting: 5.9   Smokeless  tobacco: Never  Substance Use Topics   Alcohol use: Not Currently    Comment: occ     Current Outpatient Medications:    cyclobenzaprine (FLEXERIL) 10 MG tablet, Take 1 tablet (10 mg total) by mouth 3 (three) times daily as needed for muscle spasms., Disp: 270 tablet, Rfl: 4   famotidine (PEPCID) 20 MG tablet, TAKE 1 TABLET BY MOUTH TWICE A DAY, Disp: 180 tablet, Rfl: 0   FIBER SELECT GUMMIES PO, Take by mouth., Disp: , Rfl:    levocetirizine (XYZAL) 5 MG tablet, TAKE 1 TABLET BY MOUTH EVERY DAY IN THE EVENING, Disp: 90 tablet, Rfl: 1   medroxyPROGESTERone (PROVERA) 10 MG tablet, TAKE 2 TABLETS (20 MG TOTAL) BY MOUTH DAILY. USE FOR 14 DAYS EACH MONTH (Patient not taking: Reported on 06/29/2022), Disp: 30 tablet, Rfl: 4   metFORMIN (GLUCOPHAGE-XR) 500 MG 24 hr tablet, Take 2 tablets by mouth 2 (two) times daily., Disp: , Rfl:    metoprolol succinate (TOPROL-XL) 25 MG 24 hr tablet, Take 25 mg by mouth daily., Disp: , Rfl:    montelukast (SINGULAIR) 10 MG tablet, TAKE 1 TABLET BY MOUTH EVERYDAY AT BEDTIME, Disp: 90 tablet, Rfl: 1   Omega-3 1000 MG CAPS, Take 1 capsule by mouth every morning. (Patient  not taking: Reported on 06/29/2022), Disp: , Rfl:    pantoprazole (PROTONIX) 40 MG tablet, Take 1 tablet (40 mg total) by mouth daily., Disp: 90 tablet, Rfl: 4   predniSONE (DELTASONE) 20 MG tablet, Take 75m PO daily x 2 days, then432mPO daily x 2 days, then 2084mO daily x 3 days, Disp: 13 tablet, Rfl: 0   pregabalin (LYRICA) 50 MG capsule, Take by mouth. (Patient not taking: Reported on 06/29/2022), Disp: , Rfl:   Allergies  Allergen Reactions   Carisoprodol-Aspirin-Codeine Hives and Itching   Carisoprodol Dermatitis   Percocet  [Oxycodone-Acetaminophen] Dermatitis   Gluten Meal Hives and Nausea Only    I personally reviewed {Reviewed:14835} with the patient/caregiver today.   ROS    Objective  There were no vitals filed for this visit.  There is no height or weight on file to  calculate BMI.  Physical Exam   No results found for this or any previous visit (from the past 2160 hour(s)).   PHQ2/9:    06/29/2022    9:52 AM 06/22/2022    1:11 PM 02/23/2022    8:20 AM 02/03/2022    8:33 AM 12/25/2021    4:50 PM  Depression screen PHQ 2/9  Decreased Interest 1 1 2 2 2   Down, Depressed, Hopeless 0 0 2 3 2   PHQ - 2 Score 1 1 4 5 4   Altered sleeping 3 3 3 2 3   Tired, decreased energy 0 3 3 2 3   Change in appetite 1 2 2  0 0  Feeling bad or failure about yourself  0 0 2 0 1  Trouble concentrating 2 0 2 0 1  Moving slowly or fidgety/restless 2 0 0 0 0  Suicidal thoughts 0 0 0 0 0  PHQ-9 Score 9 9 16 9 12   Difficult doing work/chores Somewhat difficult Somewhat difficult Not difficult at all Somewhat difficult Somewhat difficult      Fall Risk:    06/29/2022    9:51 AM 06/22/2022    1:11 PM 02/23/2022    8:20 AM 02/03/2022    8:27 AM 12/09/2020    2:49 PM  [place] the past year? 0 0 1 1 0  Number falls in past yr: 0 0 0 0 0  Injury with Fall? 0 0 1 0 0  Risk for fall due to : No Fall Risks No Fall Risks History of fall(s) Impaired balance/gait   Follow up Falls evaluation completed Falls evaluation completed Falls evaluation completed Falls evaluation completed;Education provided;Falls prevention discussed       Functional Status Survey:      Assessment & Plan

## 2022-10-14 NOTE — Telephone Encounter (Signed)
Spoke with patient, asked that she bring in her summons for jury duty so that [name] can have all the information that she needs.

## 2022-10-14 NOTE — Telephone Encounter (Signed)
PT had an appointment seeing another provider because she is sick.  Gave Jury Summons to be filled out by her provider.  Put in provider's folder.

## 2022-10-14 NOTE — Patient Instructions (Addendum)
Stacy Alberts, DO Allergy & Immunology   [facility] office: [place] office: [phone]  I have sent in a script for your new antihistamine, Please stop the Xyzal and start the Zyrtec at this time

## 2022-10-15 DIAGNOSIS — M533 Sacrococcygeal disorders, not elsewhere classified: Secondary | ICD-10-CM | POA: Diagnosis not present

## 2022-10-15 DIAGNOSIS — G8929 Other chronic pain: Secondary | ICD-10-CM | POA: Diagnosis not present

## 2022-10-15 DIAGNOSIS — Q7962 Hypermobile Ehlers-Danlos syndrome: Secondary | ICD-10-CM | POA: Diagnosis not present

## 2022-10-15 NOTE — Assessment & Plan Note (Signed)
Chronic, ongoing concern Patient states she is having cough, runny nose, and temperature fluctuations which she has attributed to a flare of her mast cell activation in the past She is taking Xyzal, Singulair and pepcid for management at this time and wishes for both of her antihistamines to be changed to "something stronger"  Offered to send in script for Zyrtec to replace Xyzal but that Singulair is most potent antihistamine that I am aware of and anything stronger would need consultation with Allergy specialty  Recommend she reach out to Allergy specialty for assistance with current flare as they will likely have better insight into a better antihistamine regimen - patient sent Mychart message to specialty during apt, will defer to their recommendations

## 2022-10-18 ENCOUNTER — Encounter: Payer: Self-pay | Admitting: Nurse Practitioner

## 2022-10-21 DIAGNOSIS — M5412 Radiculopathy, cervical region: Secondary | ICD-10-CM | POA: Diagnosis not present

## 2022-10-21 DIAGNOSIS — M9901 Segmental and somatic dysfunction of cervical region: Secondary | ICD-10-CM | POA: Diagnosis not present

## 2022-10-21 DIAGNOSIS — R519 Headache, unspecified: Secondary | ICD-10-CM | POA: Diagnosis not present

## 2022-10-21 DIAGNOSIS — M9902 Segmental and somatic dysfunction of thoracic region: Secondary | ICD-10-CM | POA: Diagnosis not present

## 2022-11-09 ENCOUNTER — Encounter: Payer: Self-pay | Admitting: Nurse Practitioner

## 2022-11-18 ENCOUNTER — Other Ambulatory Visit: Payer: Self-pay | Admitting: Nurse Practitioner

## 2022-11-18 NOTE — Telephone Encounter (Signed)
Requested Prescriptions  Pending Prescriptions Disp Refills   famotidine (PEPCID) 20 MG tablet [Pharmacy Med Name: FAMOTIDINE 20 MG TABLET] 180 tablet 0    Sig: TAKE 1 TABLET BY MOUTH TWICE A DAY     Gastroenterology:  H2 Antagonists Passed - 11/18/2022  1:44 AM      Passed - Valid encounter within last 12 months    Recent Outpatient Visits           1 month ago 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[place])   [facility] Mecum, Erin E, PA-C   4 months ago Dermatitis due to unknown cause   [employer], Erin E, PA-C   4 months ago Urinary symptom or sign   [employer], Donnelly T, NP   8 months ago Acute non-recurrent frontal sinusitis   [employer], Megan P, DO   8 months ago Allergic rhinitis, unspecified seasonality, unspecified trigger   [facility], Malachy Mood, NP               montelukast (SINGULAIR) 10 MG tablet [Pharmacy Med Name: MONTELUKAST SOD 10 MG TABLET] 90 tablet 1    Sig: TAKE 1 TABLET BY MOUTH EVERYDAY AT BEDTIME     Pulmonology:  Leukotriene Inhibitors Passed - 11/18/2022  1:44 AM      Passed - Valid encounter within last 12 months    Recent Outpatient Visits           1 month ago 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[place])   [facility] Mecum, Erin E, PA-C   4 months ago Dermatitis due to unknown cause   [employer], Erin E, PA-C   4 months ago Urinary symptom or sign   [employer], [place] T, NP   8 months ago Acute non-recurrent frontal sinusitis   [employer], Megan P, DO   8 months ago Allergic rhinitis, unspecified seasonality, unspecified trigger   [facility] Kathrine Haddock, NP

## 2022-12-21 ENCOUNTER — Ambulatory Visit (INDEPENDENT_AMBULATORY_CARE_PROVIDER_SITE_OTHER): Payer: 59

## 2022-12-21 ENCOUNTER — Ambulatory Visit
Admission: EM | Admit: 2022-12-21 | Discharge: 2022-12-21 | Disposition: A | Discharge status: Home / Self Care | Payer: 59

## 2022-12-21 DIAGNOSIS — M25579 Pain in unspecified ankle and joints of unspecified foot: Secondary | ICD-10-CM | POA: Insufficient documentation

## 2022-12-21 DIAGNOSIS — S92514A Nondisplaced fracture of proximal phalanx of right lesser toe(s), initial encounter for closed fracture: Secondary | ICD-10-CM

## 2022-12-21 DIAGNOSIS — M25571 Pain in right ankle and joints of right foot: Secondary | ICD-10-CM | POA: Diagnosis not present

## 2022-12-21 DIAGNOSIS — S92511A Displaced fracture of proximal phalanx of right lesser toe(s), initial encounter for closed fracture: Secondary | ICD-10-CM | POA: Diagnosis not present

## 2022-12-21 NOTE — ED Provider Notes (Signed)
Stacy West    CSN: [id number] Arrival date & time: 12/21/22  0902      History   Chief Complaint Chief Complaint  Patient presents with   Foot Injury    I think fractured foot - Entered by patient    HPI Stacy West is a 42 y.o. female.  Patient presents with pain, swelling, bruising of her right foot after she hit her foot on her couch yesterday.  Treatment with elevation and ice packs.  She denies numbness, weakness, open wounds, or other symptoms.  She reports history of broken toes and other foot injuries that occur while performing martial arts.    The history is provided by the patient and medical records.    Past Medical History:  Diagnosis Date   Abnormal Pap smear of cervix    Allergy    Depression    Ehlers-Danlos disease    Fibromyalgia    Heart murmur    Insulin resistance    Miscarriage    Other cervical disc degeneration at C4-C5 level    PCOS (polycystic ovarian syndrome)    PCOS (polycystic ovarian syndrome)    Premature ovarian failure    Recurrent upper respiratory infection (URI)    SI (sacroiliac) joint dysfunction    Sleep apnea    Thyroid disease     Patient Active Problem List   Diagnosis Date Noted   Pain in joint, ankle and foot 12/21/2022   Pain in left lower leg 06/22/2022   Other allergic rhinitis 04/26/2022   Chronic cough 04/26/2022   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[place]) 02/23/2022   Urinary symptom or sign 01/06/2022   Chronic neck pain 12/20/2021   Chronic pain syndrome 12/20/2021   Fibromyalgia 12/20/2021   Sinus tachycardia 08/17/2021   Gastroesophageal reflux disease 05/07/2021   Moderate episode of recurrent major depressive disorder ([place]) 01/15/2021   Celiac disease 01/31/2020   Right ovarian cyst 08/15/2019   Cervical spondylosis without myelopathy 07/04/2019   MVP (mitral valve prolapse) 05/31/2019   Ehlers-Danlos syndrome type III 05/17/2019   PCOS (polycystic ovarian syndrome) 05/17/2019   Insulin resistance  10/21/2016   Chronic fatigue syndrome with fibromyalgia 01/31/2007   Degenerative disc disease at L5-S1 level 01/31/2007    Past Surgical History:  Procedure Laterality Date   ADENOIDECTOMY     BACK SURGERY     COLPOSCOPY     FINGER SURGERY Left    NECK SURGERY  07/2020   TONSILLECTOMY     WISDOM TOOTH EXTRACTION      OB History     Gravida  4   Para  1   Term  1   Preterm      AB  2   Living  1      SAB  1   IAB  1   Ectopic      Multiple      Live Births  1            Home Medications    Prior to Admission medications   Medication Sig Start Date End Date Taking? Authorizing Provider  DULoxetine (CYMBALTA) 20 MG capsule Take 20 mg by mouth daily. 11/02/22  Yes [provider]  glipiZIDE (GLUCOTROL) 5 MG tablet Take 5 mg by mouth every morning. 09/01/22  Yes [provider]  hydrOXYzine (ATARAX) 10 MG tablet Take 5-10 mg by mouth daily as needed. 11/28/22  Yes [provider]  traZODone (DESYREL) 50 MG tablet Take 25-50 mg by mouth at  bedtime. 11/29/22  Yes [provider]  cetirizine (ZYRTEC) 10 MG tablet Take 1 tablet (10 mg total) by mouth daily. 10/14/22   Mecum, Erin E, PA-C  cyclobenzaprine (FLEXERIL) 10 MG tablet Take 1 tablet (10 mg total) by mouth 3 (three) times daily as needed for muscle spasms. 01/06/22   Cannady, Henrine Screws T, NP  famotidine (PEPCID) 20 MG tablet TAKE 1 TABLET BY MOUTH TWICE A DAY 11/18/22   Cannady, [name] T, NP  FIBER SELECT GUMMIES PO Take by mouth.    [provider]  medroxyPROGESTERone (PROVERA) 10 MG tablet TAKE 2 TABLETS (20 MG TOTAL) BY MOUTH DAILY. USE FOR [place] Patient not taking: Reported on 06/29/2022 12/17/21   Rubie Maid, MD  metFORMIN (GLUCOPHAGE-XR) 500 MG 24 hr tablet Take 2 tablets by mouth 2 (two) times daily. 04/23/22   [provider]  metoprolol succinate (TOPROL-XL) 25 MG 24 hr tablet Take 25 mg by mouth daily. 01/30/22   [provider]   montelukast (SINGULAIR) 10 MG tablet TAKE 1 TABLET BY MOUTH EVERYDAY AT BEDTIME 11/18/22   Cannady, [name] T, NP  Omega-3 1000 MG CAPS Take 1 capsule by mouth every morning.    [provider]  pantoprazole (PROTONIX) 40 MG tablet Take 1 tablet (40 mg total) by mouth daily. 06/22/22   Cannady, Henrine Screws T, NP  pregabalin (LYRICA) 50 MG capsule Take by mouth.    [provider]    Family History Family History  Problem Relation Age of Onset   Heart murmur Mother    Asthma Mother    Skin cancer Father    COPD Father    Bipolar disorder Brother    Depression Brother    Alcoholism Brother    Depression Brother    Heart murmur Maternal Aunt    Breast cancer Maternal Aunt    Early menopause Maternal Aunt    Asthma Maternal Grandmother     Social History Social History   Tobacco Use   Smoking status: Former    Types: Cigarettes    Quit date: 2018    Years since quitting: 6.1   Smokeless tobacco: Never  Vaping Use   Vaping Use: Never used  Substance Use Topics   Alcohol use: Not Currently    Comment: occ   Drug use: Not Currently     Allergies   Carisoprodol-aspirin-codeine, Carisoprodol, Percocet  [oxycodone-acetaminophen], and Gluten meal   Review of Systems Review of Systems  Constitutional:  Negative for fever.  Musculoskeletal:  Positive for arthralgias, gait problem and joint swelling.  Skin:  Positive for color change. Negative for wound.  Neurological:  Negative for weakness and numbness.  All other systems reviewed and are negative.    Physical Exam Triage Vital Signs ED Triage Vitals  Enc Vitals Group     BP 12/21/22 0949 106/77     Pulse Rate 12/21/22 0938 94     Resp 12/21/22 0938 18     Temp 12/21/22 0938 97.8 F (36.6 C)     Temp src --      SpO2 12/21/22 0938 99 %     Weight --      Height --      Head Circumference --      Peak Flow --      Pain Score 12/21/22 0945 10     Pain Loc --      Pain Edu? --      Excl. in [place]? --  No data found.  Updated Vital Signs BP 106/77   Pulse 94   Temp 97.8 F (36.6 C)   Resp 18   SpO2 99%   Visual Acuity Right Eye Distance:   Left Eye Distance:   Bilateral Distance:    Right Eye Near:   Left Eye Near:    Bilateral Near:     Physical Exam Vitals and nursing note reviewed.  Constitutional:      General: She is not in acute distress.    Appearance: Normal appearance. She is well-developed. She is not ill-appearing.  HENT:     Mouth/Throat:     Mouth: Mucous membranes are moist.  Cardiovascular:     Rate and Rhythm: Normal rate and regular rhythm.  Pulmonary:     Effort: Pulmonary effort is normal. No respiratory distress.  Musculoskeletal:        General: Swelling and tenderness present. No deformity. Normal range of motion.     Cervical back: Neck supple.       Feet:     Comments: Right foot/ankle: FROM, sensation intact, strength 5/5, 2+ pedal pulse, brisk capillary refill.   Skin:    General: Skin is warm and dry.     Capillary Refill: Capillary refill takes less than 2 seconds.     Findings: Bruising present. No lesion.  Neurological:     General: No focal deficit present.     Mental Status: She is alert and oriented to person, place, and time.     Sensory: No sensory deficit.     Motor: No weakness.     Gait: Gait abnormal.  Psychiatric:        Mood and Affect: Mood normal.        Behavior: Behavior normal.      UC Treatments / Results  Labs (all labs ordered are listed, but only abnormal results are displayed) Labs Reviewed - No data to display  EKG   Radiology DG Foot Complete Right  Result Date: 12/21/2022 CLINICAL DATA:  Stubbed toes on couch yesterday, pain in the foot persists EXAM: RIGHT FOOT COMPLETE - 3+ VIEW COMPARISON:  None Available. FINDINGS: Nondisplaced transverse fracture through the proximal metaphysis of the proximal phalanx of the second toe. Small degenerative subcortical cyst along the base of the distal  phalanx great toe. Suboptimal characterization of the Lisfranc joint based on obliquity, but no overt malalignment is seen. Mildly exaggerated longitudinal arch of the foot is nonspecific on today's non weight-bearing view. Small plantar calcaneal spur. IMPRESSION: 1. Nondisplaced acute transverse fracture through the proximal metaphysis of the proximal phalanx of the second toe. 2. Mildly exaggerated longitudinal arch of the foot is nonspecific on today's non weight-bearing view. 3. Small plantar calcaneal spur. Electronically Signed   By: Van Clines M.D.   On: 12/21/2022 10:07    Procedures Procedures (including critical care time)  Medications Ordered in UC Medications - No data to display  Initial Impression / Assessment and Plan / UC Course  I have reviewed the triage vital signs and the nursing notes.  Pertinent labs & imaging results that were available during my care of the patient were reviewed by me and considered in my medical decision making (see chart for details).    Closed nondisplaced fracture of proximal phalanx of right second toe.  X-ray shows "1. Nondisplaced acute transverse fracture through the proximal metaphysis of the proximal phalanx of the second toe. 2. Mildly exaggerated longitudinal arch of the foot is nonspecific on today's  non weight-bearing view. 3. Small plantar calcaneal spur."  Treating with postop shoe, crutches, rest, elevation, ice pack, Tylenol or ibuprofen.  Instructed patient to follow-up with an orthopedist.  Contact information for on-call Ortho provided.  Education provided on toe fracture.  Patient agrees to plan of care.     Final Clinical Impressions(s) / UC Diagnoses   Final diagnoses:  Pain in joint involving right ankle and foot  Closed nondisplaced fracture of proximal phalanx of lesser toe of right foot, initial encounter     Discharge Instructions      Take ibuprofen as directed.  Rest and elevate your foot.  Apply ice packs  2-3 times a day for up to 15 minutes each.  Wear the post-op shoe and use the crutches.    Follow up with an orthopedist such as the one listed below.        ED Prescriptions   None    PDMP not reviewed this encounter.   Sharion Balloon, NP 12/21/22 1036

## 2022-12-21 NOTE — Discharge Instructions (Addendum)
Take ibuprofen as directed.  Rest and elevate your foot.  Apply ice packs 2-3 times a day for up to 15 minutes each.  Wear the post-op shoe and use the crutches.    Follow up with an orthopedist such as the one listed below.

## 2022-12-21 NOTE — Progress Notes (Unsigned)
   I, Peterson Lombard, LAT, ATC acting as a scribe for Lynne Leader, MD.  Subjective:    CC: R foot pain  HPI: Pt is a 42 y/o female c/o R foot pain. On 2/5, hit her foot on the couch leg. Pt was seen at the [facility] 2/6 w/ XR results revealing a nondisplaced transverse fracture through the proximal metaphysis of the proximal phalanx of the 2nd toe. Pt was provided a post-op shoe and crutches. Today, pt reports  R foot swelling: yes- w/ bruising Treatments tried:  Dx imaging: 12/21/22 R foot XR.  Pertinent review of Systems: ***  Relevant historical information: ***   Objective:   There were no vitals filed for this visit. General: Well Developed, well nourished, and in no acute distress.   MSK: ***  Lab and Radiology Results No results found for this or any previous visit (from the past 72 hour(s)). DG Foot Complete Right  Result Date: 12/21/2022 CLINICAL DATA:  Stubbed toes on couch yesterday, pain in the foot persists EXAM: RIGHT FOOT COMPLETE - 3+ VIEW COMPARISON:  None Available. FINDINGS: Nondisplaced transverse fracture through the proximal metaphysis of the proximal phalanx of the second toe. Small degenerative subcortical cyst along the base of the distal phalanx great toe. Suboptimal characterization of the Lisfranc joint based on obliquity, but no overt malalignment is seen. Mildly exaggerated longitudinal arch of the foot is nonspecific on today's non weight-bearing view. Small plantar calcaneal spur. IMPRESSION: 1. Nondisplaced acute transverse fracture through the proximal metaphysis of the proximal phalanx of the second toe. 2. Mildly exaggerated longitudinal arch of the foot is nonspecific on today's non weight-bearing view. 3. Small plantar calcaneal spur. Electronically Signed   By: Van Clines M.D.   On: 12/21/2022 10:07      Impression and Recommendations:    Assessment and Plan: 42 y.o. female with ***.  PDMP not reviewed this encounter. No  orders of the defined types were placed in this encounter.  No orders of the defined types were placed in this encounter.   Discussed warning signs or symptoms. Please see discharge instructions. Patient expresses understanding.   ***

## 2022-12-21 NOTE — ED Triage Notes (Signed)
Patient to Urgent Care with complaints of right sided foot pain following an injury. Reports she stubbed her toes on her couch yesterday, now has bruising developing across her toes and foot.   Difficult to ambulate on foot d/t to pain. 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

## 2022-12-23 ENCOUNTER — Ambulatory Visit (INDEPENDENT_AMBULATORY_CARE_PROVIDER_SITE_OTHER): Payer: 59 | Admitting: Family Medicine

## 2022-12-23 ENCOUNTER — Encounter: Payer: Self-pay | Admitting: Family Medicine

## 2022-12-23 VITALS — BP 116/78 | HR 75 | Ht 68.5 in | Wt 216.0 lb

## 2022-12-23 DIAGNOSIS — S92514A Nondisplaced fracture of proximal phalanx of right lesser toe(s), initial encounter for closed fracture: Secondary | ICD-10-CM

## 2022-12-23 DIAGNOSIS — M797 Fibromyalgia: Secondary | ICD-10-CM

## 2022-12-23 DIAGNOSIS — Q7962 Hypermobile Ehlers-Danlos syndrome: Secondary | ICD-10-CM | POA: Diagnosis not present

## 2022-12-23 MED ORDER — PREGABALIN 50 MG PO CAPS: 50.0000 mg | ORAL_CAPSULE | Freq: Every day | ORAL | 2 refills | Status: DC

## 2022-12-23 NOTE — Patient Instructions (Addendum)
Thank you for coming in today.   Continue wearing the post-op shoe and using the crutches  Recheck in about 2 weeks. We will repeat the xray then.   For your EDS. Core and joint strength are going to be a big asset and this injury is going to screw up your symmetry and alignment and strength and you may need some PT.   [facility]

## 2022-12-24 ENCOUNTER — Encounter: Payer: Self-pay | Admitting: Family Medicine

## 2023-01-05 NOTE — Progress Notes (Unsigned)
   I, Peterson Lombard, LAT, ATC acting as a scribe for Stacy Leader, MD.  Stacy West is a 42 y.o. female who presents to [facility] today for follow-up right second toe fracture.  On 2/5, hit her foot on the couch leg. Pt was seen at the [place] 2/6.  Patient was last seen by Dr. Georgina Snell on 12/23/2022 and was advised to continue postop shoe, limited weightbearing, advancing activity as tolerated.  Patient was also prescribed Lyrica for her fibromyalgia.  Today, patient reports her R foot is feeling about the same. Pt is not wearing the post-op shoe because it "added weight" and "people were knocking into it more." Pt had been wearing a "hard sole shoes." Pt has not picked up her rx for Lyrica.   Dx imaging: 12/21/22 R foot XR   Pertinent review of systems: No fevers or chills  Relevant historical information: Ehlers-Danlos syndrome   Exam:  BP 118/76   Pulse 80   Ht 5' 8.5" (1.74 m)   SpO2 99%   BMI 32.36 kg/m  General: Well Developed, well nourished, and in no acute distress.   MSK: Right foot normal motion.  Normal gait.    Lab and Radiology Results  X-ray images right foot obtained today personally and independently interpreted. Stable appearing fracture at the proximal aspect of the proximal phalanx second toe.  No change in alignment or displacement.  No significant callus formation yet. Await formal radiology review    Assessment and Plan: 42 y.o. female with right second toe fracture.  Stable.  She is not immobilizing her foot much with the postop shoe as it is uncomfortable.  She could try a turf toe insole.  Recommend continued buddy taping.  Recheck in 1 month.   PDMP not reviewed this encounter. Orders Placed This Encounter  Procedures   DG Foot Complete Right    Standing Status:   Future    Number of Occurrences:   1    Standing Expiration Date:   01/06/2024    Order Specific Question:   Reason for Exam (SYMPTOM  OR  DIAGNOSIS REQUIRED)    Answer:   right foot pain    Order Specific Question:   Preferred imaging location?    Answer:   [facility]    Order Specific Question:   Is patient pregnant?    Answer:   No   No orders of the defined types were placed in this encounter.    Discussed warning signs or symptoms. Please see discharge instructions. Patient expresses understanding.   The above documentation has been reviewed and is accurate and complete Stacy West, M.D.

## 2023-01-06 ENCOUNTER — Ambulatory Visit (INDEPENDENT_AMBULATORY_CARE_PROVIDER_SITE_OTHER): Payer: 59

## 2023-01-06 ENCOUNTER — Ambulatory Visit (INDEPENDENT_AMBULATORY_CARE_PROVIDER_SITE_OTHER): Payer: 59 | Admitting: Family Medicine

## 2023-01-06 VITALS — BP 118/76 | HR 80 | Ht 68.5 in

## 2023-01-06 DIAGNOSIS — S92514A Nondisplaced fracture of proximal phalanx of right lesser toe(s), initial encounter for closed fracture: Secondary | ICD-10-CM

## 2023-01-06 DIAGNOSIS — S92514D Nondisplaced fracture of proximal phalanx of right lesser toe(s), subsequent encounter for fracture with routine healing: Secondary | ICD-10-CM

## 2023-01-06 DIAGNOSIS — S92511A Displaced fracture of proximal phalanx of right lesser toe(s), initial encounter for closed fracture: Secondary | ICD-10-CM | POA: Diagnosis not present

## 2023-01-06 NOTE — Patient Instructions (Addendum)
Thank you for coming in today.   Buddy tape the toes.   Consider a turf toe insole.   Get a Steel Turf Toe insole.  Do a Producer, television/film/video for [employer].      Recheck in about 1 month.

## 2023-01-07 NOTE — Progress Notes (Signed)
Right foot x-ray shows the fracture looks stable.

## 2023-01-09 ENCOUNTER — Other Ambulatory Visit: Payer: Self-pay | Admitting: Physician Assistant

## 2023-01-09 DIAGNOSIS — D8940 Mast cell activation, unspecified: Secondary | ICD-10-CM

## 2023-01-10 NOTE — Telephone Encounter (Signed)
Requested Prescriptions  Pending Prescriptions Disp Refills   cetirizine (ZYRTEC) 10 MG tablet [Pharmacy Med Name: CETIRIZINE HCL 10 MG TABLET] 90 tablet 0    Sig: TAKE 1 TABLET BY MOUTH EVERY DAY     Ear, Nose, and Throat:  Antihistamines 2 Failed - 01/09/2023  8:50 AM      Failed - Cr in normal range and within 360 days    Creatinine, Ser  Date Value Ref Range Status  12/25/2021 0.75 0.57 - 1.00 mg/dL Final         Passed - Valid encounter within last 12 months    Recent Outpatient Visits           2 months ago 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[place])   [place] Mecum, Erin E, PA-C   6 months ago Dermatitis due to unknown cause   [place] [facility] Mecum, Erin E, PA-C   6 months ago Urinary symptom or sign   [place], [place] T, NP   10 months ago Acute non-recurrent frontal sinusitis   [place] [facility] [place], Megan P, DO   10 months ago Allergic rhinitis, unspecified seasonality, unspecified trigger   [place] Kathrine Haddock, NP

## 2023-01-19 ENCOUNTER — Telehealth: Payer: Self-pay | Admitting: Nurse Practitioner

## 2023-01-19 NOTE — Telephone Encounter (Signed)
Contacted Stacy West to schedule their annual wellness visit. Appointment made for 02/08/2023.  [place]; Care Guide Ambulatory [facility] Direct Dial: [phone]

## 2023-02-08 ENCOUNTER — Ambulatory Visit (INDEPENDENT_AMBULATORY_CARE_PROVIDER_SITE_OTHER): Payer: 59

## 2023-02-08 VITALS — Ht 68.5 in | Wt 216.0 lb

## 2023-02-08 DIAGNOSIS — Z0000 Encounter for general adult medical examination without abnormal findings: Secondary | ICD-10-CM | POA: Diagnosis not present

## 2023-02-08 NOTE — Progress Notes (Signed)
I connected with  Stacy West on 02/08/23 by a audio enabled telemedicine application and verified that I am speaking with the correct person using two identifiers.  Patient Location: Home  Provider Location: Office/Clinic  I discussed the limitations of evaluation and management by telemedicine. The patient expressed understanding and agreed to proceed.  Subjective:   Stacy West is a 42 y.o. female who presents for Medicare Annual (Subsequent) preventive examination.  Review of Systems     Cardiac Risk Factors include: family history of premature cardiovascular disease     Objective:    There were no vitals filed for this visit. There is no height or weight on file to calculate BMI.     02/08/2023    3:32 PM 12/21/2022    9:49 AM 02/03/2022    8:29 AM 05/06/2020    7:55 AM 01/30/2020    9:15 AM 01/17/2020   11:32 AM 05/25/2019   12:44 PM  Advanced Directives  Does Patient Have a Medical Advance Directive? No No No No No No No  Would patient like information on creating a medical advance directive? No - Patient declined  No - Patient declined No - Patient declined No - Patient declined No - Patient declined No - Patient declined    Current Medications (verified) Outpatient Encounter Medications as of 02/08/2023  Medication Sig   cetirizine (ZYRTEC) 10 MG tablet TAKE 1 TABLET BY MOUTH EVERY DAY   cyclobenzaprine (FLEXERIL) 10 MG tablet Take 1 tablet (10 mg total) by mouth 3 (three) times daily as needed for muscle spasms.   DULoxetine (CYMBALTA) 20 MG capsule Take 20 mg by mouth daily.   famotidine (PEPCID) 20 MG tablet TAKE 1 TABLET BY MOUTH TWICE A DAY   medroxyPROGESTERone (PROVERA) 10 MG tablet TAKE 2 TABLETS (20 MG TOTAL) BY MOUTH DAILY. USE FOR 14 DAYS EACH MONTH   metFORMIN (GLUCOPHAGE-XR) 500 MG 24 hr tablet Take 2 tablets by mouth 2 (two) times daily.   metoprolol succinate (TOPROL-XL) 25 MG 24 hr tablet Take 25 mg by mouth daily.   montelukast (SINGULAIR) 10 MG  tablet TAKE 1 TABLET BY MOUTH EVERYDAY AT BEDTIME   Omega-3 1000 MG CAPS Take 1 capsule by mouth every morning.   pantoprazole (PROTONIX) 40 MG tablet Take 1 tablet (40 mg total) by mouth daily.   pregabalin (LYRICA) 50 MG capsule Take 1 capsule (50 mg total) by mouth at bedtime.   FIBER SELECT GUMMIES PO Take by mouth. (Patient not taking: Reported on 02/08/2023)   glipiZIDE (GLUCOTROL) 5 MG tablet Take 5 mg by mouth every morning. (Patient not taking: Reported on 02/08/2023)   hydrOXYzine (ATARAX) 10 MG tablet Take 5-10 mg by mouth daily as needed.   traZODone (DESYREL) 50 MG tablet Take 25-50 mg by mouth at bedtime. (Patient not taking: Reported on 02/08/2023)   No facility-administered encounter medications on file as of 02/08/2023.    Allergies (verified) Carisoprodol-aspirin-codeine, Carisoprodol, Percocet  [oxycodone-acetaminophen], and Gluten meal   History: Past Medical History:  Diagnosis Date   Abnormal Pap smear of cervix    Allergy    Depression    Ehlers-Danlos disease    Fibromyalgia    Heart murmur    Insulin resistance    Miscarriage    Other cervical disc degeneration at C4-C5 level    PCOS (polycystic ovarian syndrome)    PCOS (polycystic ovarian syndrome)    Premature ovarian failure    Recurrent upper respiratory infection (URI)    SI (sacroiliac) joint  dysfunction    Sleep apnea    Thyroid disease    Past Surgical History:  Procedure Laterality Date   ADENOIDECTOMY     BACK SURGERY     COLPOSCOPY     FINGER SURGERY Left    NECK SURGERY  07/2020   TONSILLECTOMY     WISDOM TOOTH EXTRACTION     Family History  Problem Relation Age of Onset   Heart murmur Mother    Asthma Mother    Skin cancer Father    COPD Father    Bipolar disorder Brother    Depression Brother    Alcoholism Brother    Depression Brother    Heart murmur Maternal Aunt    Breast cancer Maternal Aunt    Early menopause Maternal Aunt    Asthma Maternal Grandmother    Social  History   Socioeconomic History   Marital status: Married    Spouse name: Not on file   Number of children: 1   Years of education: Not on file   Highest education level: Not on file  Occupational History   Not on file  Tobacco Use   Smoking status: Former    Types: Cigarettes    Quit date: 2018    Years since quitting: 6.2   Smokeless tobacco: Never  Vaping Use   Vaping Use: Never used  Substance and Sexual Activity   Alcohol use: Not Currently    Comment: occ   Drug use: Not Currently   Sexual activity: Yes    Birth control/protection: None  Other Topics Concern   Not on file  Social History Narrative   Not on file   Social Determinants of Health   Financial Resource Strain: Low Risk  (02/08/2023)   Overall Financial Resource Strain (CARDIA)    Difficulty of Paying Living Expenses: Not hard at all  Food Insecurity: No Food Insecurity (02/08/2023)   Hunger Vital Sign    Worried About Running Out of Food in the Last Year: Never true    [place] in the Last Year: Never true  Transportation Needs: No Transportation Needs (02/08/2023)   PRAPARE - Hydrologist (Medical): No    Lack of Transportation (Non-Medical): No  Physical Activity: Sufficiently Active (02/08/2023)   Exercise Vital Sign    Days of Exercise per Week: 7 days    Minutes of Exercise per Session: 30 min  Stress: No Stress Concern Present (02/08/2023)   [place]    Feeling of Stress : Only a little  Social Connections: Socially Isolated (02/08/2023)   Social Connection and Isolation Panel [NHANES]    Frequency of Communication with Friends and Family: Never    Frequency of Social Gatherings with Friends and Family: Never    Attends Religious Services: Never    Printmaker: No    Attends Music therapist: Never    Marital Status: Married    Tobacco  Counseling Counseling given: Not Answered   Clinical Intake:  Pre-visit preparation completed: Yes  Pain : No/denies pain     Nutritional Risks: None Diabetes: No  How often do you need to have someone help you when you read instructions, pamphlets, or other written materials from your doctor or pharmacy?: 1 - Never  Diabetic?no  Interpreter Needed?: No  Information entered by :: Kirke Shaggy, LPN   Activities of Daily Living    02/08/2023  3:33 PM  In your present state of health, do you have any difficulty performing the following activities:  Hearing? 0  Vision? 0  Difficulty concentrating or making decisions? 0  Walking or climbing stairs? 1  Dressing or bathing? 0  Doing errands, shopping? 1  Preparing Food and eating ? N  Using the Toilet? N  In the past six months, have you accidently leaked urine? N  Do you have problems with loss of bowel control? N  Managing your Medications? N  Managing your Finances? N  Housekeeping or managing your Housekeeping? Y    Patient Care Team: Venita Lick, NP as PCP - General (Nurse Practitioner)  Indicate any recent Medical Services you may have received from other than [facility] providers in the past year (date may be approximate).     Assessment:   This is a routine wellness examination for Stacy West.  Hearing/Vision screen Hearing Screening - Comments:: No aids Vision Screening - Comments:: No glasses  Dietary issues and exercise activities discussed: Current Exercise Habits: Home exercise routine, Type of exercise: walking, Time (Minutes): 30, Frequency (Times/Week): 7, Weekly Exercise (Minutes/Week): 210, Intensity: Mild   Goals Addressed             This Visit's Progress    DIET - EAT MORE FRUITS AND VEGETABLES         Depression Screen    02/08/2023    3:28 PM 10/14/2022    2:02 PM 06/29/2022    9:52 AM 06/22/2022    1:11 PM 02/23/2022    8:20 AM 02/03/2022    8:33 AM 12/25/2021    4:50 PM  PHQ 2/9  Scores  PHQ - 2 Score 1 5 1 1 4 5 4   PHQ- 9 Score 4 19 9 9 16 9 12     Fall Risk    02/08/2023    3:33 PM 06/29/2022    9:51 AM 06/22/2022    1:11 PM 02/23/2022    8:20 AM 02/03/2022    8:27 AM  [place] in the past year? 0 0 0 1 1  Number falls in past yr: 0 0 0 0 0  Injury with Fall? 0 0 0 1 0  Risk for fall due to : No Fall Risks No Fall Risks No Fall Risks History of fall(s) Impaired balance/gait  Follow up Falls prevention discussed;Falls evaluation completed Falls evaluation completed Falls evaluation completed Falls evaluation completed Falls evaluation completed;Education provided;Falls prevention discussed    FALL RISK PREVENTION PERTAINING TO THE HOME:  Any stairs in or around the home? Yes  If so, are there any without handrails? No  Home free of loose throw rugs in walkways, pet beds, electrical cords, etc? Yes  Adequate lighting in your home to reduce risk of falls? Yes   ASSISTIVE DEVICES UTILIZED TO PREVENT FALLS:  Life alert? No  Use of a cane, walker or w/c? No  Grab bars in the bathroom? No  Shower chair or bench in shower? Yes  Elevated toilet seat or a handicapped toilet? No    Cognitive Function:        02/08/2023    3:36 PM  6CIT Screen  What Year? 0 points  What month? 0 points  What time? 0 points  Count back from 20 0 points  Months in reverse 2 points  Repeat phrase 0 points  Total Score 2 points    Immunizations Immunization History  Administered Date(s) Administered   Influenza,inj,Quad PF,6+ Mos  07/19/2019   Influenza-Unspecified 07/19/2019   PFIZER(Purple Top)SARS-COV-2 Vaccination 02/01/2020, 02/21/2020, 08/05/2020   Tdap 06/29/2022    TDAP status: Up to date  Flu Vaccine status: Declined, Education has been provided regarding the importance of this vaccine but patient still declined. Advised may receive this vaccine at local pharmacy or Health Dept. Aware to provide a copy of the vaccination record if obtained from local  pharmacy or Health Dept. Verbalized acceptance and understanding.  Pneumococcal vaccine status: Declined,  Education has been provided regarding the importance of this vaccine but patient still declined. Advised may receive this vaccine at local pharmacy or Health Dept. Aware to provide a copy of the vaccination record if obtained from local pharmacy or Health Dept. Verbalized acceptance and understanding.   Covid-19 vaccine status: Completed vaccines  Qualifies for Shingles Vaccine? No   Zostavax completed No   Shingrix Completed?: No.    Education has been provided regarding the importance of this vaccine. Patient has been advised to call insurance company to determine out of pocket expense if they have not yet received this vaccine. Advised may also receive vaccine at local pharmacy or Health Dept. Verbalized acceptance and understanding.  Screening Tests Health Maintenance  Topic Date Due   Hepatitis C Screening  Never done   PAP SMEAR-Modifier  06/17/2022   COVID-19 Vaccine (4 - 2023-24 season) 07/16/2022   INFLUENZA VACCINE  02/13/2023 (Originally 06/15/2022)   Medicare Annual Wellness (AWV)  02/08/2024   DTaP/Tdap/Td (2 - Td or Tdap) 06/29/2032   HIV Screening  Completed   HPV VACCINES  Aged Out    Health Maintenance  Health Maintenance Due  Topic Date Due   Hepatitis C Screening  Never done   PAP SMEAR-Modifier  06/17/2022   COVID-19 Vaccine (4 - 2023-24 season) 07/16/2022    Lung Cancer Screening: (Low Dose CT Chest recommended if Age 48-80 years, 30 pack-year currently smoking OR have quit w/in 15years.) does not qualify.    Additional Screening:  Hepatitis C Screening: does qualify; Completed no  Vision Screening: Recommended annual ophthalmology exams for early detection of glaucoma and other disorders of the eye. Is the patient up to date with their annual eye exam?  No  Who is the provider or what is the name of the office in which the patient attends annual eye  exams? No one If pt is not established with a provider, would they like to be referred to a provider to establish care? No .   Dental Screening: Recommended annual dental exams for proper oral hygiene  Community Resource Referral / Chronic Care Management: CRR required this visit?  No   CCM required this visit?  No      Plan:     I have personally reviewed and noted the following in the patient's chart:   Medical and social history Use of alcohol, tobacco or illicit drugs  Current medications and supplements including opioid prescriptions. Patient is not currently taking opioid prescriptions. Functional ability and status Nutritional status Physical activity Advanced directives List of other physicians Hospitalizations, surgeries, and ER visits in previous 12 months Vitals Screenings to include cognitive, depression, and falls Referrals and appointments  In addition, I have reviewed and discussed with patient certain preventive protocols, quality metrics, and best practice recommendations. A written personalized care plan for preventive services as well as general preventive health recommendations were provided to patient.     Dionisio David, LPN   [id number]   Nurse Notes: none

## 2023-02-08 NOTE — Patient Instructions (Signed)
Stacy West , Thank you for taking time to come for your Medicare Wellness Visit. I appreciate your ongoing commitment to your health goals. Please review the following plan we discussed and let me know if I can assist you in the future.   These are the goals we discussed:  Goals      DIET - EAT MORE FRUITS AND VEGETABLES     Patient Stated     No goals  Get healthy        This is a list of the screening recommended for you and due dates:  Health Maintenance  Topic Date Due   Hepatitis C Screening: USPSTF Recommendation to screen - Ages 36-79 yo.  Never done   Pap Smear  06/17/2022   COVID-19 Vaccine (4 - 2023-24 season) 07/16/2022   Flu Shot  02/13/2023*   Medicare Annual Wellness Visit  02/08/2024   DTaP/Tdap/Td vaccine (2 - Td or Tdap) 06/29/2032   HIV Screening  Completed   HPV Vaccine  Aged Out  *Topic was postponed. The date shown is not the original due date.    Advanced directives: no  Conditions/risks identified: none  Next appointment: Follow up in one year for your annual wellness visit. 02/14/24 @ 1:30 pm by phone  Preventive Care 58-87 Years Old, Female Preventive care refers to lifestyle choices and visits with your health care provider that can promote health and wellness. Preventive care visits are also called wellness exams. What can I expect for my preventive care visit? Counseling During your preventive care visit, your health care provider may ask about your: Medical history, including: Past medical problems. Family medical history. Pregnancy history. Current health, including: Menstrual cycle. Method of birth control. Emotional well-being. Home life and relationship well-being. Sexual activity and sexual health. Lifestyle, including: Alcohol, nicotine or tobacco, and drug use. Access to firearms. Diet, exercise, and sleep habits. Work and work Statistician. Sunscreen use. Safety issues such as seatbelt and bike helmet use. Physical exam Your  health care provider may check your: Height and weight. These may be used to calculate your BMI (body mass index). BMI is a measurement that tells if you are at a healthy weight. Waist circumference. This measures the distance around your waistline. This measurement also tells if you are at a healthy weight and may help predict your risk of certain diseases, such as type 2 diabetes and high blood pressure. Heart rate and blood pressure. Body temperature. Skin for abnormal spots. What immunizations do I need? Vaccines are usually given at various ages, according to a schedule. Your health care provider will recommend vaccines for you based on your age, medical history, and lifestyle or other factors, such as travel or where you work. What tests do I need? Screening Your health care provider may recommend screening tests for certain conditions. This may include: Pelvic exam and Pap test. Lipid and cholesterol levels. Diabetes screening. This is done by checking your blood sugar (glucose) after you have not eaten for a while (fasting). Hepatitis B test. Hepatitis C test. HIV (human immunodeficiency virus) test. STI (sexually transmitted infection) testing, if you are at risk. BRCA-related cancer screening. This may be done if you have a family history of breast, ovarian, tubal, or peritoneal cancers. Talk with your health care provider about your test results, treatment options, and if necessary, the need for more tests. Follow these instructions at home: Eating and drinking  Eat a healthy diet that includes fresh fruits and vegetables, whole grains, lean protein,  and low-fat dairy products. Take vitamin and mineral supplements as recommended by your health care provider. Do not drink alcohol if: Your health care provider tells you not to drink. You are pregnant, may be pregnant, or are planning to become pregnant. If you drink alcohol: Limit how much you have to 0-1 drink a day. Know how  much alcohol is in your drink. In the [place] one drink equals one 12 oz bottle of beer (355 mL), one 5 oz glass of wine (148 mL), or one 1 oz glass of hard liquor (44 mL). Lifestyle Brush your teeth every morning and night with fluoride toothpaste. Floss one time each day. Exercise for at least 30 minutes 5 or more days each week. Do not use any products that contain nicotine or tobacco. These products include cigarettes, chewing tobacco, and vaping devices, such as e-cigarettes. If you need help quitting, ask your health care provider. Do not use drugs. If you are sexually active, practice safe sex. Use a condom or other form of protection to prevent STIs. If you do not wish to become pregnant, use a form of birth control. If you plan to become pregnant, see your health care provider for a prepregnancy visit. Find healthy ways to manage stress, such as: Meditation, yoga, or listening to music. Journaling. Talking to a trusted person. Spending time with friends and family. Minimize exposure to UV radiation to reduce your risk of skin cancer. Safety Always wear your seat belt while driving or riding in a vehicle. Do not drive: If you have been drinking alcohol. Do not ride with someone who has been drinking. If you have been using any mind-altering substances or drugs. While texting. When you are tired or distracted. Wear a helmet and other protective equipment during sports activities. If you have firearms in your house, make sure you follow all gun safety procedures. Seek help if you have been physically or sexually abused. What's next? Go to your health care provider once a year for an annual wellness visit. Ask your health care provider how often you should have your eyes and teeth checked. Stay up to date on all vaccines. This information is not intended to replace advice given to you by your health care provider. Make sure you discuss any questions you have with your health care  provider. Document Revised: 04/29/2021 Document Reviewed: 04/29/2021 Elsevier Patient Education  [place].

## 2023-02-16 ENCOUNTER — Other Ambulatory Visit: Payer: Self-pay | Admitting: Nurse Practitioner

## 2023-02-16 NOTE — Telephone Encounter (Signed)
Requested Prescriptions  Pending Prescriptions Disp Refills   famotidine (PEPCID) 20 MG tablet [Pharmacy Med Name: FAMOTIDINE 20 MG TABLET] 180 tablet 0    Sig: TAKE 1 TABLET BY MOUTH TWICE A DAY     Gastroenterology:  H2 Antagonists Passed - 02/16/2023  2:27 AM      Passed - Valid encounter within last 12 months    Recent Outpatient Visits           4 months ago 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[place])   [place] M[place]um, Erin E, PA-C   7 months ago Dermatitis due to unknown cause   [facility] Mecum, Erin E, PA-C   7 months ago Urinary symptom or sign   [place], Merrill T, NP   11 months ago Acute non-recurrent frontal sinusitis   [place] [facility] [place], Megan P, DO   11 months ago Allergic rhinitis, unspecified seasonality, unspecified trigger   [place] Kathrine Haddock, NP

## 2023-02-23 ENCOUNTER — Encounter: Payer: Self-pay | Admitting: Nurse Practitioner

## 2023-02-26 NOTE — Patient Instructions (Incomplete)
Ehlers-Danlos Syndrome Ehlers-Danlos syndrome is a group of genetic disorders that affect the body's connective tissues, which are made up of proteins. Connective tissues give support and elasticity to the skin, bones, joints, blood vessels, and internal organs. These disorders cause connective tissues to become weak, fragile, and stretchable. People with these disorders often have overly flexible joints and stretchy, fragile skin. Ehlers-Danlos syndrome has 13 types, but many of those are rare. The most common types affect joints and skin. Other types affect the eyes, spine, or heart valves. The most severe types affect the walls of blood vessels and the structures of internal organs. What are the causes? This condition is caused by abnormal genes (genetic defects). Some types are passed from parent to child (inherited), and others are not. Sometimes, the genes can become defective during development in the womb. What are the signs or symptoms? The signs and symptoms of this condition depend on the type. Signs and symptoms may start in childhood or later in life. Signs and symptoms of the common types include: Joint and muscle symptoms Overly flexible joints that have a wide range of motion and can become displaced easily. Joint pain. Weak, floppy muscles. Skin symptoms Velvety, soft skin that stretches and sags. Skin that bruises and tears easily. Skin tears that are hard to repair with stitches (sutures) and that heal with wide scars. Lumps under the skin. Vision symptoms Tearing of fibers of the eyeball or tearing of the tissue inside the eye (retinal detachment). Changes in vision, including seeing floaters or flashes, or having sudden vision loss. Vascular symptoms Bleeding from tears of internal organs or blood vessels. Weak heart valves causing weakening of the heart. Orthopedic symptoms A curved spine, which can cause trouble breathing. Bowed limbs and short stature. Muscle or  tendon tears. Weak, thin bones. A club foot. Other symptoms A collapsed lung. Unusual facial features, such as a thin nose and lips, sunken cheeks, small earlobes, and prominent eyes. How is this diagnosed? This condition may be diagnosed based on symptoms and medical history, especially if the condition runs in your family. Your health care provider will also do a physical exam. You may also have tests, such as: Blood tests to check for genetic defects. Tissue tests to check for abnormal body proteins (collagen). X-rays of your spine. Imaging of internal organs, such as an MRI or a CT scan. A bone scan. Heart ultrasound (echocardiogram) to look for heart valve problems. How is this treated? There is no cure for this condition, but treatment can help symptoms and prevent complications. Treatment depends on the type of condition you have and the symptoms it causes. Treatment options can include: Over-the-counter pain medicines. Vitamin C to strengthen skin. Vitamin D and calcium to strengthen bones. Blood pressure medicines to prevent blood vessel tears. Physical therapy exercises to improve movement and strength in your muscles and joints. Assistive devices that help you move, such as braces or a wheelchair, a walker, or a scooter. Surgery to repair wounds, tears, or injured joints. Follow these instructions at home: Activity Do exercises as told by your health care provider. Return to your normal activities as told by your health care provider. Ask your health care provider what activities are safe for you. You may need to avoid doing contact sports and heavy lifting. General instructions  Take over-the-counter and prescription medicines only as told by your health care provider. Let your health care provider know if you become pregnant. You will need special care to prevent  tearing of the womb. Tell all health care providers that you have this condition before you have any surgery  or procedure. Wear a medical alert bracelet. Schedule eye exams often. Consider having genetic counseling to learn if you may pass the condition on to your children. Do not use any products that contain nicotine or tobacco. These products include cigarettes, chewing tobacco, and vaping devices, such as e-cigarettes. If you need help quitting, ask your health care provider. Keep all follow-up visits. This is important. Where to find more information National Organization for Rare Disorders: rarediseases.org Contact a health care provider if: You have joint pain. You cut or tear your skin. You have weakness or shortness of breath. You have any new symptoms. Get help right away if you have: Any sudden and severe pain. Chest pain or trouble breathing. Sudden vision loss, or you see floaters or flashes. These symptoms may represent a serious problem that is an emergency. Do not wait to see if the symptoms will go away. Get medical help right away. Call your local emergency services (911 in the [place] Do not drive yourself to the hospital. Summary Ehlers-Danlos syndrome is a group of genetic disorders that affect the connective tissues of the body. This condition is caused by abnormal genes (genetic defects). Symptoms range from overly flexible joints and fragile skin to very serious complications, such as blood vessel or organ tears. There is no cure for this condition, but treatment can relieve some symptoms and prevent complications. This information is not intended to replace advice given to you by your health care provider. Make sure you discuss any questions you have with your health care provider. Document Revised: 02/11/2021 Document Reviewed: 02/11/2021 Elsevier Patient Education  2023 [employer].

## 2023-02-28 ENCOUNTER — Encounter: Payer: Self-pay | Admitting: Nurse Practitioner

## 2023-02-28 ENCOUNTER — Ambulatory Visit (INDEPENDENT_AMBULATORY_CARE_PROVIDER_SITE_OTHER): Payer: 59 | Admitting: Nurse Practitioner

## 2023-02-28 VITALS — BP 94/48 | HR 67 | Temp 98.7°F | Ht 68.5 in | Wt 224.3 lb

## 2023-02-28 DIAGNOSIS — Z13220 Encounter for screening for lipoid disorders: Secondary | ICD-10-CM

## 2023-02-28 DIAGNOSIS — M797 Fibromyalgia: Secondary | ICD-10-CM | POA: Diagnosis not present

## 2023-02-28 DIAGNOSIS — Z136 Encounter for screening for cardiovascular disorders: Secondary | ICD-10-CM

## 2023-02-28 DIAGNOSIS — Q7962 Hypermobile Ehlers-Danlos syndrome: Secondary | ICD-10-CM | POA: Diagnosis not present

## 2023-02-28 DIAGNOSIS — G9332 Myalgic encephalomyelitis/chronic fatigue syndrome: Secondary | ICD-10-CM | POA: Diagnosis not present

## 2023-02-28 DIAGNOSIS — E88819 Insulin resistance, unspecified: Secondary | ICD-10-CM | POA: Diagnosis not present

## 2023-02-28 DIAGNOSIS — Z111 Encounter for screening for respiratory tuberculosis: Secondary | ICD-10-CM

## 2023-02-28 DIAGNOSIS — G894 Chronic pain syndrome: Secondary | ICD-10-CM | POA: Diagnosis not present

## 2023-02-28 DIAGNOSIS — F331 Major depressive disorder, recurrent, moderate: Secondary | ICD-10-CM

## 2023-02-28 DIAGNOSIS — E282 Polycystic ovarian syndrome: Secondary | ICD-10-CM

## 2023-02-28 NOTE — Assessment & Plan Note (Addendum)
Followed by GYN, continue this collaboration and medication as ordered by them.

## 2023-02-28 NOTE — Assessment & Plan Note (Signed)
Chronic, ongoing.  At this time continue medication regimen as ordered by ortho.  She does use MJ, which offers her benefit -- used this in [place].  Discussed with her regulations in [place], which she is aware of.

## 2023-02-28 NOTE — Assessment & Plan Note (Signed)
Refer to Lorinda Creed plan of care.

## 2023-02-28 NOTE — Assessment & Plan Note (Signed)
Check labs today.  Continue Metformin per GYN.
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
## 2023-02-28 NOTE — Assessment & Plan Note (Addendum)
Chronic, stable at this time.  Denies SI/HI.  Is seeing therapy, continue this relationship and Duloxetine for pain and mood.

## 2023-02-28 NOTE — Addendum Note (Signed)
Addended by: Aura Dials T on: 02/28/2023 01:07 PM   Modules accepted: Orders

## 2023-02-28 NOTE — Assessment & Plan Note (Signed)
Chronic, ongoing .  Continue collaboration with ortho, which has been beneficial for patient.  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  Would benefit multidisciplinary approach.  Labs today.

## 2023-02-28 NOTE — Assessment & Plan Note (Signed)
Chronic, ongoing, with Lorinda Creed.  At this time continue medications as ordered by ortho.  Labs today.

## 2023-03-01 NOTE — Progress Notes (Signed)
Contacted via MyChart   Good afternoon Stacy West, current labs overall look fantastic.  No changes needed.  Hormones do not appear post menopausal yet, waiting on estrogen level to return.  A few more labs are pending.  Any questions?  Overall these really are good labs. Keep being stellar!!  Thank you for allowing me to participate in your care.  I appreciate you. Kindest regards, Laloni Rowton

## 2023-03-02 ENCOUNTER — Telehealth: Payer: Self-pay | Admitting: Nurse Practitioner

## 2023-03-02 NOTE — Telephone Encounter (Signed)
Zoey from [employer] called to report that they need clinical notes that contain the same verbiage as the orders for the pt's wheelchair in order for insurance to accept/cover.   Best contact: [phone] Fax: [phone]

## 2023-03-03 NOTE — Progress Notes (Signed)
TB testing negative

## 2023-03-03 NOTE — Telephone Encounter (Signed)
Office notes have been faxed to [employer] [facility]

## 2023-03-04 LAB — COMPREHENSIVE METABOLIC PANEL
ALT: 14 IU/L (ref 0–32)
AST: 11 IU/L (ref 0–40)
Albumin/Globulin Ratio: 2.1 (ref 1.2–2.2)
Albumin: 4.5 g/dL (ref 3.9–4.9)
Alkaline Phosphatase: 63 IU/L (ref 44–121)
BUN/Creatinine Ratio: 14 (ref 9–23)
BUN: 11 mg/dL (ref 6–24)
Bilirubin Total: 0.2 mg/dL (ref 0.0–1.2)
CO2: 23 mmol/L (ref 20–29)
Calcium: 9.5 mg/dL (ref 8.7–10.2)
Chloride: 103 mmol/L (ref 96–106)
Creatinine, Ser: 0.77 mg/dL (ref 0.57–1.00)
Globulin, Total: 2.1 g/dL (ref 1.5–4.5)
Glucose: 86 mg/dL (ref 70–99)
Potassium: 5 mmol/L (ref 3.5–5.2)
Sodium: 138 mmol/L (ref 134–144)
Total Protein: 6.6 g/dL (ref 6.0–8.5)
eGFR: 100 mL/min/{1.73_m2} (ref >59)

## 2023-03-04 LAB — CBC WITH DIFFERENTIAL/PLATELET
Basophils Absolute: 0 10*3/uL (ref 0.0–0.2)
Basos: 1 %
EOS (ABSOLUTE): 0 10*3/uL (ref 0.0–0.4)
Eos: 1 %
Hematocrit: 40.5 % (ref 34.0–46.6)
Hemoglobin: 13.8 g/dL (ref 11.1–15.9)
Immature Grans (Abs): 0 10*3/uL (ref 0.0–0.1)
Immature Granulocytes: 0 %
Lymphocytes Absolute: 2.1 10*3/uL (ref 0.7–3.1)
Lymphs: 41 %
MCH: 32 pg (ref 26.6–33.0)
MCHC: 34.1 g/dL (ref 31.5–35.7)
MCV: 94 fL (ref 79–97)
Monocytes Absolute: 0.4 10*3/uL (ref 0.1–0.9)
Monocytes: 7 %
Neutrophils Absolute: 2.5 10*3/uL (ref 1.4–7.0)
Neutrophils: 50 %
Platelets: 292 10*3/uL (ref 150–450)
RBC: 4.31 x10E6/uL (ref 3.77–5.28)
RDW: 12.5 % (ref 11.7–15.4)
WBC: 5 10*3/uL (ref 3.4–10.8)

## 2023-03-04 LAB — QUANTIFERON-TB GOLD PLUS
QuantiFERON Mitogen Value: >10 IU/mL
QuantiFERON Nil Value: 0.07 IU/mL
QuantiFERON TB1 Ag Value: 0.07 IU/mL
QuantiFERON TB2 Ag Value: 0.05 IU/mL
QuantiFERON-TB Gold Plus: NEGATIVE

## 2023-03-04 LAB — IRON AND TIBC
Iron Saturation: 25 % (ref 15–55)
Iron: 89 ug/dL (ref 27–159)
Total Iron Binding Capacity: 358 ug/dL (ref 250–450)
UIBC: 269 ug/dL (ref 131–425)

## 2023-03-04 LAB — TSH: TSH: 1.06 u[IU]/mL (ref 0.450–4.500)

## 2023-03-04 LAB — MAGNESIUM: Magnesium: 2 mg/dL (ref 1.6–2.3)

## 2023-03-04 LAB — FSH/LH
FSH: 14.4 m[IU]/mL
LH: 16.8 m[IU]/mL

## 2023-03-04 LAB — ZINC: Zinc: 61 ug/dL (ref 44–115)

## 2023-03-04 LAB — SEDIMENTATION RATE: Sed Rate: 6 mm/hr (ref 0–32)

## 2023-03-04 LAB — LIPID PANEL W/O CHOL/HDL RATIO
Cholesterol, Total: 165 mg/dL (ref 100–199)
HDL: 58 mg/dL (ref >39)
LDL Chol Calc (NIH): 94 mg/dL (ref 0–99)
Triglycerides: 65 mg/dL (ref 0–149)
VLDL Cholesterol Cal: 13 mg/dL (ref 5–40)

## 2023-03-04 LAB — HEMOGLOBIN A1C
Est. average glucose Bld gHb Est-mCnc: 103 mg/dL
Hgb A1c MFr Bld: 5.2 % (ref 4.8–5.6)

## 2023-03-04 LAB — FERRITIN: Ferritin: 78 ng/mL (ref 15–150)

## 2023-03-04 LAB — ESTROGENS, TOTAL: Estrogen: 248 pg/mL

## 2023-03-04 LAB — IGE: IgE (Immunoglobulin E), Serum: 7 IU/mL (ref 6–495)

## 2023-03-04 LAB — VITAMIN D 25 HYDROXY (VIT D DEFICIENCY, FRACTURES): Vit D, 25-Hydroxy: 31.3 ng/mL (ref 30.0–100.0)

## 2023-03-04 LAB — C-REACTIVE PROTEIN: CRP: 3 mg/L (ref 0–10)

## 2023-03-04 NOTE — Progress Notes (Signed)
Contacted via MyChart   Overall remainder of testing has returned in normal ranges:)

## 2023-03-09 DIAGNOSIS — M79662 Pain in left lower leg: Secondary | ICD-10-CM | POA: Diagnosis not present

## 2023-03-09 DIAGNOSIS — M25579 Pain in unspecified ankle and joints of unspecified foot: Secondary | ICD-10-CM | POA: Diagnosis not present

## 2023-03-18 ENCOUNTER — Encounter: Payer: Self-pay | Admitting: Family Medicine

## 2023-03-18 DIAGNOSIS — R519 Headache, unspecified: Secondary | ICD-10-CM | POA: Diagnosis not present

## 2023-03-18 DIAGNOSIS — M5412 Radiculopathy, cervical region: Secondary | ICD-10-CM | POA: Diagnosis not present

## 2023-03-18 DIAGNOSIS — M9902 Segmental and somatic dysfunction of thoracic region: Secondary | ICD-10-CM | POA: Diagnosis not present

## 2023-03-18 DIAGNOSIS — M9901 Segmental and somatic dysfunction of cervical region: Secondary | ICD-10-CM | POA: Diagnosis not present

## 2023-04-06 ENCOUNTER — Other Ambulatory Visit: Payer: Self-pay | Admitting: Nurse Practitioner

## 2023-04-06 DIAGNOSIS — D8940 Mast cell activation, unspecified: Secondary | ICD-10-CM

## 2023-04-06 NOTE — Telephone Encounter (Signed)
Requested Prescriptions  Pending Prescriptions Disp Refills   cetirizine (ZYRTEC) 10 MG tablet [Pharmacy Med Name: CETIRIZINE HCL 10 MG TABLET] 90 tablet 0    Sig: TAKE 1 TABLET BY MOUTH EVERY DAY     Ear, Nose, and Throat:  Antihistamines 2 Passed - 04/06/2023  2:31 AM      Passed - Cr in normal range and within 360 days    Creatinine, Ser  Date Value Ref Range Status  02/28/2023 0.77 0.57 - 1.00 mg/dL Final         Passed - Valid encounter within last 12 months    Recent Outpatient Visits           1 month ago Moderate episode of recurrent major depressive disorder (HCC)   [place] [facility] [place], Corrie Dandy T, NP   5 months ago 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HCC)   [facility] [place] Mecum, Erin E, PA-C   9 months ago Dermatitis due to unknown cause   [place] [facility] Mecum, Erin E, PA-C   9 months ago Urinary symptom or sign   [place] [facility] [place], [place] T, NP   1 year ago Acute non-recurrent frontal sinusitis   [place] [facility] [place], Megan P, DO       Future Appointments             In 4 months Cannady, Dorie Rank, NP  [facility]

## 2023-04-13 DIAGNOSIS — M9902 Segmental and somatic dysfunction of thoracic region: Secondary | ICD-10-CM | POA: Diagnosis not present

## 2023-04-13 DIAGNOSIS — M9901 Segmental and somatic dysfunction of cervical region: Secondary | ICD-10-CM | POA: Diagnosis not present

## 2023-04-13 DIAGNOSIS — M5412 Radiculopathy, cervical region: Secondary | ICD-10-CM | POA: Diagnosis not present

## 2023-04-13 DIAGNOSIS — R519 Headache, unspecified: Secondary | ICD-10-CM | POA: Diagnosis not present

## 2023-04-16 ENCOUNTER — Other Ambulatory Visit: Payer: Self-pay | Admitting: Nurse Practitioner

## 2023-04-18 NOTE — Telephone Encounter (Signed)
Requested medication (s) are due for refill today: -  Requested medication (s) are on the active medication list: historical med   Last refill:  02/28/23  Future visit scheduled: yes  Notes to clinic:  historical med and provider   Requested Prescriptions  Pending Prescriptions Disp Refills   levocetirizine (XYZAL) 5 MG tablet [Pharmacy Med Name: LEVOCETIRIZINE 5 MG TABLET] 90 tablet 1    Sig: TAKE 1 TABLET BY MOUTH EVERY DAY IN THE EVENING     Ear, Nose, and Throat:  Antihistamines - levocetirizine dihydrochloride Passed - 04/16/2023 11:08 AM      Passed - Cr in normal range and within 360 days    Creatinine, Ser  Date Value Ref Range Status  02/28/2023 0.77 0.57 - 1.00 mg/dL Final         Passed - eGFR is 10 or above and within 360 days    GFR calc Af Amer  Date Value Ref Range Status  05/25/2019 >60 >60 mL/min Final   GFR calc non Af Amer  Date Value Ref Range Status  05/25/2019 >60 >60 mL/min Final   eGFR  Date Value Ref Range Status  02/28/2023 100 >59 mL/min/1.73 Final         Passed - Valid encounter within last 12 months    Recent Outpatient Visits           1 month ago Moderate episode of recurrent major depressive disorder (HCC)   [place] [facility] [place], Corrie Dandy T, NP   6 months ago 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HCC)   [place] [facility] Mecum, Erin E, PA-C   9 months ago Dermatitis due to unknown cause   [place] [facility] Mecum, Erin E, PA-C   10 months ago Urinary symptom or sign   [place] [facility] [place], Duncan T, NP   1 year ago Acute non-recurrent frontal sinusitis    [facility] [place], Megan P, DO       Future Appointments             In 4 months Cannady, Dorie Rank, NP  [facility], [facility]

## 2023-04-19 DIAGNOSIS — R5381 Other malaise: Secondary | ICD-10-CM | POA: Diagnosis not present

## 2023-04-19 DIAGNOSIS — Z91018 Allergy to other foods: Secondary | ICD-10-CM | POA: Diagnosis not present

## 2023-04-19 DIAGNOSIS — R42 Dizziness and giddiness: Secondary | ICD-10-CM | POA: Diagnosis not present

## 2023-04-19 DIAGNOSIS — R55 Syncope and collapse: Secondary | ICD-10-CM | POA: Diagnosis not present

## 2023-04-19 DIAGNOSIS — R002 Palpitations: Secondary | ICD-10-CM | POA: Diagnosis not present

## 2023-04-19 DIAGNOSIS — T7840XA Allergy, unspecified, initial encounter: Secondary | ICD-10-CM | POA: Diagnosis not present

## 2023-04-19 DIAGNOSIS — R21 Rash and other nonspecific skin eruption: Secondary | ICD-10-CM | POA: Diagnosis not present

## 2023-04-20 ENCOUNTER — Telehealth: Payer: Self-pay

## 2023-04-20 NOTE — Telephone Encounter (Signed)
(  Dr. Selena Batten patient) Patient wants to know if she is diagnosed with Mast Cell Activation, and can she be referred to the specialist for Mast Cell Activation? She is still taking Antihistamines and Pepcid,and she states she still has issues with sleeping, and she's still have sinus infections and colds. She states she is worried about long term effects. She states she doesn't think the doctor here believes her about her issues. She wants someone who is experienced in Mast Cell Activation and Potts Disease. She would like to see another allergist besides the provider she saw here last. I informed patient I would send this message on to my superior and have them look into it since Dr. Selena Batten is out of the office for a few weeks. My thoughts: Maybe she should see someone other than a local allergist ([facility]) and see someone more involved with Mast Cell Activation Syndrome like Kirstie Mirza, or [facility].   [place] [phone]

## 2023-04-24 NOTE — Patient Instructions (Addendum)
Send me the name of the psychiatry provider you want to see.  Ehlers-Danlos Syndrome Ehlers-Danlos syndrome is a group of genetic disorders that affect the body's connective tissues, which are made up of proteins. Connective tissues give support and elasticity to the skin, bones, joints, blood vessels, and internal organs. These disorders cause connective tissues to become weak, fragile, and stretchable. People with these disorders often have overly flexible joints and stretchy, fragile skin. Ehlers-Danlos syndrome has 13 types, but many of those are rare. The most common types affect joints and skin. Other types affect the eyes, spine, or heart valves. The most severe types affect the walls of blood vessels and the structures of internal organs. What are the causes? This condition is caused by abnormal genes (genetic defects). Some types are passed from parent to child (inherited), and others are not. Sometimes, the genes can become defective during development in the womb. What are the signs or symptoms? The signs and symptoms of this condition depend on the type. Signs and symptoms may start in childhood or later in life. Signs and symptoms of the common types include: Joint and muscle symptoms Overly flexible joints that have a wide range of motion and can become displaced easily. Joint pain. Weak, floppy muscles. Skin symptoms Velvety, soft skin that stretches and sags. Skin that bruises and tears easily. Skin tears that are hard to repair with stitches (sutures) and that heal with wide scars. Lumps under the skin. Vision symptoms Tearing of fibers of the eyeball or tearing of the tissue inside the eye (retinal detachment). Changes in vision, including seeing floaters or flashes, or having sudden vision loss. Vascular symptoms Bleeding from tears of internal organs or blood vessels. Weak heart valves causing weakening of the heart. Orthopedic symptoms A curved spine, which can cause  trouble breathing. Bowed limbs and short stature. Muscle or tendon tears. Weak, thin bones. A club foot. Other symptoms A collapsed lung. Unusual facial features, such as a thin nose and lips, sunken cheeks, small earlobes, and prominent eyes. How is this diagnosed? This condition may be diagnosed based on symptoms and medical history, especially if the condition runs in your family. Your health care provider will also do a physical exam. You may also have tests, such as: Blood tests to check for genetic defects. Tissue tests to check for abnormal body proteins (collagen). X-rays of your spine. Imaging of internal organs, such as an MRI or a CT scan. A bone scan. Heart ultrasound (echocardiogram) to look for heart valve problems. How is this treated? There is no cure for this condition, but treatment can help symptoms and prevent complications. Treatment depends on the type of condition you have and the symptoms it causes. Treatment options can include: Over-the-counter pain medicines. Vitamin C to strengthen skin. Vitamin D and calcium to strengthen bones. Blood pressure medicines to prevent blood vessel tears. Physical therapy exercises to improve movement and strength in your muscles and joints. Assistive devices that help you move, such as braces or a wheelchair, a walker, or a scooter. Surgery to repair wounds, tears, or injured joints. Follow these instructions at home: Activity Do exercises as told by your health care provider. Return to your normal activities as told by your health care provider. Ask your health care provider what activities are safe for you. You may need to avoid doing contact sports and heavy lifting. General instructions  Take over-the-counter and prescription medicines only as told by your health care provider. Let your health care  provider know if you become pregnant. You will need special care to prevent tearing of the womb. Tell all health care  providers that you have this condition before you have any surgery or procedure. Wear a medical alert bracelet. Schedule eye exams often. Consider having genetic counseling to learn if you may pass the condition on to your children. Do not use any products that contain nicotine or tobacco. These products include cigarettes, chewing tobacco, and vaping devices, such as e-cigarettes. If you need help quitting, ask your health care provider. Keep all follow-up visits. This is important. Where to find more information National Organization for Rare Disorders: rarediseases.org Contact a health care provider if: You have joint pain. You cut or tear your skin. You have weakness or shortness of breath. You have any new symptoms. Get help right away if you have: Any sudden and severe pain. Chest pain or trouble breathing. Sudden vision loss, or you see floaters or flashes. These symptoms may represent a serious problem that is an emergency. Do not wait to see if the symptoms will go away. Get medical help right away. Call your local emergency services (911 in the [place] Do not drive yourself to the hospital. Summary Ehlers-Danlos syndrome is a group of genetic disorders that affect the connective tissues of the body. This condition is caused by abnormal genes (genetic defects). Symptoms range from overly flexible joints and fragile skin to very serious complications, such as blood vessel or organ tears. There is no cure for this condition, but treatment can relieve some symptoms and prevent complications. This information is not intended to replace advice given to you by your health care provider. Make sure you discuss any questions you have with your health care provider. Document Revised: 02/11/2021 Document Reviewed: 02/11/2021 Elsevier Patient Education  2024 [employer].

## 2023-04-29 ENCOUNTER — Encounter: Payer: Self-pay | Admitting: Nurse Practitioner

## 2023-04-29 ENCOUNTER — Ambulatory Visit (INDEPENDENT_AMBULATORY_CARE_PROVIDER_SITE_OTHER): Payer: 59 | Admitting: Nurse Practitioner

## 2023-04-29 VITALS — BP 140/78 | HR 93 | Temp 98.7°F | Ht 68.5 in | Wt 224.4 lb

## 2023-04-29 DIAGNOSIS — Q7962 Hypermobile Ehlers-Danlos syndrome: Secondary | ICD-10-CM

## 2023-04-29 DIAGNOSIS — F331 Major depressive disorder, recurrent, moderate: Secondary | ICD-10-CM | POA: Diagnosis not present

## 2023-04-29 DIAGNOSIS — D8940 Mast cell activation, unspecified: Secondary | ICD-10-CM

## 2023-04-29 DIAGNOSIS — R42 Dizziness and giddiness: Secondary | ICD-10-CM | POA: Diagnosis not present

## 2023-04-29 DIAGNOSIS — R5381 Other malaise: Secondary | ICD-10-CM | POA: Diagnosis not present

## 2023-04-29 DIAGNOSIS — E538 Deficiency of other specified B group vitamins: Secondary | ICD-10-CM | POA: Diagnosis not present

## 2023-04-29 DIAGNOSIS — R002 Palpitations: Secondary | ICD-10-CM | POA: Diagnosis not present

## 2023-04-29 DIAGNOSIS — R55 Syncope and collapse: Secondary | ICD-10-CM | POA: Diagnosis not present

## 2023-04-29 MED ORDER — CHOLECALCIFEROL 1.25 MG (50000 UT) PO TABS: 1.0000 | ORAL_TABLET | ORAL | 4 refills | Status: DC

## 2023-04-29 MED ORDER — CYANOCOBALAMIN 1000 MCG/ML IJ SOLN: 1000.0000 ug | INTRAMUSCULAR | 4 refills | Status: DC

## 2023-04-29 MED ORDER — "BD LUER-LOK SYRINGE 20G X 1"" 3 ML MISC": 4 refills | Status: DC

## 2023-04-29 NOTE — Assessment & Plan Note (Signed)
Has a referral placed per [facility] to see Dr. Livingston Diones for further work-up and evaluation for this.

## 2023-04-29 NOTE — Assessment & Plan Note (Signed)
Chronic, ongoing with recent level at [facility] 229.  She has not had benefit from oral supplement.  Send in injections for her to perform at home, she is comfortable with doing these as provides injections to her husband.

## 2023-04-29 NOTE — Progress Notes (Signed)
BP (!) 140/78 Comment: From patinet watch would not let me use machine or do manual  Pulse 93   Temp 98.7 F (37.1 C) (Oral)   Ht 5' 8.5" (1.74 m)   Wt 224 lb 6.4 oz (101.8 kg)   SpO2 97%   BMI 33.62 kg/m    Subjective:    Patient ID: Stacy West, female    DOB: Dec 08, 1980, 42 y.o.   MRN: [id number]  HPI: Stacy West is a 42 y.o. female  Chief Complaint  Patient presents with   Elher danlos    Wants to know if she has Mast Cell activation   Psychiatric Evaluation    Wants a referral for psych   FIBROMYALGIA & EHLERS DANLOS SYNDROME  Diagnosed when 6 months pregnant. Due to her chronic illnesses and underlying sacroiliitis, has known chronic pain. Followed by sports medicine, with last visit 01/06/23. In past took Gabapentin, which made her feel tired and foggy feeling.  Currently taking Duloxetine, Lyrica, and Flexeril.     Has been unable to ambulate to full extent for long distances due to her Lorinda Creed, has received PT and this was beneficial, but continues to have muscle spasms to lower extremities.  She saw [facility] on 04/19/23, they have placed referral for mast cell activation assessment (Dr. Livingston Diones), as there is concern for this.  Her B12 level was 229 on their recent labs, takes oral supplements for this and Vit D.   Followed by GYN for PCOS and insulin resistance, last visit 08/30/22. Continues Metformin BID.   She would like to see psychiatry for autism spectrum disorder diagnosis. Pain status: stable Satisfied with current treatment?: yes Medication side effects: no Medication compliance: good compliance Duration: chronic Location: SI joint and lower legs Quality: dull, aching, and throbbing Current pain level: 3/10 Previous pain level: 3/10 Aggravating factors: movement, walking, and bending Alleviating factors: Lyrica and Duloxetine Previous pain specialty evaluation: yes Non-narcotic analgesic meds: yes Narcotic  contract:no Treatments attempted: Lyrica and Duloxetine      04/29/2023   10:33 AM 02/28/2023    9:41 AM 02/08/2023    3:28 PM 10/14/2022    2:02 PM 06/29/2022    9:52 AM  Depression screen PHQ 2/9  Decreased Interest 1  0 3 1  Down, Depressed, Hopeless 1 0 1 2 0  PHQ - 2 Score 2 0 1 5 1   Altered sleeping 0 1 1 3 3   Tired, decreased energy 3 1 1 3  0  Change in appetite 1 1 1 3 1   Feeling bad or failure about yourself  0 1  2 0  Trouble concentrating 3 1  3 2   Moving slowly or fidgety/restless 0 0  0 2  Suicidal thoughts 0 0  0 0  PHQ-9 Score 9 5 4 19 9   Difficult doing work/chores Very difficult Not difficult at all Not difficult at all Very difficult Somewhat difficult      04/29/2023   10:34 AM 02/28/2023    9:41 AM 10/14/2022    2:02 PM 06/29/2022    9:52 AM  GAD 7 : Generalized Anxiety Score  Nervous, Anxious, on Edge 1 1 2  0  Control/stop worrying 0 1 2 0  Worry too much - different things 0 1 3 0  Trouble relaxing 3 1 3 3   Restless 3 1 0 3  Easily annoyed or irritable 1 1 2 1   Afraid - awful might happen 0 0 3 0  Total GAD 7 Score  8 6 15 7   Anxiety Difficulty Very difficult Not difficult at all Somewhat difficult Somewhat difficult      Relevant past medical, surgical, family and social history reviewed and updated as indicated. Interim medical history since our last visit reviewed. Allergies and medications reviewed and updated.  Review of Systems  Constitutional:  Negative for activity change, appetite change, diaphoresis, fatigue and fever.  Respiratory:  Negative for cough, chest tightness and shortness of breath.   Cardiovascular:  Negative for chest pain, palpitations and leg swelling.  Gastrointestinal: Negative.   Endocrine: Negative for cold intolerance, heat intolerance, polydipsia, polyphagia and polyuria.  Musculoskeletal:  Positive for arthralgias.  Neurological:  Negative for dizziness, syncope, weakness, light-headedness, numbness and headaches.   Psychiatric/Behavioral:  Positive for decreased concentration. Negative for sleep disturbance and suicidal ideas. The patient is nervous/anxious.     Per HPI unless specifically indicated above     Objective:    BP (!) 140/78 Comment: From patinet watch would not let me use machine or do manual  Pulse 93   Temp 98.7 F (37.1 C) (Oral)   Ht 5' 8.5" (1.74 m)   Wt 224 lb 6.4 oz (101.8 kg)   SpO2 97%   BMI 33.62 kg/m   Wt Readings from Last 3 Encounters:  04/29/23 224 lb 6.4 oz (101.8 kg)  02/28/23 224 lb 4.8 oz (101.7 kg)  02/08/23 216 lb (98 kg)    Physical Exam Vitals and nursing note reviewed.  Constitutional:      General: She is awake. She is not in acute distress.    Appearance: She is well-developed and well-groomed. She is not ill-appearing or toxic-appearing.  HENT:     Head: Normocephalic.     Right Ear: Hearing and external ear normal.     Left Ear: Hearing and external ear normal.  Eyes:     General: Lids are normal.        Right eye: No discharge.        Left eye: No discharge.     Conjunctiva/sclera: Conjunctivae normal.     Pupils: Pupils are equal, round, and reactive to light.  Neck:     Thyroid: No thyromegaly.     Vascular: No carotid bruit.  Cardiovascular:     Rate and Rhythm: Normal rate and regular rhythm.     Heart sounds: Normal heart sounds. No murmur heard.    No gallop.  Pulmonary:     Effort: Pulmonary effort is normal. No accessory muscle usage or respiratory distress.     Breath sounds: Normal breath sounds.  Abdominal:     General: Bowel sounds are normal.     Palpations: Abdomen is soft. There is no hepatomegaly or splenomegaly.  Musculoskeletal:     Cervical back: Normal range of motion and neck supple.     Right lower leg: No edema.     Left lower leg: No edema.  Lymphadenopathy:     Cervical: No cervical adenopathy.  Skin:    General: Skin is warm and dry.  Neurological:     Mental Status: She is alert and oriented to  person, place, and time.     Cranial Nerves: Cranial nerves 2-12 are intact.     Motor: Motor function is intact.     Coordination: Coordination is intact.     Gait: Gait is intact.     Deep Tendon Reflexes:     Reflex Scores:      Brachioradialis reflexes are 1+ on the  right side and 1+ on the left side.      Patellar reflexes are 1+ on the right side and 1+ on the left side. Psychiatric:        Attention and Perception: Attention normal.        Mood and Affect: Mood normal.        Speech: Speech normal.        Behavior: Behavior normal. Behavior is cooperative.        Thought Content: Thought content normal.     Results for orders placed or performed in visit on 02/28/23  VITAMIN D 25 Hydroxy (Vit-D Deficiency, Fractures)  Result Value Ref Range   Vit D, 25-Hydroxy 31.3 30.0 - 100.0 ng/mL  Zinc  Result Value Ref Range   Zinc 61 44 - 115 ug/dL  Magnesium  Result Value Ref Range   Magnesium 2.0 1.6 - 2.3 mg/dL  Iron Binding Cap (TIBC)([facility])  Result Value Ref Range   Total Iron Binding Capacity 358 250 - 450 ug/dL   UIBC 161 096 - 045 ug/dL   Iron 89 27 - 409 ug/dL   Iron Saturation 25 15 - 55 %  Ferritin  Result Value Ref Range   Ferritin 78 15 - 150 ng/mL  CBC with Differential/Platelet  Result Value Ref Range   WBC 5.0 3.4 - 10.8 x10E3/uL   RBC 4.31 3.77 - 5.28 x10E6/uL   Hemoglobin 13.8 11.1 - 15.9 g/dL   Hematocrit 81.1 91.4 - 46.6 %   MCV 94 79 - 97 fL   MCH 32.0 26.6 - 33.0 pg   MCHC 34.1 31.5 - 35.7 g/dL   RDW 78.2 95.6 - 21.3 %   Platelets 292 150 - 450 x10E3/uL   Neutrophils 50 Not Estab. %   Lymphs 41 Not Estab. %   Monocytes 7 Not Estab. %   Eos 1 Not Estab. %   Basos 1 Not Estab. %   Neutrophils Absolute 2.5 1.4 - 7.0 x10E3/uL   Lymphocytes Absolute 2.1 0.7 - 3.1 x10E3/uL   Monocytes Absolute 0.4 0.1 - 0.9 x10E3/uL   EOS (ABSOLUTE) 0.0 0.0 - 0.4 x10E3/uL   Basophils Absolute 0.0 0.0 - 0.2 x10E3/uL   Immature Granulocytes 0 Not Estab. %    Immature Grans (Abs) 0.0 0.0 - 0.1 x10E3/uL  Comprehensive metabolic panel  Result Value Ref Range   Glucose 86 70 - 99 mg/dL   BUN 11 6 - 24 mg/dL   Creatinine, Ser 0.86 0.57 - 1.00 mg/dL   eGFR 578 >46 NG/EXB/2.84   BUN/Creatinine Ratio 14 9 - 23   Sodium 138 134 - 144 mmol/L   Potassium 5.0 3.5 - 5.2 mmol/L   Chloride 103 96 - 106 mmol/L   CO2 23 20 - 29 mmol/L   Calcium 9.5 8.7 - 10.2 mg/dL   Total Protein 6.6 6.0 - 8.5 g/dL   Albumin 4.5 3.9 - 4.9 g/dL   Globulin, Total 2.1 1.5 - 4.5 g/dL   Albumin/Globulin Ratio 2.1 1.2 - 2.2   Bilirubin Total 0.2 0.0 - 1.2 mg/dL   Alkaline Phosphatase 63 44 - 121 IU/L   AST 11 0 - 40 IU/L   ALT 14 0 - 32 IU/L  Lipid Panel w/o Chol/HDL Ratio  Result Value Ref Range   Cholesterol, Total 165 100 - 199 mg/dL   Triglycerides 65 0 - 149 mg/dL   HDL 58 >13 mg/dL   VLDL Cholesterol Cal 13 5 - 40 mg/dL   LDL Chol Calc (  NIH) 94 0 - 99 mg/dL  TSH  Result Value Ref Range   TSH 1.060 0.450 - 4.500 uIU/mL  IgE  Result Value Ref Range   IgE (Immunoglobulin E), Serum 7 6 - 495 IU/mL  C-reactive protein  Result Value Ref Range   CRP 3 0 - 10 mg/L  Sed Rate (ESR)  Result Value Ref Range   Sed Rate 6 0 - 32 mm/hr  HgB A1c  Result Value Ref Range   Hgb A1c MFr Bld 5.2 4.8 - 5.6 %   Est. average glucose Bld gHb Est-mCnc 103 mg/dL  FSH/LH  Result Value Ref Range   LH 16.8 mIU/mL   FSH 14.4 mIU/mL  Estrogens, Total  Result Value Ref Range   Estrogen 248 pg/mL  QuantiFERON-TB Gold Plus  Result Value Ref Range   QuantiFERON Incubation Incubation performed.    QuantiFERON Criteria Comment    QuantiFERON TB1 Ag Value 0.07 IU/mL   QuantiFERON TB2 Ag Value 0.05 IU/mL   QuantiFERON Nil Value 0.07 IU/mL   QuantiFERON Mitogen Value >10.00 IU/mL   QuantiFERON-TB Gold Plus Negative Negative      Assessment & Plan:   Problem List Items Addressed This Visit       Musculoskeletal and Integument   Ehlers-Danlos syndrome type III - Primary     Chronic, ongoing .  Continue collaboration with ortho & cardiology, which has been beneficial for patient.          Other   B12 deficiency    Chronic, ongoing with recent level at [facility] 229.  She has not had benefit from oral supplement.  Send in injections for her to perform at home, she is comfortable with doing these as provides injections to her husband.        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HCC)    Has a referral placed per [facility] to see Dr. Livingston Diones for further work-up and evaluation for this.      Moderate episode of recurrent major depressive disorder (HCC)    Chronic, stable at this time.  Denies SI/HI.  Is seeing therapy, continue this relationship and Duloxetine for pain and mood. Will place referral for psychiatry once she alerts provider of who she would like this to go to.        Follow up plan: Return for as scheduled on October 16th.

## 2023-04-29 NOTE — Assessment & Plan Note (Signed)
Chronic, stable at this time.  Denies SI/HI.  Is seeing therapy, continue this relationship and Duloxetine for pain and mood. Will place referral for psychiatry once she alerts provider of who she would like this to go to.

## 2023-04-29 NOTE — Telephone Encounter (Signed)
Spoke with patient, informed her that I could speak with Dr. Selena Batten about referring her to a specialist for Mass Cell Activation. I also informed her that we could schedule her with another provider. Patient informed me that she appreciated the options however she has already seen another provider for this matter.

## 2023-04-29 NOTE — Assessment & Plan Note (Signed)
Chronic, ongoing .  Continue collaboration with ortho & cardiology, which has been beneficial for patient.

## 2023-05-05 DIAGNOSIS — R42 Dizziness and giddiness: Secondary | ICD-10-CM | POA: Diagnosis not present

## 2023-05-05 DIAGNOSIS — R5381 Other malaise: Secondary | ICD-10-CM | POA: Diagnosis not present

## 2023-05-05 DIAGNOSIS — R55 Syncope and collapse: Secondary | ICD-10-CM | POA: Diagnosis not present

## 2023-05-05 DIAGNOSIS — R002 Palpitations: Secondary | ICD-10-CM | POA: Diagnosis not present

## 2023-05-06 ENCOUNTER — Encounter: Payer: Self-pay | Admitting: Nurse Practitioner

## 2023-05-13 ENCOUNTER — Encounter: Payer: Self-pay | Admitting: Nurse Practitioner

## 2023-05-20 ENCOUNTER — Other Ambulatory Visit: Payer: Self-pay | Admitting: Nurse Practitioner

## 2023-05-20 NOTE — Telephone Encounter (Signed)
Requested Prescriptions  Pending Prescriptions Disp Refills   montelukast (SINGULAIR) 10 MG tablet [Pharmacy Med Name: MONTELUKAST SOD 10 MG TABLET] 90 tablet 1    Sig: TAKE 1 TABLET BY MOUTH EVERYDAY AT BEDTIME     Pulmonology:  Leukotriene Inhibitors Passed - 05/20/2023  2:18 AM      Passed - Valid encounter within last 12 months    Recent Outpatient Visits           3 weeks ago 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HCC)   [place] [facility] [place], [place] T, NP   2 months ago Moderate episode of recurrent major depressive disorder (HCC)   [place] [facility] [place], Corrie Dandy T, NP   7 months ago 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HCC)   [place] [facility] Mecum, Erin E, PA-C   10 months ago Dermatitis due to unknown cause   [place] [facility] Mecum, Erin E, PA-C   11 months ago Urinary symptom or sign   [place] [facility] [place], Dorie Rank, NP       Future Appointments             In 3 months Cannady, Dorie Rank, NP [place] [facility]             famotidine (PEPCID) 20 MG tablet [Pharmacy Med Name: FAMOTIDINE 20 MG TABLET] 180 tablet 0    Sig: TAKE 1 TABLET BY MOUTH TWICE A DAY     Gastroenterology:  H2 Antagonists Passed - 05/20/2023  2:18 AM      Passed - Valid encounter within last 12 months    Recent Outpatient Visits           3 weeks ago 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HCC)   [place] [facility] [place], Sylvester T, NP   2 months ago Moderate episode of recurrent major depressive disorder (HCC)   [place] [facility] [place], [name] T, NP   7 months ago 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HCC)   [place] [facility] Mecum, Erin E, PA-C   10 months ago Dermatitis due to unknown cause   [place] [facility] Mecum, Erin E, PA-C   11 months ago Urinary symptom or sign   [place] [facility] [place], Dorie Rank, NP       Future  Appointments             In 3 months Cannady, Dorie Rank, NP [place] [facility], [facility]

## 2023-05-26 DIAGNOSIS — I951 Orthostatic hypotension: Secondary | ICD-10-CM | POA: Diagnosis not present

## 2023-05-26 DIAGNOSIS — Q7962 Hypermobile Ehlers-Danlos syndrome: Secondary | ICD-10-CM | POA: Diagnosis not present

## 2023-05-26 DIAGNOSIS — I83893 Varicose veins of bilateral lower extremities with other complications: Secondary | ICD-10-CM | POA: Diagnosis not present

## 2023-05-27 ENCOUNTER — Encounter: Payer: Self-pay | Admitting: Family Medicine

## 2023-05-30 NOTE — Progress Notes (Unsigned)
   Rubin Payor, PhD, LAT, ATC acting as a scribe for Clementeen Graham, MD.  Stacy West is a 42 y.o. female who presents to [facility] today for thoracic back pain. Pt was previously seen by Dr. Denyse Amass on 01/06/23 R 2nd toe fx.  Today, pt c/o mid-back pain x ***. Pt sent a MyChart message on the 12th reporting worsening back pain and was advised to make an appointment. Pt locates pain to ***  Radiating pain: LE numbness/tingling: LE weakness: Aggravates: Treatments tried: traction, chiro  Pertinent review of systems: ***  Relevant historical information: ***   Exam:  There were no vitals taken for this visit. General: Well Developed, well nourished, and in no acute distress.   MSK: ***    Lab and Radiology Results No results found for this or any previous visit (from the past 72 hour(s)). No results found.     Assessment and Plan: 42 y.o. female with ***   PDMP not reviewed this encounter. No orders of the defined types were placed in this encounter.  No orders of the defined types were placed in this encounter.    Discussed warning signs or symptoms. Please see discharge instructions. Patient expresses understanding.   ***

## 2023-05-31 ENCOUNTER — Ambulatory Visit: Payer: 59 | Admitting: Family Medicine

## 2023-05-31 ENCOUNTER — Encounter: Payer: Self-pay | Admitting: Family Medicine

## 2023-05-31 ENCOUNTER — Ambulatory Visit (INDEPENDENT_AMBULATORY_CARE_PROVIDER_SITE_OTHER): Payer: 59

## 2023-05-31 VITALS — BP 112/84 | HR 96 | Ht 68.5 in | Wt 224.8 lb

## 2023-05-31 DIAGNOSIS — M5030 Other cervical disc degeneration, unspecified cervical region: Secondary | ICD-10-CM | POA: Diagnosis not present

## 2023-05-31 DIAGNOSIS — G8929 Other chronic pain: Secondary | ICD-10-CM

## 2023-05-31 DIAGNOSIS — M542 Cervicalgia: Secondary | ICD-10-CM | POA: Diagnosis not present

## 2023-05-31 DIAGNOSIS — M546 Pain in thoracic spine: Secondary | ICD-10-CM

## 2023-05-31 DIAGNOSIS — M47812 Spondylosis without myelopathy or radiculopathy, cervical region: Secondary | ICD-10-CM | POA: Diagnosis not present

## 2023-05-31 DIAGNOSIS — Z981 Arthrodesis status: Secondary | ICD-10-CM | POA: Diagnosis not present

## 2023-05-31 DIAGNOSIS — M419 Scoliosis, unspecified: Secondary | ICD-10-CM | POA: Diagnosis not present

## 2023-05-31 MED ORDER — DULOXETINE HCL 20 MG PO CPEP: 20.0000 mg | ORAL_CAPSULE | Freq: Every day | ORAL | 1 refills | Status: DC

## 2023-05-31 NOTE — Progress Notes (Signed)
Thoracic spine x-ray shows no fractures.  It does show little bit of scoliosis.

## 2023-05-31 NOTE — Patient Instructions (Addendum)
Thank you for coming in today.   The Pain Management Workbook: Powerful CBT and Mindfulness Skills to Take Control of Pain and Reclaim Your Life  ThisMLS.nl  Start cymbalta 20mg  daily.  Send me a mychart message in a 10 days and we can adjust the dose.   Consider dry needling   Please get an Xray today before you leave

## 2023-06-06 ENCOUNTER — Encounter: Payer: Self-pay | Admitting: Family Medicine

## 2023-06-06 NOTE — Progress Notes (Signed)
Cervical spine x-ray shows some arthritis in the areas of prior surgery.  No fractures.

## 2023-06-07 ENCOUNTER — Encounter: Payer: Self-pay | Admitting: Nurse Practitioner

## 2023-06-07 DIAGNOSIS — R41840 Attention and concentration deficit: Secondary | ICD-10-CM

## 2023-06-08 ENCOUNTER — Encounter: Payer: Self-pay | Admitting: Nurse Practitioner

## 2023-06-08 ENCOUNTER — Ambulatory Visit (INDEPENDENT_AMBULATORY_CARE_PROVIDER_SITE_OTHER): Payer: 59 | Admitting: Nurse Practitioner

## 2023-06-08 VITALS — BP 128/89 | HR 75 | Temp 98.5°F | Ht 68.5 in | Wt 222.8 lb

## 2023-06-08 DIAGNOSIS — M4134 Thoracogenic scoliosis, thoracic region: Secondary | ICD-10-CM | POA: Insufficient documentation

## 2023-06-08 DIAGNOSIS — G894 Chronic pain syndrome: Secondary | ICD-10-CM

## 2023-06-08 DIAGNOSIS — Q7962 Hypermobile Ehlers-Danlos syndrome: Secondary | ICD-10-CM | POA: Diagnosis not present

## 2023-06-08 DIAGNOSIS — Z3200 Encounter for pregnancy test, result unknown: Secondary | ICD-10-CM | POA: Diagnosis not present

## 2023-06-08 LAB — PREGNANCY, URINE: Preg Test, Ur: NEGATIVE

## 2023-06-08 NOTE — Assessment & Plan Note (Signed)
At this time continue medication regimen as ordered by ortho.  She does use MJ, which offers her benefit -- used this in [place].  Discussed with her regulations in [place], which she is aware of.  Referral to physiatry placed as she prefers a more natural approach vs medications.  Has performed PT and chiropractor care without benefit.

## 2023-06-08 NOTE — Progress Notes (Signed)
BP 128/89   Pulse 75   Temp 98.5 F (36.9 C) (Oral)   Ht 5' 8.5" (1.74 m)   Wt 222 lb 12.8 oz (101.1 kg)   LMP 05/12/2023   SpO2 99%   BMI 33.38 kg/m    Subjective:    Patient ID: Stacy West, female    DOB: 04/24/1981, 42 y.o.   MRN: [id number]  HPI: Stacy West is a 42 y.o. female  Chief Complaint  Patient presents with   Possible Pregnancy   She is concerned about her back, she has scoliosis and reports not being aware of this until now.  She has been doing exercises provided, these have helped everything but the T spine.  Pain is waking her up 4-5 times a night, has tried PT and harder mattresses.  Does PT every morning + attends chiropractor.  Taking Flexeril as needed.  Is trying to use holistic ways to deal with her chronic issues -- utilizing Keto diet, but does eat sweet potatoes and prunes.  Currently followed by orthopedics. Has frustrations at this time.  PREGNANCY DIAGNOSIS Gravida/Para: 5/1 Home pregnancy test: positive x2" "x3 -- faint positive Nausea: yes Vomiting: no Breast tenderness: yes Abdominal pain: no Vaginal bleeding: no Pregnancy or labor complications: yes --- needed progesterone while pregnant Fetal abnormalities: no Contraception:  Estrace  Relevant past medical, surgical, family and social history reviewed and updated as indicated. Interim medical history since our last visit reviewed. Allergies and medications reviewed and updated.  Review of Systems  Constitutional:  Negative for activity change, appetite change, diaphoresis, fatigue and fever.  Respiratory:  Negative for cough, chest tightness and shortness of breath.   Cardiovascular:  Negative for chest pain, palpitations and leg swelling.  Gastrointestinal:  Positive for nausea.  Musculoskeletal:  Positive for arthralgias.  Neurological: Negative.   Psychiatric/Behavioral:  Positive for decreased concentration. Negative for sleep disturbance and suicidal ideas. The patient is  nervous/anxious.     Per HPI unless specifically indicated above     Objective:    BP 128/89   Pulse 75   Temp 98.5 F (36.9 C) (Oral)   Ht 5' 8.5" (1.74 m)   Wt 222 lb 12.8 oz (101.1 kg)   LMP 05/12/2023   SpO2 99%   BMI 33.38 kg/m   Wt Readings from Last 3 Encounters:  06/08/23 222 lb 12.8 oz (101.1 kg)  05/31/23 224 lb 12.8 oz (102 kg)  04/29/23 224 lb 6.4 oz (101.8 kg)    Physical Exam Vitals and nursing note reviewed.  Constitutional:      General: She is awake. She is not in acute distress.    Appearance: She is well-developed and well-groomed. She is not ill-appearing or toxic-appearing.  HENT:     Head: Normocephalic.     Right Ear: Hearing and external ear normal.     Left Ear: Hearing and external ear normal.  Eyes:     General: Lids are normal.        Right eye: No discharge.        Left eye: No discharge.     Conjunctiva/sclera: Conjunctivae normal.     Pupils: Pupils are equal, round, and reactive to light.  Neck:     Thyroid: No thyromegaly.     Vascular: No carotid bruit.  Cardiovascular:     Rate and Rhythm: Normal rate and regular rhythm.     Heart sounds: Normal heart sounds. No murmur heard.    No gallop.  Pulmonary:  Effort: Pulmonary effort is normal. No accessory muscle usage or respiratory distress.     Breath sounds: Normal breath sounds.  Abdominal:     General: Bowel sounds are normal.     Palpations: Abdomen is soft. There is no hepatomegaly or splenomegaly.  Musculoskeletal:     Cervical back: Normal range of motion and neck supple.     Right lower leg: No edema.     Left lower leg: No edema.  Lymphadenopathy:     Cervical: No cervical adenopathy.  Skin:    General: Skin is warm and dry.  Neurological:     Mental Status: She is alert and oriented to person, place, and time.     Cranial Nerves: Cranial nerves 2-12 are intact.     Motor: Motor function is intact.     Coordination: Coordination is intact.     Gait: Gait is  intact.     Deep Tendon Reflexes:     Reflex Scores:      Brachioradialis reflexes are 1+ on the right side and 1+ on the left side.      Patellar reflexes are 1+ on the right side and 1+ on the left side. Psychiatric:        Attention and Perception: Attention normal.        Mood and Affect: Mood normal.        Speech: Speech normal.        Behavior: Behavior normal. Behavior is cooperative.        Thought Content: Thought content normal.     Results for orders placed or performed in visit on 06/08/23  Pregnancy, urine  Result Value Ref Range   Preg Test, Ur Negative Negative      Assessment & Plan:   Problem List Items Addressed This Visit       Musculoskeletal and Integument   Ehlers-Danlos syndrome type III    Chronic, ongoing .  Continue collaboration with ortho & cardiology, which has been beneficial for patient.  At this time continue medication regimen as ordered by ortho.  She does use MJ, which offers her benefit -- used this in [place].  Discussed with her regulations in [place], which she is aware of.  Referral to physiatry placed as she prefers a more natural approach vs medications.  Has performed PT and chiropractor care without benefit.      Relevant Orders   Ambulatory referral to Pain Clinic   AMB Referral to Chronic Care Management Services   Thoracogenic scoliosis of thoracic region    At this time continue medication regimen as ordered by ortho.  She does use MJ, which offers her benefit -- used this in [place].  Discussed with her regulations in [place], which she is aware of.  Referral to physiatry placed as she prefers a more natural approach vs medications.  Has performed PT and chiropractor care without benefit.      Relevant Orders   Ambulatory referral to Pain Clinic   AMB Referral to Chronic Care Management Services     Other   Chronic pain syndrome    Chronic, ongoing.  At this time continue medication regimen as ordered by ortho.  She does use MJ, which offers  her benefit -- used this in [place].  Discussed with her regulations in [place], which she is aware of.  Referral to physiatry placed as she prefers a more natural approach vs medications.  Has performed PT and chiropractor care without benefit.  Relevant Orders   Ambulatory referral to Pain Clinic   AMB Referral to Chronic Care Management Services   Possible pregnancy - Primary    Urine testing negative -- will obtain blood work as well to confirm.      Relevant Orders   Pregnancy, urine (Completed)   Beta hCG quant (ref lab)     Follow up plan: Return if symptoms worsen or fail to improve.

## 2023-06-08 NOTE — Assessment & Plan Note (Signed)
Chronic, ongoing .  Continue collaboration with ortho & cardiology, which has been beneficial for patient.  At this time continue medication regimen as ordered by ortho.  She does use MJ, which offers her benefit -- used this in [place].  Discussed with her regulations in [place], which she is aware of.  Referral to physiatry placed as she prefers a more natural approach vs medications.  Has performed PT and chiropractor care without benefit.

## 2023-06-08 NOTE — Assessment & Plan Note (Signed)
Urine testing negative -- will obtain blood work as well to confirm.

## 2023-06-08 NOTE — Assessment & Plan Note (Signed)
Chronic, ongoing.  At this time continue medication regimen as ordered by ortho.  She does use MJ, which offers her benefit -- used this in [place].  Discussed with her regulations in [place], which she is aware of.  Referral to physiatry placed as she prefers a more natural approach vs medications.  Has performed PT and chiropractor care without benefit.

## 2023-06-08 NOTE — Patient Instructions (Signed)
Ehlers-Danlos Syndrome Ehlers-Danlos syndrome is a group of genetic disorders that affect the body's connective tissues, which are made up of proteins. Connective tissues give support and elasticity to the skin, bones, joints, blood vessels, and internal organs. These disorders cause connective tissues to become weak, fragile, and stretchable. People with these disorders often have overly flexible joints and stretchy, fragile skin. Ehlers-Danlos syndrome has 13 types, but many of those are rare. The most common types affect joints and skin. Other types affect the eyes, spine, or heart valves. The most severe types affect the walls of blood vessels and the structures of internal organs. What are the causes? This condition is caused by abnormal genes (genetic defects). Some types are passed from parent to child (inherited), and others are not. Sometimes, the genes can become defective during development in the womb. What are the signs or symptoms? The signs and symptoms of this condition depend on the type. Signs and symptoms may start in childhood or later in life. Signs and symptoms of the common types include: Joint and muscle symptoms Overly flexible joints that have a wide range of motion and can become displaced easily. Joint pain. Weak, floppy muscles. Skin symptoms Velvety, soft skin that stretches and sags. Skin that bruises and tears easily. Skin tears that are hard to repair with stitches (sutures) and that heal with wide scars. Lumps under the skin. Vision symptoms Tearing of fibers of the eyeball or tearing of the tissue inside the eye (retinal detachment). Changes in vision, including seeing floaters or flashes, or having sudden vision loss. Vascular symptoms Bleeding from tears of internal organs or blood vessels. Weak heart valves causing weakening of the heart. Orthopedic symptoms A curved spine, which can cause trouble breathing. Bowed limbs and short stature. Muscle or  tendon tears. Weak, thin bones. A club foot. Other symptoms A collapsed lung. Unusual facial features, such as a thin nose and lips, sunken cheeks, small earlobes, and prominent eyes. How is this diagnosed? This condition may be diagnosed based on symptoms and medical history, especially if the condition runs in your family. Your health care provider will also do a physical exam. You may also have tests, such as: Blood tests to check for genetic defects. Tissue tests to check for abnormal body proteins (collagen). X-rays of your spine. Imaging of internal organs, such as an MRI or a CT scan. A bone scan. Heart ultrasound (echocardiogram) to look for heart valve problems. How is this treated? There is no cure for this condition, but treatment can help symptoms and prevent complications. Treatment depends on the type of condition you have and the symptoms it causes. Treatment options can include: Over-the-counter pain medicines. Vitamin C to strengthen skin. Vitamin D and calcium to strengthen bones. Blood pressure medicines to prevent blood vessel tears. Physical therapy exercises to improve movement and strength in your muscles and joints. Assistive devices that help you move, such as braces or a wheelchair, a walker, or a scooter. Surgery to repair wounds, tears, or injured joints. Follow these instructions at home: Activity Do exercises as told by your health care provider. Return to your normal activities as told by your health care provider. Ask your health care provider what activities are safe for you. You may need to avoid doing contact sports and heavy lifting. General instructions  Take over-the-counter and prescription medicines only as told by your health care provider. Let your health care provider know if you become pregnant. You will need special care to prevent  tearing of the womb. Tell all health care providers that you have this condition before you have any surgery  or procedure. Wear a medical alert bracelet. Schedule eye exams often. Consider having genetic counseling to learn if you may pass the condition on to your children. Do not use any products that contain nicotine or tobacco. These products include cigarettes, chewing tobacco, and vaping devices, such as e-cigarettes. If you need help quitting, ask your health care provider. Keep all follow-up visits. This is important. Where to find more information National Organization for Rare Disorders: rarediseases.org Contact a health care provider if: You have joint pain. You cut or tear your skin. You have weakness or shortness of breath. You have any new symptoms. Get help right away if you have: Any sudden and severe pain. Chest pain or trouble breathing. Sudden vision loss, or you see floaters or flashes. These symptoms may represent a serious problem that is an emergency. Do not wait to see if the symptoms will go away. Get medical help right away. Call your local emergency services (911 in the [place] Do not drive yourself to the hospital. Summary Ehlers-Danlos syndrome is a group of genetic disorders that affect the connective tissues of the body. This condition is caused by abnormal genes (genetic defects). Symptoms range from overly flexible joints and fragile skin to very serious complications, such as blood vessel or organ tears. There is no cure for this condition, but treatment can relieve some symptoms and prevent complications. This information is not intended to replace advice given to you by your health care provider. Make sure you discuss any questions you have with your health care provider. Document Revised: 02/11/2021 Document Reviewed: 02/11/2021 Elsevier Patient Education  2024 [employer].

## 2023-06-09 LAB — BETA HCG QUANT (REF LAB): hCG Quant: 3 m[IU]/mL

## 2023-06-09 NOTE — Progress Notes (Signed)
Contacted via MyChart   Good morning Stacy West, your blood work returned and shows no pregnancy.  Level is between 0 and 5, meaning non pregnant.  I am sorry:(

## 2023-06-14 ENCOUNTER — Telehealth: Payer: Self-pay | Admitting: *Deleted

## 2023-06-14 ENCOUNTER — Telehealth: Payer: Self-pay | Admitting: Nurse Practitioner

## 2023-06-14 ENCOUNTER — Other Ambulatory Visit: Payer: Self-pay | Admitting: Nurse Practitioner

## 2023-06-14 DIAGNOSIS — F331 Major depressive disorder, recurrent, moderate: Secondary | ICD-10-CM

## 2023-06-14 DIAGNOSIS — M797 Fibromyalgia: Secondary | ICD-10-CM

## 2023-06-14 NOTE — Progress Notes (Signed)
  Care Coordination  Outreach Note  06/14/2023 Name: Stacy West MRN: [id number] DOB: 01/13/1981   Care Coordination Outreach Attempts: An unsuccessful telephone outreach was attempted today to offer the patient information about available care coordination services.  Follow Up Plan:  Additional outreach attempts will be made to offer the patient care coordination information and services.   Encounter Outcome:  No Answer  Burman Nieves, CCMA Care Coordination Care Guide Direct Dial: [phone]

## 2023-06-14 NOTE — Progress Notes (Signed)
  Care Coordination   Note   06/14/2023 Name: Kymira Therien MRN: [id number] DOB: 06-23-1981  Adriana Mckinsey is a 43 y.o. year old female who sees Aura Dials T, NP for primary care. I reached out to Clarene Reamer by phone today to offer care coordination services.  Ms. Tak was given information about Care Coordination services today including:   The Care Coordination services include support from the care team which includes your Nurse Coordinator, Clinical Social Worker, or Pharmacist.  The Care Coordination team is here to help remove barriers to the health concerns and goals most important to you. Care Coordination services are voluntary, and the patient may decline or stop services at any time by request to their care team member.   Care Coordination Consent Status: Patient agreed to services and verbal consent obtained.   Follow up plan:  Telephone appointment with care coordination team member scheduled for:  06/15/2023  Encounter Outcome:  Pt. Scheduled from referral   Burman Nieves, [facility] Care Coordination Care Guide Direct Dial: [phone]

## 2023-06-14 NOTE — Telephone Encounter (Signed)
Copied from CRM [id number]. Topic: Referral - Status >> Jun 14, 2023  9:09 AM Everette C wrote: Reason for CRM: The patient would like for their referral request for a social worker to be expedited if possible prior due to their ongoing concerns/issues with medicaid   Please contact further when possible

## 2023-06-15 ENCOUNTER — Encounter: Payer: Self-pay | Admitting: Nurse Practitioner

## 2023-06-15 ENCOUNTER — Ambulatory Visit: Payer: Self-pay
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
## 2023-06-16 ENCOUNTER — Telehealth: Payer: Self-pay | Admitting: Nurse Practitioner

## 2023-06-16 DIAGNOSIS — N912 Amenorrhea, unspecified: Secondary | ICD-10-CM

## 2023-06-16 NOTE — Telephone Encounter (Signed)
Routing to provider and referral coordinator for patient's concerns.

## 2023-06-16 NOTE — Telephone Encounter (Addendum)
Patient called and stated she was told by Stacy West that if she did not get her period by today (06/16/2023), that [name],Stacy West would send Lab orders for patient. Patient is requesting lab orders to be sent to [facility] [place]? Please advise.    Also patient stated the referral that was sent for ADHD & autism, the practice does not accept patients insurance and she needs this referral sent to somewhere that accepts Medicare & Medicaid. Please advise.   Patients callback # [phone]

## 2023-06-16 NOTE — Telephone Encounter (Signed)
Please let patient know that Corrie Dandy is out of the office and I'm not sure what labs she was going to order for her. She will be back on Tuesday and should be able to get those ordered, unless she told her what they were going to do

## 2023-06-23 ENCOUNTER — Telehealth: Payer: Self-pay | Admitting: Nurse Practitioner

## 2023-06-23 DIAGNOSIS — Z1231 Encounter for screening mammogram for malignant neoplasm of breast: Secondary | ICD-10-CM

## 2023-06-23 NOTE — Telephone Encounter (Signed)
Copied from CRM [id number]. Topic: Referral - Request for Referral >> Jun 22, 2023  1:51 PM Turkey B wrote: Reason for CRM: referral  Has patient seen PCP for this complaint? yes *If NO, is insurance requiring patient see PCP for this issue before PCP can refer them? Referral for which specialty: ? Preferred provider/office: ? Reason for referral: hormone replacement therapy/ and wants mammo also

## 2023-06-24 ENCOUNTER — Ambulatory Visit: Payer: Self-pay
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
## 2023-06-28 ENCOUNTER — Encounter: Payer: Self-pay | Admitting: Nurse Practitioner

## 2023-06-28 DIAGNOSIS — M5441 Lumbago with sciatica, right side: Secondary | ICD-10-CM | POA: Diagnosis not present

## 2023-06-28 DIAGNOSIS — M5442 Lumbago with sciatica, left side: Secondary | ICD-10-CM | POA: Diagnosis not present

## 2023-06-28 DIAGNOSIS — M5414 Radiculopathy, thoracic region: Secondary | ICD-10-CM | POA: Diagnosis not present

## 2023-06-28 DIAGNOSIS — G8929 Other chronic pain: Secondary | ICD-10-CM | POA: Diagnosis not present

## 2023-06-28 DIAGNOSIS — M542 Cervicalgia: Secondary | ICD-10-CM | POA: Diagnosis not present

## 2023-06-28 DIAGNOSIS — M546 Pain in thoracic spine: Secondary | ICD-10-CM | POA: Diagnosis not present

## 2023-06-29 ENCOUNTER — Telehealth: Payer: Self-pay

## 2023-06-29 NOTE — Telephone Encounter (Signed)
I don't blame you, I wouldn't either because that's all you would be doing lol.  Yeah I knew that you all don't give out emails but I wasn't sure about the message in general.

## 2023-06-29 NOTE — Telephone Encounter (Signed)
Answer Assessment - Initial Assessment Questions 1. REASON FOR CALL or QUESTION: "What is your reason for calling today?" or "How can I best help you?" or "What question do you have that I can help answer?"     See note  Protocols used: Information Only Call - No Triage-A-AH  This encounter was created in error - please disregard.

## 2023-06-29 NOTE — Telephone Encounter (Signed)
Mother called asking if completed CAP form that  can be sent to her in MyChart.  Pt stated she is very frustrated and mentioned conflict between her  and her spouse. Frustration with the cost and waiting list. Pt repeatedly asked for PCP email.

## 2023-07-01 ENCOUNTER — Other Ambulatory Visit: Payer: Self-pay | Admitting: Nurse Practitioner

## 2023-07-01 DIAGNOSIS — D8940 Mast cell activation, unspecified: Secondary | ICD-10-CM

## 2023-07-01 NOTE — Telephone Encounter (Signed)
Requested Prescriptions  Pending Prescriptions Disp Refills   cetirizine (ZYRTEC) 10 MG tablet [Pharmacy Med Name: CETIRIZINE HCL 10 MG TABLET] 90 tablet 0    Sig: TAKE 1 TABLET BY MOUTH EVERY DAY     Ear, Nose, and Throat:  Antihistamines 2 Passed - 07/01/2023  1:29 AM      Passed - Cr in normal range and within 360 days    Creatinine, Ser  Date Value Ref Range Status  02/28/2023 0.77 0.57 - 1.00 mg/dL Final         Passed - Valid encounter within last 12 months    Recent Outpatient Visits           3 weeks ago Possible pregnancy   [facility] [place], [place] T, NP   2 months ago 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HCC)   [place] [facility] [place], [place] T, NP   4 months ago Moderate episode of recurrent major depressive disorder (HCC)   [place] [facility] [place], Corrie Dandy T, NP   8 months ago 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HCC)   [place] [facility] [place], Oswaldo Conroy, PA-C   1 year ago Dermatitis due to unknown cause   [place] [facility] Mecum, Oswaldo Conroy, PA-C       Future Appointments             In 2 months Cannady, Dorie Rank, NP [place] [facility]

## 2023-07-02 ENCOUNTER — Other Ambulatory Visit: Payer: Self-pay | Admitting: Nurse Practitioner

## 2023-07-04 NOTE — Telephone Encounter (Signed)
Requested Prescriptions  Pending Prescriptions Disp Refills   pantoprazole (PROTONIX) 40 MG tablet [Pharmacy Med Name: PANTOPRAZOLE SOD DR 40 MG TAB] 90 tablet 3    Sig: TAKE 1 TABLET BY MOUTH EVERY DAY     Gastroenterology: Proton Pump Inhibitors Passed - 07/02/2023  8:26 AM      Passed - Valid encounter within last 12 months    Recent Outpatient Visits           3 weeks ago Possible pregnancy   [place] [facility] [place], [place] T, NP   2 months ago 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HCC)   [place] [facility] [place], [place] T, NP   4 months ago Moderate episode of recurrent major depressive disorder (HCC)   [place] [facility] [place], Corrie Dandy T, NP   8 months ago 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HCC)   [place] [facility] Mecum, Oswaldo Conroy, PA-C   1 year ago Dermatitis due to unknown cause   [place] [facility] Mecum, Oswaldo Conroy, PA-C       Future Appointments             In 1 month Cannady, Dorie Rank, NP [place] [facility]

## 2023-07-05 ENCOUNTER — Other Ambulatory Visit: Payer: Self-pay | Admitting: Nurse Practitioner

## 2023-07-05 ENCOUNTER — Ambulatory Visit
Admission: RE | Admit: 2023-07-05 | Discharge: 2023-07-05 | Disposition: A | Discharge status: Home / Self Care | Payer: 59 | Source: Ambulatory Visit | Attending: Nurse Practitioner | Admitting: Nurse Practitioner

## 2023-07-05 DIAGNOSIS — Z1231 Encounter for screening mammogram for malignant neoplasm of breast: Secondary | ICD-10-CM | POA: Insufficient documentation

## 2023-07-05 NOTE — Telephone Encounter (Signed)
Medication Refill - Medication: cyanocobalamin (VITAMIN B12) 1000 MCG/ML injection   Has the patient contacted their pharmacy? No.   Preferred Pharmacy (with phone number or street name):  [id number] [place] [address] Phone: [phone]  Fax: [phone]     Has the patient been seen for an appointment in the last year OR does the patient have an upcoming appointment? Yes.    Agent: Please be advised that RX refills may take up to 3 business days. We ask that you follow-up with your pharmacy.

## 2023-07-06 MED ORDER — CYANOCOBALAMIN 1000 MCG/ML IJ SOLN: 1000.0000 ug | INTRAMUSCULAR | 2 refills | Status: DC

## 2023-07-06 NOTE — Progress Notes (Signed)
Contacted via MyChart   Normal mammogram, may repeat in one year:)

## 2023-07-06 NOTE — Telephone Encounter (Signed)
Requested Prescriptions  Pending Prescriptions Disp Refills   cyanocobalamin (VITAMIN B12) 1000 MCG/ML injection 30 mL 2    Sig: Inject 1 mL (1,000 mcg total) into the muscle every 30 (thirty) days.     Endocrinology:  Vitamins - Vitamin B12 Failed - 07/05/2023  9:50 AM      Failed - B12 Level in normal range and within 360 days    Vitamin B-12  Date Value Ref Range Status  12/25/2021 527 232 - 1,245 pg/mL Final         Passed - HCT in normal range and within 360 days    Hematocrit  Date Value Ref Range Status  02/28/2023 40.5 34.0 - 46.6 % Final         Passed - HGB in normal range and within 360 days    Hemoglobin  Date Value Ref Range Status  02/28/2023 13.8 11.1 - 15.9 g/dL Final         Passed - Valid encounter within last 12 months    Recent Outpatient Visits           4 weeks ago Possible pregnancy   [place] [facility] [place], [place] T, NP   2 months ago 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HCC)   [place] [facility] [place], [place] T, NP   4 months ago Moderate episode of recurrent major depressive disorder (HCC)   [place] [facility] [place], Corrie Dandy T, NP   8 months ago 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HCC)   [place] [facility] Mecum, Oswaldo Conroy, PA-C   1 year ago Dermatitis due to unknown cause    [facility] Mecum, Oswaldo Conroy, PA-C       Future Appointments             In 1 month Cannady, Dorie Rank, NP  [facility], [facility]

## 2023-07-11 ENCOUNTER — Encounter: Payer: Self-pay | Admitting: Nurse Practitioner

## 2023-07-12 ENCOUNTER — Telehealth: Payer: Self-pay | Admitting: Nurse Practitioner

## 2023-07-18 ENCOUNTER — Encounter: Payer: Self-pay | Admitting: Family Medicine

## 2023-07-19 NOTE — Telephone Encounter (Signed)
Rx not on current med list. Controlled substance, forwarding to Dr. Denyse Amass.

## 2023-07-21 NOTE — Telephone Encounter (Signed)
Rx for Lyrica 50 mg night pending.   Controlled substance, forwarding to Dr. Denyse Amass.

## 2023-07-21 NOTE — Addendum Note (Signed)
Addended by: Dierdre Searles on: 07/21/2023 10:36 AM   Modules accepted: Orders

## 2023-07-22 MED ORDER — PREGABALIN 50 MG PO CAPS: 50.0000 mg | ORAL_CAPSULE | Freq: Every day | ORAL | 2 refills | Status: AC

## 2023-08-11 DIAGNOSIS — D8943 Secondary mast cell activation: Secondary | ICD-10-CM | POA: Diagnosis not present

## 2023-08-11 DIAGNOSIS — G909 Disorder of the autonomic nervous system, unspecified: Secondary | ICD-10-CM | POA: Diagnosis not present

## 2023-08-11 DIAGNOSIS — D6859 Other primary thrombophilia: Secondary | ICD-10-CM | POA: Diagnosis not present

## 2023-08-18 ENCOUNTER — Encounter: Payer: Self-pay | Admitting: Student in an Organized Health Care Education/Training Program

## 2023-08-18 ENCOUNTER — Ambulatory Visit
Payer: Medicare Other | Attending: Student in an Organized Health Care Education/Training Program | Admitting: Student in an Organized Health Care Education/Training Program

## 2023-08-18 VITALS — BP 115/83 | HR 88 | Temp 97.9°F | Resp 16 | Ht 71.0 in | Wt 220.0 lb

## 2023-08-18 DIAGNOSIS — M5134 Other intervertebral disc degeneration, thoracic region: Secondary | ICD-10-CM | POA: Diagnosis not present

## 2023-08-18 DIAGNOSIS — M546 Pain in thoracic spine: Secondary | ICD-10-CM | POA: Diagnosis not present

## 2023-08-18 DIAGNOSIS — M542 Cervicalgia: Secondary | ICD-10-CM | POA: Diagnosis not present

## 2023-08-18 DIAGNOSIS — M5412 Radiculopathy, cervical region: Secondary | ICD-10-CM | POA: Diagnosis not present

## 2023-08-18 DIAGNOSIS — M5481 Occipital neuralgia: Secondary | ICD-10-CM

## 2023-08-18 DIAGNOSIS — G8929 Other chronic pain: Secondary | ICD-10-CM

## 2023-08-18 DIAGNOSIS — G894 Chronic pain syndrome: Secondary | ICD-10-CM

## 2023-08-18 DIAGNOSIS — Q761 Klippel-Feil syndrome: Secondary | ICD-10-CM | POA: Insufficient documentation

## 2023-08-18 DIAGNOSIS — Z981 Arthrodesis status: Secondary | ICD-10-CM

## 2023-08-18 DIAGNOSIS — Q7962 Hypermobile Ehlers-Danlos syndrome: Secondary | ICD-10-CM | POA: Diagnosis not present

## 2023-08-18 NOTE — Progress Notes (Signed)
Safety precautions to be maintained throughout the outpatient stay will include: orient to surroundings, keep bed in low position, maintain call bell within reach at all times, provide assistance with transfer out of bed and ambulation.  

## 2023-08-18 NOTE — Progress Notes (Signed)
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 tests) Review of prior analgesic pharmacotherapies. Reviewing PMP Interpreting ordered tests (e.g., lab work, imaging, nerve conduction tests) Performing post-procedure evaluations, including interpretation of diagnostic procedures Obtaining and/or reviewing separately obtained history Performing a medically appropriate examination and/or evaluation Counseling and educating the patient/family/caregiver Ordering medications, tests, or procedures Referring and communicating with other health care professionals (when not separately reported) Documenting clinical information in the electronic or other health record Independently interpreting results (not separately reported) and communicating results to the patient/ family/caregiver Care coordination (not separately  reported)  Note by: Edward Jolly, MD (TTS technology used. I apologize for any typographical errors that were not detected and corrected.) Date: 08/18/2023; Time: 3:12 PM

## 2023-08-20 ENCOUNTER — Ambulatory Visit
Admission: RE | Admit: 2023-08-20 | Discharge: 2023-08-20 | Disposition: A | Discharge status: Home / Self Care | Payer: Medicare Other | Source: Ambulatory Visit | Attending: Student in an Organized Health Care Education/Training Program | Admitting: Student in an Organized Health Care Education/Training Program

## 2023-08-20 ENCOUNTER — Other Ambulatory Visit: Payer: Self-pay | Admitting: Nurse Practitioner

## 2023-08-20 DIAGNOSIS — M5134 Other intervertebral disc degeneration, thoracic region: Secondary | ICD-10-CM | POA: Diagnosis not present

## 2023-08-20 DIAGNOSIS — M542 Cervicalgia: Secondary | ICD-10-CM

## 2023-08-20 DIAGNOSIS — M5412 Radiculopathy, cervical region: Secondary | ICD-10-CM | POA: Insufficient documentation

## 2023-08-20 DIAGNOSIS — G8929 Other chronic pain: Secondary | ICD-10-CM

## 2023-08-20 DIAGNOSIS — M4322 Fusion of spine, cervical region: Secondary | ICD-10-CM | POA: Diagnosis not present

## 2023-08-20 DIAGNOSIS — Q761 Klippel-Feil syndrome: Secondary | ICD-10-CM | POA: Diagnosis not present

## 2023-08-20 DIAGNOSIS — Z981 Arthrodesis status: Secondary | ICD-10-CM

## 2023-08-20 DIAGNOSIS — Q796 Ehlers-Danlos syndrome, unspecified: Secondary | ICD-10-CM | POA: Diagnosis not present

## 2023-08-20 DIAGNOSIS — M47812 Spondylosis without myelopathy or radiculopathy, cervical region: Secondary | ICD-10-CM | POA: Diagnosis not present

## 2023-08-20 DIAGNOSIS — M546 Pain in thoracic spine: Secondary | ICD-10-CM | POA: Insufficient documentation

## 2023-08-22 ENCOUNTER — Encounter: Payer: Self-pay | Admitting: Nurse Practitioner

## 2023-08-22 ENCOUNTER — Encounter: Payer: Self-pay | Admitting: Student in an Organized Health Care Education/Training Program

## 2023-08-22 NOTE — Telephone Encounter (Signed)
Requested Prescriptions  Pending Prescriptions Disp Refills   famotidine (PEPCID) 20 MG tablet [Pharmacy Med Name: FAMOTIDINE 20 MG TABLET] 180 tablet 0    Sig: TAKE 1 TABLET BY MOUTH TWICE A DAY     Gastroenterology:  H2 Antagonists Passed - 08/20/2023  8:52 AM      Passed - Valid encounter within last 12 months    Recent Outpatient Visits           2 months ago Possible pregnancy   [place] [facility] [place], [place] T, NP   3 months ago 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HCC)   [place] [facility] [place], [place] T, NP   5 months ago Moderate episode of recurrent major depressive disorder (HCC)   [place] [facility] [place], Corrie Dandy T, NP   10 months ago 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HCC)   [place] [facility] [place], Oswaldo Conroy, PA-C   1 year ago Dermatitis due to unknown cause   [place] [facility] [place], Oswaldo Conroy, PA-C       Future Appointments             In 1 week Cannady, Corrie Dandy T, NP [place] [facility], [facility]             montelukast (SINGULAIR) 10 MG tablet [Pharmacy Med Name: MONTELUKAST SOD 10 MG TABLET] 90 tablet 0    Sig: TAKE 1 TABLET BY MOUTH EVERYDAY AT BEDTIME     Pulmonology:  Leukotriene Inhibitors Passed - 08/20/2023  8:52 AM      Passed - Valid encounter within last 12 months    Recent Outpatient Visits           2 months ago Possible pregnancy   [place] [facility] [place], [place] T, NP   3 months ago 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HCC)   [place] [facility] [place], [place] T, NP   5 months ago Moderate episode of recurrent major depressive disorder (HCC)   [place] [facility] [place], Corrie Dandy T, NP   10 months ago 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HCC)   [place] [facility] [place], Oswaldo Conroy, PA-C   1 year ago Dermatitis due to unknown cause    [facility] [place], Oswaldo Conroy, PA-C       Future Appointments              In 1 week Cannady, Dorie Rank, NP  [facility], [facility]

## 2023-08-28 NOTE — Patient Instructions (Signed)
Managing Anxiety, Adult  After being diagnosed with anxiety, you may be relieved to know why you have felt or behaved a certain way. You may also feel overwhelmed about the treatment ahead and what it will mean for your life. With care and support, you can manage your anxiety.  How to manage lifestyle changes  Understanding the difference between stress and anxiety  Although stress can play a role in anxiety, it is not the same as anxiety. Stress is your body's reaction to life changes and events, both good and bad. Stress is often caused by something external, such as a deadline, test, or competition. It normally goes away after the event has ended and will last just a few hours. But, stress can be ongoing and can lead to more than just stress.  Anxiety is caused by something internal, such as imagining a terrible outcome or worrying that something will go wrong that will greatly upset you. Anxiety often does not go away even after the event is over, and it can become a long-term (chronic) worry.  Lowering stress and anxiety    Talk with your health care provider or a counselor to learn more about lowering anxiety and stress. They may suggest tension-reduction techniques, such as:  Music. Spend time creating or listening to music that you enjoy and that inspires you.  Mindfulness-based meditation. Practice being aware of your normal breaths while not trying to control your breathing. It can be done while sitting or walking.  Centering prayer. Focus on a word, phrase, or sacred image that means something to you and brings you peace.  Deep breathing. Expand your stomach and inhale slowly through your nose. Hold your breath for 3-5 seconds. Then breathe out slowly, letting your stomach muscles relax.  Self-talk. Learn to notice and spot thought patterns that lead to anxiety reactions. Change those patterns to thoughts that feel peaceful.  Muscle relaxation. Take time to tense muscles and then relax them.  Choose a  tension-reduction technique that fits your lifestyle and personality. These techniques take time and practice. Set aside 5-15 minutes a day to do them. Specialized therapists can offer counseling and training in these techniques. The training to help with anxiety may be covered by some insurance plans.  Other things you can do to manage stress and anxiety include:  Keeping a stress diary. This can help you learn what triggers your reaction and then learn ways to manage your response.  Thinking about how you react to certain situations. You may not be able to control everything, but you can control your response.  Making time for activities that help you relax and not feeling guilty about spending your time in this way.  Doing visual imagery. This involves imagining or creating mental pictures to help you relax.  Practicing yoga. Through yoga poses, you can lower tension and relax.     Medicines  Medicines for anxiety include:  Antidepressant medicines. These are usually prescribed for long-term daily control.  Anti-anxiety medicines. These may be added in severe cases, especially when panic attacks occur.  When used together, medicines, psychotherapy, and tension-reduction techniques may be the most effective treatment.  Relationships  Relationships can play a big part in helping you recover. Spend more time connecting with trusted friends and family members. Think about going to couples counseling if you have a partner, taking family education classes, or going to family therapy. Therapy can help you and others better understand your anxiety.  How to recognize changes in  your anxiety  Everyone responds differently to treatment for anxiety. Recovery from anxiety happens when symptoms lessen and stop interfering with your daily life at home or work. This may mean that you will start to:  Have better concentration and focus. Worry will interfere less in your daily thinking.  Sleep better.  Be less irritable.  Have  more energy.  Have improved memory.  Try to recognize when your condition is getting worse. Contact your provider if your symptoms interfere with home or work and you feel like your condition is not improving.  Follow these instructions at home:  Activity  Exercise. Adults should:  Exercise for at least 150 minutes each week. The exercise should increase your heart rate and make you sweat (moderate-intensity exercise).  Do strengthening exercises at least twice a week.  Get the right amount and quality of sleep. Most adults need 7-9 hours of sleep each night.  Lifestyle    Eat a healthy diet that includes plenty of vegetables, fruits, whole grains, low-fat dairy products, and lean protein.  Do not eat a lot of foods that are high in fats, added sugars, or salt (sodium).  Make choices that simplify your life.  Do not use any products that contain nicotine or tobacco. These products include cigarettes, chewing tobacco, and vaping devices, such as e-cigarettes. If you need help quitting, ask your provider.  Avoid caffeine, alcohol, and certain over-the-counter cold medicines. These may make you feel worse. Ask your pharmacist which medicines to avoid.  General instructions  Take over-the-counter and prescription medicines only as told by your provider.  Keep all follow-up visits. This is to make sure you are managing your anxiety well or if you need more support.  Where to find support  You can get help and support from:  Self-help groups.  Online and [employer].  A trusted spiritual leader.  Couples counseling.  Family education classes.  Family therapy.  Where to find more information  You may find that joining a support group helps you deal with your anxiety. The following sources can help you find counselors or support groups near you:  Mental Health America: mentalhealthamerica.net  Anxiety and Depression Association of [place] (ADAA): adaa.org  The [employer] on Mental Illness (NAMI):  nami.org  Contact a health care provider if:  You have a hard time staying focused or finishing tasks.  You spend many hours a day feeling worried about everyday life.  You are very tired because you cannot stop worrying.  You start to have headaches or often feel tense.  You have chronic nausea or diarrhea.  Get help right away if:  Your heart feels like it is racing.  You have shortness of breath.  You have thoughts of hurting yourself or others.  Get help right away if you feel like you may hurt yourself or others, or have thoughts about taking your own life. Go to your nearest emergency room or:  Call 911.  Call the National Suicide Prevention Lifeline at 715-005-1913 or 988. This is open 24 hours a day.  Text the Crisis Text Line at (250) 302-5959.  This information is not intended to replace advice given to you by your health care provider. Make sure you discuss any questions you have with your health care provider.  Document Revised: 08/10/2022 Document Reviewed: 02/22/2021  Elsevier Patient Education  2024 [employer].

## 2023-08-31 ENCOUNTER — Encounter: Payer: Self-pay | Admitting: Nurse Practitioner

## 2023-08-31 ENCOUNTER — Telehealth (INDEPENDENT_AMBULATORY_CARE_PROVIDER_SITE_OTHER): Payer: Medicare Other | Admitting: Nurse Practitioner

## 2023-08-31 ENCOUNTER — Telehealth: Payer: Self-pay | Admitting: *Deleted

## 2023-08-31 VITALS — BP 125/84 | HR 84 | Ht 70.0 in | Wt 220.0 lb

## 2023-08-31 DIAGNOSIS — F331 Major depressive disorder, recurrent, moderate: Secondary | ICD-10-CM | POA: Diagnosis not present

## 2023-08-31 DIAGNOSIS — Q7962 Hypermobile Ehlers-Danlos syndrome: Secondary | ICD-10-CM | POA: Diagnosis not present

## 2023-08-31 NOTE — Assessment & Plan Note (Signed)
Chronic, ongoing .  Continue collaboration with ortho, pain management, & cardiology.  At this time continue medication regimen as ordered by specialists.  She does use MJ, which offers her benefit -- used this in [place].  Discussed with her regulations in [place], which she is aware of.

## 2023-08-31 NOTE — Progress Notes (Signed)
  Care Coordination   Note   08/31/2023 Name: Danashia Landers MRN: [id number] DOB: [birth year]  Omara Alcon is a 42 y.o. year old female who sees Aura Dials T, NP for primary care. I reached out to Clarene Reamer by phone today to offer care coordination services.  Ms. Mansouri was given information about Care Coordination services today including:   The Care Coordination services include support from the care team which includes your Nurse Coordinator, Clinical Social Worker, or Pharmacist.  The Care Coordination team is here to help remove barriers to the health concerns and goals most important to you. Care Coordination services are voluntary, and the patient may decline or stop services at any time by request to their care team member.   Care Coordination Consent Status: Patient agreed to services and verbal consent obtained.   Follow up plan:  Telephone appointment with care coordination team member scheduled for:  09/09/2023  Encounter Outcome:  Patient Scheduled from referral   Burman Nieves, [facility] Care Coordination Care Guide Direct Dial: [phone]

## 2023-08-31 NOTE — Progress Notes (Signed)
Called patient and she stated that she wanted to schedule this closer to the 6 months because she does not know what her schedule will look like then.

## 2023-08-31 NOTE — Assessment & Plan Note (Signed)
Chronic, ongoing.  Currently followed by psychiatry. Will continue this collaboration and medications as ordered by them.  Denies any current SI/HI now that on medication.  Is aware if intrusive thoughts present to alert provider immediately or go to ER.

## 2023-08-31 NOTE — Progress Notes (Signed)
BP 125/84   Pulse 84   Ht 5\' 10"  (1.778 m)   Wt 220 lb (99.8 kg)   LMP 08/04/2023 (Approximate)   BMI 31.57 kg/m    Subjective:    Patient ID: Stacy West, female    DOB: [birth year], 42 y.o.   MRN: [id number]  HPI: Stacy West is a 42 y.o. female  Chief Complaint  Patient presents with   Depression   Virtual Visit via Video Note  I connected with Stacy West on 08/31/23 at  8:20 AM EDT by a video enabled telemedicine application and verified that I am speaking with the correct person using two identifiers.  Location: Patient: home Provider: work   I discussed the limitations of evaluation and management by telemedicine and the availability of in person appointments. The patient expressed understanding and agreed to proceed.  I discussed the assessment and treatment plan with the patient. The patient was provided an opportunity to ask questions and all were answered. The patient agreed with the plan and demonstrated an understanding of the instructions.   The patient was advised to call back or seek an in-person evaluation if the symptoms worsen or if the condition fails to improve as anticipated.  I provided 21 minutes of non-face-to-face time during this encounter.   Marjie Skiff, NP   DEPRESSION Continues on Prozac 10 MG and Duloxetine 30 MG.  Is seeing pain clinic, last visit 08/18/23.  Had MRI of cervical and thoracic recently.  Would like to apply for independent living.  Still getting harassed by neighbors.  Is seeing psychiatry at present and marital therapist ([facility]). Mood status: stable Satisfied with current treatment?: yes Symptom severity: moderate  Duration of current treatment : chronic Side effects: no Medication compliance: good compliance Psychotherapy/counseling: yes at present Depressed mood: yes Anxious mood: yes Anhedonia: no Significant weight loss or gain: no Insomnia: none Fatigue: yes Feelings of worthlessness or guilt:  yes Impaired concentration/indecisiveness: yes Suicidal ideations: had been fleeting, but none at present -- reports she has not plans -- this is before she started medication Hopelessness: yes Crying spells: yes    08/18/2023   10:14 AM 06/08/2023    8:49 AM 04/29/2023   10:33 AM 02/28/2023    9:41 AM 02/08/2023    3:28 PM  Depression screen PHQ 2/9  Decreased Interest 3 2 1   0  Down, Depressed, Hopeless 3 1 1  0 1  PHQ - 2 Score 6 3 2  0 1  Altered sleeping 3 3 0 1 1  Tired, decreased energy 3 2 3 1 1   Change in appetite 3 1 1 1 1   Feeling bad or failure about yourself  2 1 0 1   Trouble concentrating 3 3 3 1    Moving slowly or fidgety/restless 0 0 0 0   Suicidal thoughts 1 0 0 0   PHQ-9 Score 21 13 9 5 4   Difficult doing work/chores  Extremely dIfficult Very difficult Not difficult at all Not difficult at all       06/08/2023    8:49 AM 04/29/2023   10:34 AM 02/28/2023    9:41 AM 10/14/2022    2:02 PM  GAD 7 : Generalized Anxiety Score  Nervous, Anxious, on Edge 2 1 1 2   Control/stop worrying 2 0 1 2  Worry too much - different things 3 0 1 3  Trouble relaxing 2 3 1 3   Restless 0 3 1 0  Easily annoyed or irritable 1 1 1  2  Afraid - awful might happen 2 0 0 3  Total GAD 7 Score 12 8 6 15   Anxiety Difficulty Extremely difficult Very difficult Not difficult at all Somewhat difficult   Relevant past medical, surgical, family and social history reviewed and updated as indicated. Interim medical history since our last visit reviewed. Allergies and medications reviewed and updated.  Review of Systems  Constitutional:  Negative for activity change, appetite change, diaphoresis, fatigue and fever.  Respiratory:  Negative for cough, chest tightness and shortness of breath.   Cardiovascular:  Negative for chest pain, palpitations and leg swelling.  Gastrointestinal: Negative.   Endocrine: Negative for cold intolerance, heat intolerance, polydipsia, polyphagia and polyuria.   Musculoskeletal:  Positive for arthralgias.  Neurological:  Negative for dizziness, syncope, weakness, light-headedness, numbness and headaches.  Psychiatric/Behavioral:  Positive for decreased concentration. Negative for self-injury, sleep disturbance and suicidal ideas. The patient is nervous/anxious.    Per HPI unless specifically indicated above     Objective:    BP 125/84   Pulse 84   Ht 5\' 10"  (1.778 m)   Wt 220 lb (99.8 kg)   LMP 08/04/2023 (Approximate)   BMI 31.57 kg/m   Wt Readings from Last 3 Encounters:  08/31/23 220 lb (99.8 kg)  08/18/23 220 lb (99.8 kg)  06/08/23 222 lb 12.8 oz (101.1 kg)    Physical Exam Vitals and nursing note reviewed.  Constitutional:      General: She is awake. She is not in acute distress.    Appearance: She is well-developed. She is not ill-appearing.  HENT:     Head: Normocephalic.     Right Ear: Hearing normal.     Left Ear: Hearing normal.  Eyes:     General: Lids are normal.        Right eye: No discharge.        Left eye: No discharge.     Conjunctiva/sclera: Conjunctivae normal.  Pulmonary:     Effort: Pulmonary effort is normal. No accessory muscle usage or respiratory distress.  Musculoskeletal:     Cervical back: Normal range of motion.  Neurological:     Mental Status: She is alert and oriented to person, place, and time.  Psychiatric:        Attention and Perception: Attention normal.        Mood and Affect: Mood normal.        Behavior: Behavior normal. Behavior is cooperative.        Thought Content: Thought content normal.        Judgment: Judgment normal.    Results for orders placed or performed in visit on 06/08/23  Pregnancy, urine  Result Value Ref Range   Preg Test, Ur Negative Negative  Beta hCG quant (ref lab)  Result Value Ref Range   hCG Quant 3 mIU/mL      Assessment & Plan:   Problem List Items Addressed This Visit       Musculoskeletal and Integument   Ehlers-Danlos syndrome type III     Chronic, ongoing .  Continue collaboration with ortho, pain management, & cardiology.  At this time continue medication regimen as ordered by specialists.  She does use MJ, which offers her benefit -- used this in [place].  Discussed with her regulations in , which she is aware of.        Relevant Orders   Ambulatory referral to Social Work     Other   Moderate episode of recurrent major depressive disorder (HCC) -  Primary    Chronic, ongoing.  Currently followed by psychiatry. Will continue this collaboration and medications as ordered by them.  Denies any current SI/HI now that on medication.  Is aware if intrusive thoughts present to alert provider immediately or go to ER.      Relevant Medications   DULoxetine (CYMBALTA) 30 MG capsule   FLUoxetine (PROZAC) 10 MG capsule   Other Relevant Orders   Ambulatory referral to Social Work     Follow up plan: Return in about 6 months (around 02/29/2024) for [place] and Mood.

## 2023-09-09 ENCOUNTER — Telehealth: Payer: Self-pay | Admitting: *Deleted

## 2023-09-09 ENCOUNTER — Ambulatory Visit: Payer: Self-pay | Admitting: *Deleted

## 2023-09-09 NOTE — Patient Instructions (Signed)
Visit Information  Thank you for taking time to visit with me today. Please don't hesitate to contact me if I can be of assistance to you.   Following are the goals we discussed today:   Goals Addressed             This Visit's Progress    LCSW-care coordination activities       Care Coordination Interventions: Encouraged patient to contact the [facility] [phone] and Vocational Rehab [phone] for possible assistance with her home advocacy needs Encouraged patient to continue to seek legal counsel for advocacy and support         If you are experiencing a Mental Health or Behavioral Health Crisis or need someone to talk to, please call 911   Patient verbalizes understanding of instructions and care plan provided today and agrees to view in MyChart. Active MyChart status and patient understanding of how to access instructions and care plan via MyChart confirmed with patient.     No further follow up required: patient to contact this Child psychotherapist with any additional community resource needs    [employer], [employer] [place]  [facility], [facility] Licensed Clinical Social Geologist, engineering Dial: [phone]

## 2023-09-09 NOTE — Patient Outreach (Signed)
  Care Coordination   09/09/2023 Name: Kynsey Susko MRN: [id number] DOB: 1981-03-12   Care Coordination Outreach Attempts:  An unsuccessful telephone outreach was attempted for a scheduled appointment today.  Follow Up Plan:  Additional outreach attempts will be made to offer the patient care coordination information and services.   Encounter Outcome:  No Answer   Care Coordination Interventions:  No, not indicated    Adam Demary, LCSW [place]  [facility], [facility] Licensed Clinical Social Worker Care Coordinator  Direct Dial: [phone]

## 2023-09-09 NOTE — Patient Outreach (Signed)
  Care Coordination   Initial Visit Note   09/09/2023 Name: Mignonette Schlotfeldt MRN: [id number] DOB: [birth year]  Teneeshia Overberg is a 42 y.o. year old female who sees Aura Dials T, NP for primary care. I spoke with  Clarene Reamer by phone today.  What matters to the patients health and wellness today?  Patient requesting assistance with designated her home as Independent Living    Goals Addressed             This Visit's Progress    LCSW-care coordination activities       Care Coordination Interventions: Encouraged patient to contact the [facility]abilities [facility] [phone] and [facility]ocational [facility] [phone] for possible assistance with her home advocacy needs Encouraged patient to continue to seek legal counsel for advocacy and support          SDOH assessments and interventions completed:  Yes  SDOH Interventions Today    Flowsheet Row Most Recent Value  SDOH Interventions   Food Insecurity Interventions Other (Comment)  [food stamp application in process-declines food bank options]  Housing Interventions Intervention Not Indicated  Utilities Interventions Intervention Not Indicated  Depression Interventions/Treatment  Currently on Treatment        Care Coordination Interventions:  Yes, provided  Interventions Today    Flowsheet Row Most Recent Value  Chronic Disease   Chronic disease during today's visit Other  [ehlers-danlos syndrome , depression]  General Interventions   General Interventions Discussed/Reviewed General Interventions Discussed, [employer], Communication with  [pt requesting documentation to provide to her HOA establishing her home as a independent living-she plans to open a sp.needs home day care -HOA will not approve. CSW confirmed inability to provide documentation and recommended that she seek legal counsel]  Communication with --  [vocational rehab-independent living confirmed that independent living will only be able to assist with  home modifications ie w/c ramps, bathroom accommodations -confirmed that they do not provide advocacy to designate home as a independent llving]  Education Interventions   Education Provided Provided Education  Provided Verbal Education On [employer]  [encouraged patient to contact the disabilities advocacy center for additional support and resources as well as legal counsel]  Mental Health Interventions   Mental Health Discussed/Reviewed Mental Health Discussed  [confirmed that patient is active with [facility]]       Follow up plan: No further intervention required.   Encounter Outcome:  Patient Visit Completed

## 2023-09-22 DIAGNOSIS — M9902 Segmental and somatic dysfunction of thoracic region: Secondary | ICD-10-CM | POA: Diagnosis not present

## 2023-09-22 DIAGNOSIS — R519 Headache, unspecified: Secondary | ICD-10-CM | POA: Diagnosis not present

## 2023-09-22 DIAGNOSIS — M5412 Radiculopathy, cervical region: Secondary | ICD-10-CM | POA: Diagnosis not present

## 2023-09-22 DIAGNOSIS — M9901 Segmental and somatic dysfunction of cervical region: Secondary | ICD-10-CM | POA: Diagnosis not present

## 2023-09-28 ENCOUNTER — Encounter: Payer: Self-pay | Admitting: Nurse Practitioner

## 2023-09-28 ENCOUNTER — Other Ambulatory Visit: Payer: Self-pay | Admitting: Nurse Practitioner

## 2023-09-28 DIAGNOSIS — R519 Headache, unspecified: Secondary | ICD-10-CM | POA: Diagnosis not present

## 2023-09-28 DIAGNOSIS — M5412 Radiculopathy, cervical region: Secondary | ICD-10-CM | POA: Diagnosis not present

## 2023-09-28 DIAGNOSIS — M9901 Segmental and somatic dysfunction of cervical region: Secondary | ICD-10-CM | POA: Diagnosis not present

## 2023-09-28 DIAGNOSIS — M9902 Segmental and somatic dysfunction of thoracic region: Secondary | ICD-10-CM | POA: Diagnosis not present

## 2023-09-28 DIAGNOSIS — D8940 Mast cell activation, unspecified: Secondary | ICD-10-CM

## 2023-09-29 NOTE — Telephone Encounter (Signed)
Requested Prescriptions  Pending Prescriptions Disp Refills   cetirizine (ZYRTEC) 10 MG tablet [Pharmacy Med Name: CETIRIZINE HCL 10 MG TABLET] 90 tablet 1    Sig: TAKE 1 TABLET BY MOUTH EVERY DAY     Ear, Nose, and Throat:  Antihistamines 2 Passed - 09/28/2023  1:34 AM      Passed - Cr in normal range and within 360 days    Creatinine, Ser  Date Value Ref Range Status  02/28/2023 0.77 0.57 - 1.00 mg/dL Final         Passed - Valid encounter within last 12 months    Recent Outpatient Visits           4 weeks ago Moderate episode of recurrent major depressive disorder (HCC)   [place] [facility] [place], [place] T, NP   3 months ago Possible pregnancy   [facility] [place], [place] T, NP   5 months ago 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HCC)   [place] [facility] [place], [place] T, NP   7 months ago Moderate episode of recurrent major depressive disorder (HCC)   [place] [facility] [place], Corrie Dandy T, NP   11 months ago Mast cell activation [facility])   [place] [facility] [place], Oswaldo Conroy, PA-C

## 2023-10-06 ENCOUNTER — Ambulatory Visit
Payer: 59 | Attending: Student in an Organized Health Care Education/Training Program | Admitting: Student in an Organized Health Care Education/Training Program

## 2023-10-06 ENCOUNTER — Encounter: Payer: Self-pay | Admitting: Student in an Organized Health Care Education/Training Program

## 2023-10-06 VITALS — BP 109/77 | HR 72 | Temp 97.2°F | Ht 71.0 in | Wt 230.0 lb

## 2023-10-06 DIAGNOSIS — G894 Chronic pain syndrome: Secondary | ICD-10-CM | POA: Diagnosis not present

## 2023-10-06 DIAGNOSIS — Q7962 Hypermobile Ehlers-Danlos syndrome: Secondary | ICD-10-CM | POA: Insufficient documentation

## 2023-10-06 DIAGNOSIS — M5481 Occipital neuralgia: Secondary | ICD-10-CM | POA: Insufficient documentation

## 2023-10-06 DIAGNOSIS — M542 Cervicalgia: Secondary | ICD-10-CM | POA: Insufficient documentation

## 2023-10-06 MED ORDER — METHOCARBAMOL 1000 MG/10ML IJ SOLN
200.0000 mg | Freq: Once | INTRAMUSCULAR | Status: AC
  Administered 2023-10-06: 200 mg via INTRAMUSCULAR
  Filled 2023-10-06: qty 10

## 2023-10-06 MED ORDER — KETOROLAC TROMETHAMINE 30 MG/ML IJ SOLN
30.0000 mg | Freq: Once | INTRAMUSCULAR | Status: AC
  Administered 2023-10-06: 30 mg via INTRAMUSCULAR
  Filled 2023-10-06: qty 1

## 2023-10-06 NOTE — Progress Notes (Signed)
Safety precautions to be maintained throughout the outpatient stay will include: orient to surroundings, keep bed in low position, maintain call bell within reach at all times, provide assistance with transfer out of bed and ambulation.  

## 2023-10-06 NOTE — Progress Notes (Signed)
PROVIDER NOTE: Information contained herein reflects review and annotations entered in association with encounter. Interpretation of such information and data should be left to medically-trained personnel. Information provided to patient can be located elsewhere in the medical record under "Patient Instructions". Document created using STT-dictation technology, any transcriptional errors that may result from process are unintentional.    Patient: Stacy West  Service Category: E/M  Provider: Edward Jolly, MD  DOB: 19-Jul-1981  DOS: 10/06/2023  Referring Provider: Marjie Skiff, NP  MRN: [id number]  Specialty: Interventional Pain Management  PCP: Marjie Skiff, NP  Type: Established Patient  Setting: Ambulatory outpatient    Location: Office  Delivery: Face-to-face     HPI  [name], a 42 y.o. year old female, is here today because of her Ehlers-Danlos syndrome type III [Q79.62]. Ms. Jeffords primary complain today is Back Pain (In between shoulder pain, head and neck and lower)   Pain Assessment: Severity of Chronic pain is reported as a 4 /10. Location: Back (neck and shoulders) Mid, Lower/denies. Onset: More than a month ago. Quality: Stabbing. Timing: Constant. Modifying factor(s): denies. Vitals:  height is 5\' 11"  (1.803 m) and weight is 230 lb (104.3 kg). Her temperature is 97.2 F (36.2 C) (abnormal). Her blood pressure is 109/77 and her pulse is 72. Her oxygen saturation is 98%.  BMI: Estimated body mass index is 32.08 kg/m as calculated from the following:   Height as of this encounter: 5\' 11"  (1.803 m).   Weight as of this encounter: 230 lb (104.3 kg). Last encounter: 08/18/2023. Last procedure: Visit date not found.  Reason for encounter: follow-up evaluation.  Discussed the use of AI scribe software for clinical note transcription with the patient, who gave verbal consent to proceed.  History of Present Illness   The patient, with a history of Ehlers-Danlos  syndrome and suspected autoimmune conditions, presents with worsening pain. They report a recent illness characterized by coughing and sneezing, which exacerbated their pain and led to instances of neck and shoulder subluxations. This resulted in severe discomfort, necessitating multiple chiropractic adjustments and the use of ice for relief. At its worst, the patient experienced difficulty lifting their head and breathing due to pain around the back rib area. This episode lasted approximately three weeks, with residual symptoms still present.  The patient describes their current pain as radiating through the shoulders and back, with limited neck mobility causing additional discomfort. They also report difficulty eating due to the severity of the pain. Despite these symptoms, recent MRI scans of the cervical and thoracic spine appear stable and unremarkable. The patient is concerned about potential inflammation and the role of their autoimmune conditions in their current symptoms. They are scheduled to see a specialist for further evaluation of these conditions in the coming month.  The patient has been managing their pain with chiropractic adjustments and is considering the use of a self-massager for their neck and shoulders. However, they express concern about potential negative effects on their surgical history and the possibility of causing more subluxations. They are also considering the use of a TENS unit for muscle stimulation. The patient has found relief from Toradol and Dilaudid in the past, but understands the latter is typically reserved for post-surgical pain management.      HPI from initial clinic visit: Stacy West is a 42 year old female with a fairly complicated extensive past medical history including occiput to C2 fusion in addition to a C6-C7 ACDF.  Chief complaints of cervical spine pain, occipital  pain, mid thoracic pain.  Lumbar spine pain.  Her history is notable for fibromyalgia,  Ehlers-Danlos disease, and SI joint pain.  She has many doctors and healthcare providers that she sees.  She engages in physical therapy.  She states that she is unable to tolerate steroids.  She has been seen by physical medicine and rehab.  She is being referred here to consider spinal cord stimulation. She states that she does not have a clear understanding of what that entails.   Constitutional: Denies any fever or chills Gastrointestinal: No reported hemesis, hematochezia, vomiting, or acute GI distress Musculoskeletal:  As above Neurological: No reported episodes of acute onset apraxia, aphasia, dysarthria, agnosia, amnesia, paralysis, loss of coordination, or loss of consciousness  Medication Review  Cholecalciferol, DULoxetine, FLUoxetine, Naltrexone HCl, Omega-3, SYRINGE-NEEDLE (DISP) 3 ML, cetirizine, cyanocobalamin, cyclobenzaprine, famotidine, hydrOXYzine, levocetirizine, metFORMIN, montelukast, pantoprazole, pregabalin, and trazodone  History Review  Allergy: Ms. Dransfield is allergic to carisoprodol-aspirin-codeine, carisoprodol, percocet  [oxycodone-acetaminophen], gluten meal, and tramadol. Drug: Ms. Hammerstrom  reports that she does not currently use drugs. Alcohol:  reports that she does not currently use alcohol. Tobacco:  reports that she quit smoking about 6 years ago. Her smoking use included cigarettes. She has never used smokeless tobacco. Social: Ms. Michalski  reports that she quit smoking about 6 years ago. Her smoking use included cigarettes. She has never used smokeless tobacco. She reports that she does not currently use alcohol. She reports that she does not currently use drugs. Medical:  has a past medical history of Abnormal Pap smear of cervix, Allergy, Celiac disease, Depression, Ehlers-Danlos disease, Fibromyalgia, Heart murmur, Insulin resistance, Miscarriage, Other cervical disc degeneration at C4-C5 level, PCOS (polycystic ovarian syndrome), PCOS (polycystic ovarian  syndrome), Premature ovarian failure, Recurrent upper respiratory infection (URI), SI (sacroiliac) joint dysfunction, Sleep apnea, and Thyroid disease. Surgical: Ms. Bearup  has a past surgical history that includes Back surgery; Colposcopy; Finger surgery (Left); Neck surgery (07/2020); Tonsillectomy; Wisdom tooth extraction; and Adenoidectomy. Family: family history includes Alcoholism in her brother; Asthma in her maternal grandmother and mother; Bipolar disorder in her brother; Breast cancer in her maternal aunt; COPD in her father; Depression in her brother and brother; Early menopause in her maternal aunt; Heart murmur in her maternal aunt and mother; Skin cancer in her father.  Laboratory Chemistry Profile   Renal Lab Results  Component Value Date   BUN 11 02/28/2023   CREATININE 0.77 02/28/2023   BCR 14 02/28/2023   GFRAA >60 05/25/2019   GFRNONAA >60 05/25/2019    Hepatic Lab Results  Component Value Date   AST 11 02/28/2023   ALT 14 02/28/2023   ALBUMIN 4.5 02/28/2023   ALKPHOS 63 02/28/2023    Electrolytes Lab Results  Component Value Date   NA 138 02/28/2023   K 5.0 02/28/2023   CL 103 02/28/2023   CALCIUM 9.5 02/28/2023   MG 2.0 02/28/2023    Bone Lab Results  Component Value Date   VD25OH 31.3 02/28/2023   TESTOFREE 1.5 04/28/2021   TESTOSTERONE 28 04/28/2021    Inflammation (CRP: Acute Phase) (ESR: Chronic Phase) Lab Results  Component Value Date   CRP 3 02/28/2023   ESRSEDRATE 6 02/28/2023         Note: Above Lab results reviewed.  Recent Imaging Review  MR THORACIC SPINE WO CONTRAST CLINICAL DATA:  History of Ehlers-Danlos with chronic numbness and tingling in upper extremities. History of cervical spine fusion.  EXAM: MRI CERVICAL AND THORACIC SPINE WITHOUT  CONTRAST  TECHNIQUE: Multiplanar and multiecho pulse sequences of the cervical spine, to include the craniocervical junction and cervicothoracic junction, and the thoracic spine, were  obtained without intravenous contrast.  COMPARISON:  Cervical and thoracic spine radiographs 05/31/2023, cervical spine CT 05/06/2020  FINDINGS: MRI CERVICAL SPINE FINDINGS  Alignment: There is a reversal of the normal cervical curvature centered at C4-C5. There is no antero or retrolisthesis.  Vertebrae: Vertebral body heights are preserved. Background marrow signal is normal. There is no suspicious marrow signal abnormality or marrow edema. Postsurgical changes reflecting posterior fusion at the occiput through C2 and anterior fusion at C6-C7 are noted.  Cord: Normal in signal and morphology.  Posterior Fossa, vertebral arteries, paraspinal tissues: The imaged posterior fossa is unremarkable. The vertebral artery flow voids are normal. The paraspinal soft tissues are unremarkable.  Disc levels:  C2-C3: No significant spinal canal or neural foraminal stenosis.  C3-C4: No significant spinal canal or neural foraminal stenosis.  C4-C5: There is mild disc desiccation and narrowing with mild uncovertebral arthropathy resulting in mild right and no significant left neural foraminal stenosis and no significant spinal canal stenosis.  C5-C6: Minimal uncovertebral arthropathy without significant spinal canal or neural foraminal stenosis.  C6-C7: Status post ACDF without significant spinal canal or neural foraminal stenosis  C7-T1: No significant spinal canal or neural foraminal stenosis.  MRI THORACIC SPINE FINDINGS  Alignment:  Normal.  Vertebrae: Vertebral body heights are preserved. Background marrow signal is normal. There is no suspicious marrow signal abnormality or marrow edema.  Cord:  Normal in signal and morphology.  Paraspinal and other soft tissues: Unremarkable.  Disc levels:  There is mild disc degeneration at T11-T12. The other disc spaces are preserved. There is no significant disc herniation. There is no significant facet arthropathy. There is no  significant spinal canal or neural foraminal stenosis at any level.  IMPRESSION: 1. Status post posterior fusion from the occiput through C2 and anterior fusion at C6-C7. 2. Mild degenerative changes at C4-C5 and C5-C6 without significant spinal canal or neural foraminal stenosis. 3. Mild disc degeneration at T11-T12. Otherwise unremarkable thoracic spine MRI.  Electronically Signed   By: Lesia Hausen M.D.   On: 09/08/2023 16:33 MR CERVICAL SPINE WO CONTRAST CLINICAL DATA:  History of Ehlers-Danlos with chronic numbness and tingling in upper extremities. History of cervical spine fusion.  EXAM: MRI CERVICAL AND THORACIC SPINE WITHOUT CONTRAST  TECHNIQUE: Multiplanar and multiecho pulse sequences of the cervical spine, to include the craniocervical junction and cervicothoracic junction, and the thoracic spine, were obtained without intravenous contrast.  COMPARISON:  Cervical and thoracic spine radiographs 05/31/2023, cervical spine CT 05/06/2020  FINDINGS: MRI CERVICAL SPINE FINDINGS  Alignment: There is a reversal of the normal cervical curvature centered at C4-C5. There is no antero or retrolisthesis.  Vertebrae: Vertebral body heights are preserved. Background marrow signal is normal. There is no suspicious marrow signal abnormality or marrow edema. Postsurgical changes reflecting posterior fusion at the occiput through C2 and anterior fusion at C6-C7 are noted.  Cord: Normal in signal and morphology.  Posterior Fossa, vertebral arteries, paraspinal tissues: The imaged posterior fossa is unremarkable. The vertebral artery flow voids are normal. The paraspinal soft tissues are unremarkable.  Disc levels:  C2-C3: No significant spinal canal or neural foraminal stenosis.  C3-C4: No significant spinal canal or neural foraminal stenosis.  C4-C5: There is mild disc desiccation and narrowing with mild uncovertebral arthropathy resulting in mild right and no  significant left neural foraminal stenosis and no  significant spinal canal stenosis.  C5-C6: Minimal uncovertebral arthropathy without significant spinal canal or neural foraminal stenosis.  C6-C7: Status post ACDF without significant spinal canal or neural foraminal stenosis  C7-T1: No significant spinal canal or neural foraminal stenosis.  MRI THORACIC SPINE FINDINGS  Alignment:  Normal.  Vertebrae: Vertebral body heights are preserved. Background marrow signal is normal. There is no suspicious marrow signal abnormality or marrow edema.  Cord:  Normal in signal and morphology.  Paraspinal and other soft tissues: Unremarkable.  Disc levels:  There is mild disc degeneration at T11-T12. The other disc spaces are preserved. There is no significant disc herniation. There is no significant facet arthropathy. There is no significant spinal canal or neural foraminal stenosis at any level.  IMPRESSION: 1. Status post posterior fusion from the occiput through C2 and anterior fusion at C6-C7. 2. Mild degenerative changes at C4-C5 and C5-C6 without significant spinal canal or neural foraminal stenosis. 3. Mild disc degeneration at T11-T12. Otherwise unremarkable thoracic spine MRI.  Electronically Signed   By: Lesia Hausen M.D.   On: 09/08/2023 16:33 Note: Reviewed        Physical Exam  General appearance: Well nourished, well developed, and well hydrated. In no apparent acute distress Mental status: Alert, oriented x 3 (person, place, & time)       Respiratory: No evidence of acute respiratory distress Eyes: PERLA Vitals: BP 109/77   Pulse 72   Temp (!) 97.2 F (36.2 C)   Ht 5\' 11"  (1.803 m)   Wt 230 lb (104.3 kg)   LMP  (LMP Unknown) Comment: no period for 4-5 months  SpO2 98%   BMI 32.08 kg/m  BMI: Estimated body mass index is 32.08 kg/m as calculated from the following:   Height as of this encounter: 5\' 11"  (1.803 m).   Weight as of this encounter: 230 lb  (104.3 kg). Ideal: Ideal body weight: 70.8 kg (156 lb 1.4 oz) Adjusted ideal body weight: 84.2 kg (185 lb 10.4 oz)  Assessment   Diagnosis  1. Ehlers-Danlos syndrome type III   2. Bilateral occipital neuralgia   3. Cervicalgia   4. Chronic pain syndrome      Updated Problems: No problems updated.  Plan of Care  Problem-specific:  Assessment and Plan    Chronic Neck and Back Pain They are experiencing worsening neck and shoulder blade pain, exacerbated by a recent illness with coughing and sneezing, which radiates through the back and neck, causing difficulty in lifting the head and breathing. Cervical and thoracic MRIs show well-managed hardware and no significant stenosis, suggesting the pain is likely musculoskeletal, influenced by Ehlers-Danlos syndrome. We will administer Toradol and Robaxin injections, having discussed the risks, benefits, and potential side effects, including the need to avoid anti-inflammatory medications for five days post-injection. We demonstrated the TENS unit for potential use and advised against using self-massaging machines without professional guidance. We recommend consulting a massage therapist experienced with Ehlers-Danlos patients.  Autoimmune Conditions (Lupus, Mast Cell Activation) They report ongoing inflammation and pain, with concerns about lupus and mast cell activation contributing to symptoms. An upcoming specialist appointment is scheduled for April. We emphasized the importance of finding a rheumatologist experienced with Ehlers-Danlos and other autoimmune conditions. We discussed monitoring biomarkers such as C-reactive protein and ESR for signs of inflammation.   General Health Maintenance They inquired about the safety of using massagers for neck and shoulder pain. We advised consulting a massage therapist experienced with Ehlers-Danlos patients and recommended against using  self-massaging machines without professional guidance.        Pharmacotherapy (Medications Ordered): Meds ordered this encounter  Medications   ketorolac (TORADOL) 30 MG/ML injection 30 mg   methocarbamol (ROBAXIN) injection 200 mg   Orders:  No orders of the defined types were placed in this encounter.  Follow-up plan:   Return for patient will call to schedule F2F appt prn.     Recent Visits Date Type Provider Dept  08/18/23 Office Visit Edward Jolly, MD [facility]  Showing recent visits within past 90 days and meeting all other requirements Today's Visits Date Type Provider Dept  10/06/23 Office Visit Edward Jolly, MD [facility]  Showing today's visits and meeting all other requirements Future Appointments No visits were found meeting these conditions. Showing future appointments within next 90 days and meeting all other requirements  I discussed the assessment and treatment plan with the patient. The patient was provided an opportunity to ask questions and all were answered. The patient agreed with the plan and demonstrated an understanding of the instructions.  Patient advised to call back or seek an in-person evaluation if the symptoms or condition worsens.  Duration of encounter: .  Total time on encounter, as per AMA guidelines included both the face-to-face and non-face-to-face time personally spent by the physician and/or other qualified health care professional(s) on the day of the encounter (includes time in activities that require the physician or other qualified health care professional and does not include time in activities normally performed by clinical staff). Physician's time may include the following activities when performed: Preparing to see the patient (e.g., pre-charting review of records, searching for previously ordered imaging, lab work, and nerve conduction tests) Review of prior analgesic pharmacotherapies. Reviewing PMP Interpreting ordered tests (e.g., lab work, imaging,  nerve conduction tests) Performing post-procedure evaluations, including interpretation of diagnostic procedures Obtaining and/or reviewing separately obtained history Performing a medically appropriate examination and/or evaluation Counseling and educating the patient/family/caregiver Ordering medications, tests, or procedures Referring and communicating with other health care professionals (when not separately reported) Documenting clinical information in the electronic or other health record Independently interpreting results (not separately reported) and communicating results to the patient/ family/caregiver Care coordination (not separately reported)  Note by: Edward Jolly, MD Date: 10/06/2023; Time: 11:31 AM

## 2023-10-21 ENCOUNTER — Other Ambulatory Visit: Payer: Self-pay | Admitting: Nurse Practitioner

## 2023-10-21 NOTE — Telephone Encounter (Signed)
Requested Prescriptions  Pending Prescriptions Disp Refills   levocetirizine (XYZAL) 5 MG tablet [Pharmacy Med Name: LEVOCETIRIZINE 5 MG TABLET] 90 tablet 1    Sig: TAKE 1 TABLET BY MOUTH EVERY DAY IN THE EVENING     Ear, Nose, and Throat:  Antihistamines - levocetirizine dihydrochloride Passed - 10/21/2023  1:44 AM      Passed - Cr in normal range and within 360 days    Creatinine, Ser  Date Value Ref Range Status  02/28/2023 0.77 0.57 - 1.00 mg/dL Final         Passed - eGFR is 10 or above and within 360 days    GFR calc Af Amer  Date Value Ref Range Status  05/25/2019 >60 >60 mL/min Final   GFR calc non Af Amer  Date Value Ref Range Status  05/25/2019 >60 >60 mL/min Final   eGFR  Date Value Ref Range Status  02/28/2023 100 >59 mL/min/1.73 Final         Passed - Valid encounter within last 12 months    Recent Outpatient Visits           1 month ago Moderate episode of recurrent major depressive disorder (HCC)   [place] [facility] [place], Corrie Dandy T, NP   4 months ago Possible pregnancy   [place] [facility] [place], [place] T, NP   5 months ago 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HCC)   [place] [facility] [place], [place] T, NP   7 months ago Moderate episode of recurrent major depressive disorder (HCC)   [place] [facility] [place], Corrie Dandy T, NP   1 year ago Mast cell activation [facility])   [place] [facility] M[place]um, Oswaldo Conroy, PA-C

## 2023-11-23 ENCOUNTER — Telehealth: Payer: Self-pay | Admitting: Nurse Practitioner

## 2023-11-23 NOTE — Telephone Encounter (Signed)
 Copied from CRM [id number]. Topic: General - Other >> Nov 22, 2023  5:13 PM Ja-Kwan M wrote: Reason for CRM: Pt stated that her daughter Fable Huisman has an appt with Dr. Vicci on 11/29/23 and she would like to request to be seen at the same time because they were in contact with someone who was diagnosed with RSV.

## 2023-11-23 NOTE — Telephone Encounter (Signed)
 Called and advised patient that her provider did not have anything at the same time [facility] Urgent care was suggested.

## 2023-11-24 ENCOUNTER — Telehealth: Payer: 59 | Admitting: Family Medicine

## 2023-11-24 DIAGNOSIS — R051 Acute cough: Secondary | ICD-10-CM | POA: Diagnosis not present

## 2023-11-24 DIAGNOSIS — B9689 Other specified bacterial agents as the cause of diseases classified elsewhere: Secondary | ICD-10-CM

## 2023-11-24 DIAGNOSIS — J019 Acute sinusitis, unspecified: Secondary | ICD-10-CM

## 2023-11-24 MED ORDER — AMOXICILLIN-POT CLAVULANATE 875-125 MG PO TABS: 1.0000 | ORAL_TABLET | Freq: Two times a day (BID) | ORAL | 0 refills | Status: AC

## 2023-11-24 MED ORDER — ALBUTEROL SULFATE HFA 108 (90 BASE) MCG/ACT IN AERS: 1.0000 | INHALATION_SPRAY | RESPIRATORY_TRACT | 0 refills | Status: DC | PRN

## 2023-11-24 NOTE — Patient Instructions (Signed)
 Marsella Asato, thank you for joining Chiquita CHRISTELLA Barefoot, NP for today's virtual visit.  While this provider is not your primary care provider (PCP), if your PCP is located in our provider database this encounter information will be shared with them immediately following your visit.   A [facility] MyChart account gives you access to today's visit and all your visits, tests, and labs performed at [facility]  click here if you don't have a [facility] MyChart account or go to [email or web address]  Consent: (Patient) Stacy West provided verbal consent for this virtual visit at the beginning of the encounter.  Current Medications:  Current Outpatient Medications:    albuterol  (VENTOLIN  HFA) 108 (90 Base) MCG/ACT inhaler, Inhale 1-2 puffs into the lungs every 4 (four) hours as needed for wheezing or shortness of breath (cough)., Disp: 8 g, Rfl: 0   amoxicillin -clavulanate (AUGMENTIN ) 875-125 MG tablet, Take 1 tablet by mouth 2 (two) times daily for 7 days., Disp: 14 tablet, Rfl: 0   cetirizine  (ZYRTEC ) 10 MG tablet, TAKE 1 TABLET BY MOUTH EVERY DAY, Disp: 90 tablet, Rfl: 1   Cholecalciferol  1.25 MG (50000 UT) TABS, Take 1 tablet by mouth once a week., Disp: 12 tablet, Rfl: 4   cyanocobalamin  (VITAMIN B12) 1000 MCG/ML injection, Inject 1 mL (1,000 mcg total) into the muscle every 30 (thirty) days., Disp: 30 mL, Rfl: 2   cyclobenzaprine  (FLEXERIL ) 10 MG tablet, Take 1 tablet (10 mg total) by mouth 3 (three) times daily as needed for muscle spasms., Disp: 270 tablet, Rfl: 4   DULoxetine  (CYMBALTA ) 30 MG capsule, Take 30 mg by mouth daily., Disp: , Rfl:    famotidine  (PEPCID ) 20 MG tablet, TAKE 1 TABLET BY MOUTH TWICE A DAY, Disp: 180 tablet, Rfl: 0   FLUoxetine (PROZAC) 10 MG capsule, Take 10 mg by mouth daily., Disp: , Rfl:    hydrOXYzine  (ATARAX ) 10 MG tablet, Take 5-10 mg by mouth daily as needed., Disp: , Rfl:    levocetirizine (XYZAL ) 5 MG tablet, TAKE 1 TABLET BY MOUTH EVERY DAY  IN THE EVENING, Disp: 90 tablet, Rfl: 1   metFORMIN  (GLUCOPHAGE -XR) 500 MG 24 hr tablet, Take 2 tablets by mouth 2 (two) times daily., Disp: , Rfl:    montelukast  (SINGULAIR ) 10 MG tablet, TAKE 1 TABLET BY MOUTH EVERYDAY AT BEDTIME, Disp: 90 tablet, Rfl: 0   NALTREXONE HCL PO, Take 1.5 mg by mouth daily., Disp: , Rfl:    Omega-3 1000 MG CAPS, Take 1 capsule by mouth every morning., Disp: , Rfl:    pantoprazole  (PROTONIX ) 40 MG tablet, TAKE 1 TABLET BY MOUTH EVERY DAY, Disp: 90 tablet, Rfl: 3   pregabalin  (LYRICA ) 50 MG capsule, Take 1 capsule (50 mg total) by mouth at bedtime., Disp: 90 capsule, Rfl: 2   SYRINGE-NEEDLE, DISP, 3 ML (B-D 3CC LUER-LOK SYR 20GX1) 20G X 1 3 ML MISC, Use to inject B12 monthly intramuscular., Disp: 50 each, Rfl: 4   trazodone  (DESYREL ) 300 MG tablet, Take 50 mg by mouth at bedtime., Disp: , Rfl:    Medications ordered in this encounter:  Meds ordered this encounter  Medications   amoxicillin -clavulanate (AUGMENTIN ) 875-125 MG tablet    Sig: Take 1 tablet by mouth 2 (two) times daily for 7 days.    Dispense:  14 tablet    Refill:  0    Supervising Provider:   BLAISE ALEENE KIDD [[id number]]   albuterol  (VENTOLIN  HFA) 108 (90 Base) MCG/ACT inhaler    Sig: Inhale 1-2 puffs  into the lungs every 4 (four) hours as needed for wheezing or shortness of breath (cough).    Dispense:  8 g    Refill:  0    Supervising Provider:   BLAISE ALEENE KIDD [[id number]]     *If you need refills on other medications prior to your next appointment, please contact your pharmacy*  Follow-Up: Call back or seek an in-person evaluation if the symptoms worsen or if the condition fails to improve as anticipated.  [facility] [phone]  Other Instructions   - Increased rest - Increasing Fluids - Acetaminophen  / ibuprofen as needed for fever/pain.  - Salt water gargling, chloraseptic spray and throat lozenges - Mucinex if mucus is present and increasing.  - Saline nasal  spray if congestion or if nasal passages feel dry. - Humidifying the air.     If you have been instructed to have an in-person evaluation today at a local Urgent Care facility, please use the link below. It will take you to a list of all of our available [facility], including address, phone number and hours of operation. Please do not delay care.  [facility]  If you or a family member do not have a primary care provider, use the link below to schedule a visit and establish care. When you choose a [facility] primary care physician or advanced practice provider, you gain a long-term partner in health. Find a Primary Care Provider  Learn more about [facility]'s in-office and virtual care options: [facility] - Get Care Now

## 2023-11-24 NOTE — Progress Notes (Signed)
 Virtual Visit Consent   Stevey Villamar, you are scheduled for a virtual visit with a [facility] provider today. Just as with appointments in the office, your consent must be obtained to participate. Your consent will be active for this visit and any virtual visit you may have with one of our providers in the next 365 days. If you have a MyChart account, a copy of this consent can be sent to you electronically.  As this is a virtual visit, video technology does not allow for your provider to perform a traditional examination. This may limit your provider's ability to fully assess your condition. If your provider identifies any concerns that need to be evaluated in person or the need to arrange testing (such as labs, EKG, etc.), we will make arrangements to do so. Although advances in technology are sophisticated, we cannot ensure that it will always work on either your end or our end. If the connection with a video visit is poor, the visit may have to be switched to a telephone visit. With either a video or telephone visit, we are not always able to ensure that we have a secure connection.  By engaging in this virtual visit, you consent to the provision of healthcare and authorize for your insurance to be billed (if applicable) for the services provided during this visit. Depending on your insurance coverage, you may receive a charge related to this service.  I need to obtain your verbal consent now. Are you willing to proceed with your visit today? Stacy West has provided verbal consent on 11/24/2023 for a virtual visit (video or telephone). Chiquita CHRISTELLA Barefoot, NP  Date: 11/24/2023 10:47 AM  Virtual Visit via Video Note   I, Chiquita CHRISTELLA Barefoot, connected with  Stacy West  ([id number], Jan 23, 1981) on 11/24/23 at 10:45 AM EST by a video-enabled telemedicine application and verified that I am speaking with the correct person using two identifiers.  Location: Patient: Virtual Visit Location Patient:  Home Provider: Virtual Visit Location Provider: Home Office   I discussed the limitations of evaluation and management by telemedicine and the availability of in person appointments. The patient expressed understanding and agreed to proceed.    History of Present Illness: Stacy West is a 43 y.o. who identifies as a female who was assigned female at birth, and is being seen today for cough and congestion.  Onset was one month ago- and runny nose and fatigue, congestion Associated symptoms are cough with congestion producing chunks of mucus from chest, fever on and off- highest is 99, chills, head and neck, sneezing, continued fatigue and eye pain  Modifying factors are using daily meds as directed, mucinex, flonase, sudafed and steam and rest  Denies chest pain, shortness of breath  Exposure to sick contacts- known- PNA, RVS in the home   Problems:  Patient Active Problem List   Diagnosis Date Noted   S/P cervical spinal fusion 08/18/2023   Cervical fusion syndrome 08/18/2023   Bilateral occipital neuralgia 08/18/2023   Chronic thoracic spine pain 08/18/2023   Chronic radicular cervical pain 08/18/2023   Thoracogenic scoliosis of thoracic region 06/08/2023   B12 deficiency 04/29/2023   Pain in joint, ankle and foot 12/21/2022   Pain in left lower leg 06/22/2022   Other allergic rhinitis 04/26/2022   Chronic cough 04/26/2022   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HCC) 02/23/2022   Chronic neck pain 12/20/2021   Chronic pain syndrome 12/20/2021   Fibromyalgia 12/20/2021   Sinus tachycardia 08/17/2021   Gastroesophageal reflux  disease 05/07/2021   Moderate episode of recurrent major depressive disorder (HCC) 01/15/2021   Celiac disease 01/31/2020   Right ovarian cyst 08/15/2019   Cervical spondylosis without myelopathy 07/04/2019   MVP (mitral valve prolapse) 05/31/2019   Ehlers-Danlos syndrome type III 05/17/2019   PCOS (polycystic ovarian syndrome) 05/17/2019   Insulin  resistance  10/21/2016   Chronic fatigue syndrome with fibromyalgia 01/31/2007   Degenerative disc disease at L5-S1 level 01/31/2007    Allergies:  Allergies  Allergen Reactions   Carisoprodol-Aspirin-Codeine Hives and Itching   Carisoprodol Dermatitis   Percocet  [Oxycodone-Acetaminophen ] Dermatitis   Gluten Meal Hives and Nausea Only   Tramadol    Medications:  Current Outpatient Medications:    cetirizine  (ZYRTEC ) 10 MG tablet, TAKE 1 TABLET BY MOUTH EVERY DAY, Disp: 90 tablet, Rfl: 1   Cholecalciferol  1.25 MG (50000 UT) TABS, Take 1 tablet by mouth once a week., Disp: 12 tablet, Rfl: 4   cyanocobalamin  (VITAMIN B12) 1000 MCG/ML injection, Inject 1 mL (1,000 mcg total) into the muscle every 30 (thirty) days., Disp: 30 mL, Rfl: 2   cyclobenzaprine  (FLEXERIL ) 10 MG tablet, Take 1 tablet (10 mg total) by mouth 3 (three) times daily as needed for muscle spasms., Disp: 270 tablet, Rfl: 4   DULoxetine  (CYMBALTA ) 30 MG capsule, Take 30 mg by mouth daily., Disp: , Rfl:    famotidine  (PEPCID ) 20 MG tablet, TAKE 1 TABLET BY MOUTH TWICE A DAY, Disp: 180 tablet, Rfl: 0   FLUoxetine (PROZAC) 10 MG capsule, Take 10 mg by mouth daily., Disp: , Rfl:    hydrOXYzine  (ATARAX ) 10 MG tablet, Take 5-10 mg by mouth daily as needed., Disp: , Rfl:    levocetirizine (XYZAL ) 5 MG tablet, TAKE 1 TABLET BY MOUTH EVERY DAY IN THE EVENING, Disp: 90 tablet, Rfl: 1   metFORMIN  (GLUCOPHAGE -XR) 500 MG 24 hr tablet, Take 2 tablets by mouth 2 (two) times daily., Disp: , Rfl:    montelukast  (SINGULAIR ) 10 MG tablet, TAKE 1 TABLET BY MOUTH EVERYDAY AT BEDTIME, Disp: 90 tablet, Rfl: 0   NALTREXONE HCL PO, Take 1.5 mg by mouth daily., Disp: , Rfl:    Omega-3 1000 MG CAPS, Take 1 capsule by mouth every morning., Disp: , Rfl:    pantoprazole  (PROTONIX ) 40 MG tablet, TAKE 1 TABLET BY MOUTH EVERY DAY, Disp: 90 tablet, Rfl: 3   pregabalin  (LYRICA ) 50 MG capsule, Take 1 capsule (50 mg total) by mouth at bedtime., Disp: 90 capsule, Rfl: 2    SYRINGE-NEEDLE, DISP, 3 ML (B-D 3CC LUER-LOK SYR 20GX1) 20G X 1 3 ML MISC, Use to inject B12 monthly intramuscular., Disp: 50 each, Rfl: 4   trazodone  (DESYREL ) 300 MG tablet, Take 50 mg by mouth at bedtime., Disp: , Rfl:   Observations/Objective: Patient is well-developed, well-nourished in no acute distress.  Resting comfortably  at home.  Head is normocephalic, atraumatic.  No labored breathing.  Speech is clear and coherent with logical content.  Patient is alert and oriented at baseline.  Cough noted Congestion present   Assessment and Plan:  1. Acute bacterial sinusitis (Primary)  - amoxicillin -clavulanate (AUGMENTIN ) 875-125 MG tablet; Take 1 tablet by mouth 2 (two) times daily for 7 days.  Dispense: 14 tablet; Refill: 0  2. Acute cough  - albuterol  (VENTOLIN  HFA) 108 (90 Base) MCG/ACT inhaler; Inhale 1-2 puffs into the lungs every 4 (four) hours as needed for wheezing or shortness of breath (cough).  Dispense: 8 g; Refill: 0  - Increased rest - Increasing  Fluids - Acetaminophen  / ibuprofen as needed for fever/pain.  - Salt water gargling, chloraseptic spray and throat lozenges - Mucinex if mucus is present and increasing.  - Saline nasal spray if congestion or if nasal passages feel dry. - Humidifying the air.    Reviewed side effects, risks and benefits of medication.    Patient acknowledged agreement and understanding of the plan.   Past Medical, Surgical, Social History, Allergies, and Medications have been Reviewed.    Follow Up Instructions: I discussed the assessment and treatment plan with the patient. The patient was provided an opportunity to ask questions and all were answered. The patient agreed with the plan and demonstrated an understanding of the instructions.  A copy of instructions were sent to the patient via MyChart unless otherwise noted below.    The patient was advised to call back or seek an in-person evaluation if the symptoms worsen or if  the condition fails to improve as anticipated.    Chiquita CHRISTELLA Barefoot, NP

## 2023-11-29 ENCOUNTER — Ambulatory Visit: Payer: Medicaid Other | Admitting: Nurse Practitioner

## 2023-12-01 DIAGNOSIS — G909 Disorder of the autonomic nervous system, unspecified: Secondary | ICD-10-CM | POA: Diagnosis not present

## 2023-12-01 DIAGNOSIS — D8943 Secondary mast cell activation: Secondary | ICD-10-CM | POA: Diagnosis not present

## 2023-12-13 ENCOUNTER — Ambulatory Visit (INDEPENDENT_AMBULATORY_CARE_PROVIDER_SITE_OTHER): Payer: 59 | Admitting: Nurse Practitioner

## 2023-12-13 ENCOUNTER — Encounter: Payer: Self-pay | Admitting: Nurse Practitioner

## 2023-12-13 VITALS — BP 119/71 | HR 85 | Temp 98.8°F | Wt 234.4 lb

## 2023-12-13 DIAGNOSIS — J069 Acute upper respiratory infection, unspecified: Secondary | ICD-10-CM

## 2023-12-13 MED ORDER — PREDNISONE 20 MG PO TABS
40.0000 mg | ORAL_TABLET | Freq: Every day | ORAL | 0 refills | Status: AC
Start: 2023-12-13 — End: 2023-12-18

## 2023-12-13 NOTE — Patient Instructions (Signed)
Zyrtec and Xyzal  Sinus Infection, Adult A sinus infection is soreness and swelling (inflammation) of your sinuses. Sinuses are hollow spaces in the bones around your face. They are located: Around your eyes. In the middle of your forehead. Behind your nose. In your cheekbones. Your sinuses and nasal passages are lined with a fluid called mucus. Mucus drains out of your sinuses. Swelling can trap mucus in your sinuses. This lets germs (bacteria, virus, or fungus) grow, which leads to infection. Most of the time, this condition is caused by a virus. What are the causes? Allergies. Asthma. Germs. Things that block your nose or sinuses. Growths in the nose (nasal polyps). Chemicals or irritants in the air. A fungus. This is rare. What increases the risk? Having a weak body defense system (immune system). Doing a lot of swimming or diving. Using nasal sprays too much. Smoking. What are the signs or symptoms? The main symptoms of this condition are pain and a feeling of pressure around the sinuses. Other symptoms include: Stuffy nose (congestion). This may make it hard to breathe through your nose. Runny nose (drainage). Soreness, swelling, and warmth in the sinuses. A cough that may get worse at night. Being unable to smell and taste. Mucus that collects in the throat or the back of the nose (postnasal drip). This may cause a sore throat or bad breath. Being very tired (fatigued). A fever. How is this diagnosed? Your symptoms. Your medical history. A physical exam. Tests to find out if your condition is short-term (acute) or long-term (chronic). Your doctor may: Check your nose for growths (polyps). Check your sinuses using a tool that has a light on one end (endoscope). Check for allergies or germs. Do imaging tests, such as an MRI or CT scan. How is this treated? Treatment for this condition depends on the cause and whether it is short-term or long-term. If caused by a virus,  your symptoms should go away on their own within 10 days. You may be given medicines to relieve symptoms. They include: Medicines that shrink swollen tissue in the nose. A spray that treats swelling of the nostrils. Rinses that help get rid of thick mucus in your nose (nasal saline washes). Medicines that treat allergies (antihistamines). Over-the-counter pain relievers. If caused by bacteria, your doctor may wait to see if you will get better without treatment. You may be given antibiotic medicine if you have: A very bad infection. A weak body defense system. If caused by growths in the nose, surgery may be needed. Follow these instructions at home: Medicines Take, use, or apply over-the-counter and prescription medicines only as told by your doctor. These may include nasal sprays. If you were prescribed an antibiotic medicine, take it as told by your doctor. Do not stop taking it even if you start to feel better. Hydrate and humidify  Drink enough water to keep your pee (urine) pale yellow. Use a cool mist humidifier to keep the humidity level in your home above 50%. Breathe in steam for 10-15 minutes, 3-4 times a day, or as told by your doctor. You can do this in the bathroom while a hot shower is running. Try not to spend time in cool or dry air. Rest Rest as much as you can. Sleep with your head raised (elevated). Make sure you get enough sleep each night. General instructions  Put a warm, moist washcloth on your face 3-4 times a day, or as often as told by your doctor. Use nasal saline  washes as often as told by your doctor. Wash your hands often with soap and water. If you cannot use soap and water, use hand sanitizer. Do not smoke. Avoid being around people who are smoking (secondhand smoke). Keep all follow-up visits. Contact a doctor if: You have a fever. Your symptoms get worse. Your symptoms do not get better within 10 days. Get help right away if: You have a very bad  headache. You cannot stop vomiting. You have very bad pain or swelling around your face or eyes. You have trouble seeing. You feel confused. Your neck is stiff. You have trouble breathing. These symptoms may be an emergency. Get help right away. Call 911. Do not wait to see if the symptoms will go away. Do not drive yourself to the hospital. Summary A sinus infection is swelling of your sinuses. Sinuses are hollow spaces in the bones around your face. This condition is caused by tissues in your nose that become inflamed or swollen. This traps germs. These can lead to infection. If you were prescribed an antibiotic medicine, take it as told by your doctor. Do not stop taking it even if you start to feel better. Keep all follow-up visits. This information is not intended to replace advice given to you by your health care provider. Make sure you discuss any questions you have with your health care provider. Document Revised: 10/06/2021 Document Reviewed: 10/06/2021 Elsevier Patient Education  2024 [employer].

## 2023-12-13 NOTE — Assessment & Plan Note (Signed)
Acute, recent treatment with Augmentin for sinus infection but continues to have some discomfort to sinuses and inflammation.  Discussed with patient.  Has underlying Ehler's Danlos.  She would like to try Prednisone 40 MG daily for 5 days and if any issues to alert provider.  Recommend she hold Flonase if any nasal bleeding and use saline spray only.  Recommend: - Increased rest - Increasing Fluids - Acetaminophen / ibuprofen as needed for fever/pain.  - Salt water gargling, chloraseptic spray and throat lozenges - OTC allergy medication regimen - Saline sinus flushes or a neti pot.  - Humidifying the air.

## 2023-12-13 NOTE — Progress Notes (Signed)
BP 119/71   Pulse 85   Temp 98.8 F (37.1 C) (Oral)   Wt 234 lb 6.4 oz (106.3 kg)   SpO2 97%   BMI 32.69 kg/m    Subjective:    Patient ID: Stacy West, female    DOB: 1981/09/08, 43 y.o.   MRN: [id number]  HPI: Stacy West is a 43 y.o. female  Chief Complaint  Patient presents with   URI   UPPER RESPIRATORY TRACT INFECTION Symptoms have been present for ongoing sinus issues.  Treated with Augmentin by virtual visit on 11/24/23.  Now feels like she has swimmer's ear and has dry cough + a little blood when blows nose.   Fever: no Cough: yes Shortness of breath: no Wheezing: no Chest pain: no Chest tightness: no Chest congestion: no Nasal congestion: no Runny nose: yes Post nasal drip: yes Sneezing: no Sore throat:  scratchy throat Swollen glands: no Sinus pressure:  a little Headache: yes Face pain: no Toothache: no Ear pain: yes bilateral Ear pressure: yes bilateral Eyes red/itching:yes Eye drainage/crusting: no  Vomiting: no Rash: no Fatigue: yes Sick contacts: yes Strep contacts: no  Context: fluctuating Recurrent sinusitis: no Relief with OTC cold/cough medications: yes  Treatments attempted: Augmentin, Albuterol,  Zyrtec, Singulair, Xyzal  Relevant past medical, surgical, family and social history reviewed and updated as indicated. Interim medical history since our last visit reviewed. Allergies and medications reviewed and updated.  Review of Systems  Constitutional:  Positive for fatigue. Negative for activity change, appetite change, chills and fever.  HENT:  Positive for postnasal drip, rhinorrhea, sinus pressure and sore throat (scratchy throat). Negative for congestion, ear discharge, ear pain, facial swelling, sinus pain, sneezing and voice change.   Respiratory:  Positive for cough. Negative for chest tightness, shortness of breath and wheezing.   Cardiovascular:  Negative for chest pain, palpitations and leg swelling.  Gastrointestinal:  Negative.   Endocrine: Negative.   Neurological:  Positive for headaches. Negative for dizziness and numbness.  Psychiatric/Behavioral: Negative.      Per HPI unless specifically indicated above    Objective:    BP 119/71   Pulse 85   Temp 98.8 F (37.1 C) (Oral)   Wt 234 lb 6.4 oz (106.3 kg)   SpO2 97%   BMI 32.69 kg/m   Wt Readings from Last 3 Encounters:  12/13/23 234 lb 6.4 oz (106.3 kg)  10/06/23 230 lb (104.3 kg)  08/31/23 220 lb (99.8 kg)    Physical Exam Vitals and nursing note reviewed.  Constitutional:      General: She is awake. She is not in acute distress.    Appearance: She is well-developed and well-groomed. She is obese. She is not ill-appearing or toxic-appearing.  HENT:     Head: Normocephalic.     Right Ear: Hearing, ear canal and external ear normal. A middle ear effusion is present. Tympanic membrane is not injected or perforated.     Left Ear: Hearing, ear canal and external ear normal. A middle ear effusion is present. Tympanic membrane is not injected or perforated.     Ears:     Comments: Very mild erythema to both ear canals.    Nose: Rhinorrhea present. Rhinorrhea is clear.     Right Sinus: No maxillary sinus tenderness or frontal sinus tenderness.     Left Sinus: No maxillary sinus tenderness or frontal sinus tenderness.     Mouth/Throat:     Mouth: Mucous membranes are moist.  Pharynx: Posterior oropharyngeal erythema (cobblestone pattern) present. No pharyngeal swelling or oropharyngeal exudate.  Eyes:     General: Lids are normal.        Right eye: No discharge.        Left eye: No discharge.     Conjunctiva/sclera: Conjunctivae normal.     Pupils: Pupils are equal, round, and reactive to light.  Neck:     Thyroid: No thyromegaly.     Vascular: No carotid bruit.  Cardiovascular:     Rate and Rhythm: Normal rate and regular rhythm.     Heart sounds: Normal heart sounds. No murmur heard.    No gallop.  Pulmonary:     Effort:  Pulmonary effort is normal. No accessory muscle usage or respiratory distress.     Breath sounds: Normal breath sounds. No decreased breath sounds, wheezing or rhonchi.  Abdominal:     General: Bowel sounds are normal.     Palpations: Abdomen is soft. There is no hepatomegaly or splenomegaly.  Musculoskeletal:     Cervical back: Normal range of motion and neck supple.     Right lower leg: No edema.     Left lower leg: No edema.  Lymphadenopathy:     Head:     Right side of head: Submandibular adenopathy present. No submental, tonsillar, preauricular or posterior auricular adenopathy.     Left side of head: Submandibular adenopathy present. No submental, tonsillar, preauricular or posterior auricular adenopathy.     Cervical: No cervical adenopathy.  Skin:    General: Skin is warm and dry.  Neurological:     Mental Status: She is alert and oriented to person, place, and time.  Psychiatric:        Attention and Perception: Attention normal.        Mood and Affect: Mood normal.        Speech: Speech normal.        Behavior: Behavior normal. Behavior is cooperative.        Thought Content: Thought content normal.    Results for orders placed or performed in visit on 06/08/23  Pregnancy, urine   Collection Time: 06/08/23  8:44 AM  Result Value Ref Range   Preg Test, Ur Negative Negative  Beta hCG quant (ref lab)   Collection Time: 06/08/23  8:45 AM  Result Value Ref Range   hCG Quant 3 mIU/mL      Assessment & Plan:   Problem List Items Addressed This Visit       Respiratory   URI (upper respiratory infection) - Primary   Acute, recent treatment with Augmentin for sinus infection but continues to have some discomfort to sinuses and inflammation.  Discussed with patient.  Has underlying Ehler's Danlos.  She would like to try Prednisone 40 MG daily for 5 days and if any issues to alert provider.  Recommend she hold Flonase if any nasal bleeding and use saline spray only.   Recommend: - Increased rest - Increasing Fluids - Acetaminophen / ibuprofen as needed for fever/pain.  - Salt water gargling, chloraseptic spray and throat lozenges - OTC allergy medication regimen - Saline sinus flushes or a neti pot.  - Humidifying the air.         Follow up plan: Return if symptoms worsen or fail to improve.

## 2024-01-07 ENCOUNTER — Other Ambulatory Visit: Payer: Self-pay | Admitting: Nurse Practitioner

## 2024-01-07 DIAGNOSIS — D8940 Mast cell activation, unspecified: Secondary | ICD-10-CM

## 2024-01-08 ENCOUNTER — Observation Stay
Admission: EM | Admit: 2024-01-08 | Discharge: 2024-01-09 | Disposition: A | Discharge status: Home / Self Care | Payer: 59 | Attending: Internal Medicine | Admitting: Internal Medicine

## 2024-01-08 ENCOUNTER — Other Ambulatory Visit: Payer: Self-pay

## 2024-01-08 ENCOUNTER — Emergency Department: Payer: 59

## 2024-01-08 DIAGNOSIS — Z79899 Other long term (current) drug therapy: Secondary | ICD-10-CM | POA: Diagnosis not present

## 2024-01-08 DIAGNOSIS — Z7901 Long term (current) use of anticoagulants: Secondary | ICD-10-CM | POA: Insufficient documentation

## 2024-01-08 DIAGNOSIS — K219 Gastro-esophageal reflux disease without esophagitis: Secondary | ICD-10-CM | POA: Diagnosis present

## 2024-01-08 DIAGNOSIS — Z87891 Personal history of nicotine dependence: Secondary | ICD-10-CM | POA: Diagnosis not present

## 2024-01-08 DIAGNOSIS — F419 Anxiety disorder, unspecified: Secondary | ICD-10-CM | POA: Insufficient documentation

## 2024-01-08 DIAGNOSIS — F418 Other specified anxiety disorders: Secondary | ICD-10-CM

## 2024-01-08 DIAGNOSIS — Z1152 Encounter for screening for COVID-19: Secondary | ICD-10-CM | POA: Diagnosis not present

## 2024-01-08 DIAGNOSIS — I9589 Other hypotension: Secondary | ICD-10-CM | POA: Diagnosis not present

## 2024-01-08 DIAGNOSIS — R2689 Other abnormalities of gait and mobility: Secondary | ICD-10-CM | POA: Insufficient documentation

## 2024-01-08 DIAGNOSIS — R404 Transient alteration of awareness: Secondary | ICD-10-CM | POA: Diagnosis not present

## 2024-01-08 DIAGNOSIS — F32A Depression, unspecified: Secondary | ICD-10-CM | POA: Diagnosis not present

## 2024-01-08 DIAGNOSIS — R001 Bradycardia, unspecified: Secondary | ICD-10-CM | POA: Diagnosis not present

## 2024-01-08 DIAGNOSIS — G894 Chronic pain syndrome: Secondary | ICD-10-CM | POA: Diagnosis not present

## 2024-01-08 DIAGNOSIS — Z6830 Body mass index (BMI) 30.0-30.9, adult: Secondary | ICD-10-CM | POA: Insufficient documentation

## 2024-01-08 DIAGNOSIS — Z743 Need for continuous supervision: Secondary | ICD-10-CM | POA: Diagnosis not present

## 2024-01-08 DIAGNOSIS — E669 Obesity, unspecified: Secondary | ICD-10-CM | POA: Diagnosis not present

## 2024-01-08 DIAGNOSIS — I499 Cardiac arrhythmia, unspecified: Secondary | ICD-10-CM | POA: Diagnosis not present

## 2024-01-08 DIAGNOSIS — K2100 Gastro-esophageal reflux disease with esophagitis, without bleeding: Secondary | ICD-10-CM

## 2024-01-08 DIAGNOSIS — E282 Polycystic ovarian syndrome: Secondary | ICD-10-CM | POA: Insufficient documentation

## 2024-01-08 DIAGNOSIS — R112 Nausea with vomiting, unspecified: Secondary | ICD-10-CM | POA: Diagnosis not present

## 2024-01-08 DIAGNOSIS — R6889 Other general symptoms and signs: Secondary | ICD-10-CM | POA: Diagnosis not present

## 2024-01-08 DIAGNOSIS — Z8742 Personal history of other diseases of the female genital tract: Secondary | ICD-10-CM

## 2024-01-08 LAB — TROPONIN I (HIGH SENSITIVITY)
Troponin I (High Sensitivity): <2 ng/L (ref <18)
Troponin I (High Sensitivity): <2 ng/L (ref <18)

## 2024-01-08 LAB — URINALYSIS, ROUTINE W REFLEX MICROSCOPIC
Bilirubin Urine: NEGATIVE
Glucose, UA: NEGATIVE mg/dL
Hgb urine dipstick: NEGATIVE
Ketones, ur: 20 mg/dL — AB
Nitrite: NEGATIVE
Protein, ur: NEGATIVE mg/dL
Specific Gravity, Urine: 1.024 (ref 1.005–1.030)
WBC, UA: 0 WBC/hpf (ref 0–5)
pH: 7 (ref 5.0–8.0)

## 2024-01-08 LAB — URINE DRUG SCREEN, QUALITATIVE (ARMC ONLY)
Amphetamines, Ur Screen: NOT DETECTED
Barbiturates, Ur Screen: NOT DETECTED
Benzodiazepine, Ur Scrn: NOT DETECTED
Cannabinoid 50 Ng, Ur ~~LOC~~: POSITIVE — AB
Cocaine Metabolite,Ur ~~LOC~~: NOT DETECTED
MDMA (Ecstasy)Ur Screen: NOT DETECTED
Methadone Scn, Ur: NOT DETECTED
Opiate, Ur Screen: NOT DETECTED
Phencyclidine (PCP) Ur S: NOT DETECTED
Tricyclic, Ur Screen: NOT DETECTED

## 2024-01-08 LAB — COMPREHENSIVE METABOLIC PANEL
ALT: 20 U/L (ref 0–44)
AST: 19 U/L (ref 15–41)
Albumin: 4.1 g/dL (ref 3.5–5.0)
Alkaline Phosphatase: 46 U/L (ref 38–126)
Anion gap: 12 (ref 5–15)
BUN: 14 mg/dL (ref 6–20)
CO2: 22 mmol/L (ref 22–32)
Calcium: 9.5 mg/dL (ref 8.9–10.3)
Chloride: 105 mmol/L (ref 98–111)
Creatinine, Ser: 0.82 mg/dL (ref 0.44–1.00)
GFR, Estimated: >60 mL/min (ref >60)
Glucose, Bld: 107 mg/dL — ABNORMAL HIGH (ref 70–99)
Potassium: 3.6 mmol/L (ref 3.5–5.1)
Sodium: 139 mmol/L (ref 135–145)
Total Bilirubin: 0.7 mg/dL (ref 0.0–1.2)
Total Protein: 6.4 g/dL — ABNORMAL LOW (ref 6.5–8.1)

## 2024-01-08 LAB — CBC WITH DIFFERENTIAL/PLATELET
Abs Immature Granulocytes: 0.04 10*3/uL (ref 0.00–0.07)
Basophils Absolute: 0 10*3/uL (ref 0.0–0.1)
Basophils Relative: 0 %
Eosinophils Absolute: 0 10*3/uL (ref 0.0–0.5)
Eosinophils Relative: 0 %
HCT: 38.8 % (ref 36.0–46.0)
Hemoglobin: 13.5 g/dL (ref 12.0–15.0)
Immature Granulocytes: 0 %
Lymphocytes Relative: 19 %
Lymphs Abs: 2 10*3/uL (ref 0.7–4.0)
MCH: 31.8 pg (ref 26.0–34.0)
MCHC: 34.8 g/dL (ref 30.0–36.0)
MCV: 91.3 fL (ref 80.0–100.0)
Monocytes Absolute: 0.6 10*3/uL (ref 0.1–1.0)
Monocytes Relative: 5 %
Neutro Abs: 7.6 10*3/uL (ref 1.7–7.7)
Neutrophils Relative %: 76 %
Platelets: 319 10*3/uL (ref 150–400)
RBC: 4.25 MIL/uL (ref 3.87–5.11)
RDW: 12.8 % (ref 11.5–15.5)
WBC: 10.3 10*3/uL (ref 4.0–10.5)
nRBC: 0 % (ref 0.0–0.2)

## 2024-01-08 LAB — T4, FREE: Free T4: 0.87 ng/dL (ref 0.61–1.12)

## 2024-01-08 LAB — LIPASE, BLOOD: Lipase: 35 U/L (ref 11–51)

## 2024-01-08 LAB — RESP PANEL BY RT-PCR (RSV, FLU A&B, COVID)  RVPGX2
Influenza A by PCR: NEGATIVE
Influenza B by PCR: NEGATIVE
Resp Syncytial Virus by PCR: NEGATIVE
SARS Coronavirus 2 by RT PCR: NEGATIVE

## 2024-01-08 LAB — POC URINE PREG, ED: Preg Test, Ur: NEGATIVE

## 2024-01-08 LAB — PHOSPHORUS
Phosphorus: 1.3 mg/dL — ABNORMAL LOW (ref 2.5–4.6)
Phosphorus: 3.5 mg/dL (ref 2.5–4.6)

## 2024-01-08 LAB — MAGNESIUM: Magnesium: 2.1 mg/dL (ref 1.7–2.4)

## 2024-01-08 LAB — TSH: TSH: 1.038 u[IU]/mL (ref 0.350–4.500)

## 2024-01-08 MED ORDER — ALBUTEROL SULFATE (2.5 MG/3ML) 0.083% IN NEBU: 2.5000 mg | INHALATION_SOLUTION | RESPIRATORY_TRACT | Status: DC | PRN

## 2024-01-08 MED ORDER — CYCLOBENZAPRINE HCL 10 MG PO TABS
10.0000 mg | ORAL_TABLET | Freq: Three times a day (TID) | ORAL | Status: DC | PRN
  Administered 2024-01-09: 10 mg via ORAL
  Filled 2024-01-08: qty 1

## 2024-01-08 MED ORDER — SODIUM CHLORIDE 0.9 % IV SOLN: INTRAVENOUS | Status: DC

## 2024-01-08 MED ORDER — POTASSIUM & SODIUM PHOSPHATES 280-160-250 MG PO PACK
2.0000 | PACK | ORAL | Status: DC
  Filled 2024-01-08 (×3): qty 2

## 2024-01-08 MED ORDER — DIPHENHYDRAMINE HCL 50 MG/ML IJ SOLN
25.0000 mg | INTRAMUSCULAR | Status: AC
  Administered 2024-01-08: 25 mg via INTRAVENOUS
  Filled 2024-01-08: qty 1

## 2024-01-08 MED ORDER — ALBUTEROL SULFATE HFA 108 (90 BASE) MCG/ACT IN AERS: 2.0000 | INHALATION_SPRAY | RESPIRATORY_TRACT | Status: DC | PRN

## 2024-01-08 MED ORDER — ONDANSETRON HCL 4 MG/2ML IJ SOLN
4.0000 mg | Freq: Three times a day (TID) | INTRAMUSCULAR | Status: DC | PRN
  Administered 2024-01-09: 4 mg via INTRAVENOUS
  Filled 2024-01-08: qty 2

## 2024-01-08 MED ORDER — ENOXAPARIN SODIUM 60 MG/0.6ML IJ SOSY
55.0000 mg | PREFILLED_SYRINGE | INTRAMUSCULAR | Status: DC
  Administered 2024-01-09: 55 mg via SUBCUTANEOUS
  Filled 2024-01-08: qty 0.6

## 2024-01-08 MED ORDER — ONDANSETRON HCL 4 MG/2ML IJ SOLN
4.0000 mg | Freq: Once | INTRAMUSCULAR | Status: AC
Start: 2024-01-08 — End: 2024-01-08
  Administered 2024-01-08: 4 mg via INTRAVENOUS
  Filled 2024-01-08: qty 2

## 2024-01-08 MED ORDER — ACETAMINOPHEN 325 MG PO TABS
650.0000 mg | ORAL_TABLET | Freq: Four times a day (QID) | ORAL | Status: DC | PRN
  Administered 2024-01-09: 650 mg via ORAL
  Filled 2024-01-08: qty 2

## 2024-01-08 MED ORDER — POTASSIUM PHOSPHATES 15 MMOLE/5ML IV SOLN
30.0000 mmol | Freq: Once | INTRAVENOUS | Status: DC
  Administered 2024-01-09: 30 mmol via INTRAVENOUS
  Filled 2024-01-08: qty 10

## 2024-01-08 MED ORDER — SODIUM CHLORIDE 0.9 % IV BOLUS
1000.0000 mL | Freq: Once | INTRAVENOUS | Status: AC
  Administered 2024-01-08: 1000 mL via INTRAVENOUS

## 2024-01-08 MED ORDER — PROGESTERONE MICRONIZED 100 MG PO CAPS
100.0000 mg | ORAL_CAPSULE | Freq: Every day | ORAL | Status: DC
  Administered 2024-01-09: 100 mg via ORAL
  Filled 2024-01-08: qty 1

## 2024-01-08 MED ORDER — METOCLOPRAMIDE HCL 5 MG/ML IJ SOLN
10.0000 mg | INTRAMUSCULAR | Status: AC
  Administered 2024-01-08: 10 mg via INTRAVENOUS
  Filled 2024-01-08: qty 2

## 2024-01-08 MED ORDER — TRAZODONE HCL 100 MG PO TABS
300.0000 mg | ORAL_TABLET | Freq: Every day | ORAL | Status: DC
  Administered 2024-01-09: 300 mg via ORAL
  Filled 2024-01-08: qty 3

## 2024-01-08 NOTE — ED Triage Notes (Signed)
 Pt to ed from home via [facility] for N/V that started about an hour ago. Pt is caox4 and in no acute distress. Pt thinks she might have eaten something bad. Pt given 4mg  IV zofran and 500 NS by EMS. Vitals : 108/64  HR 52 100% RA  109 BGL

## 2024-01-08 NOTE — ED Provider Notes (Signed)
 [facility] Provider Note    Event Date/Time   First MD Initiated Contact with Patient 01/08/24 1647     (approximate)   History   Chief Complaint: Emesis   HPI  Stacy West is a 43 y.o. female with a history of Ehlers-Danlos syndrome, PCOS, fibromyalgia who comes ED complaining of nausea and vomiting that started about 3:30 PM after eating fast food.  Denies abdominal pain chest pain shortness of breath or fever.  Feels very dizzy which is worse with movement and sitting up.   Outside record review reveals that patient had Holter monitor completed April 19, 2023 where she had a minimum heart rate of 47 with an average heart rate during the observation period of 82.  Multiple recent clinic visits have shown her heart rate is usually 80-90.  She has been evaluated for POTS.  She had an EP tilt table test which was normal.       Physical Exam   Triage Vital Signs: ED Triage Vitals  Encounter Vitals Group     BP 01/08/24 1633 (!) 113/59     Systolic BP Percentile --      Diastolic BP Percentile --      Pulse Rate 01/08/24 1633 (!) 40     Resp 01/08/24 1633 16     Temp 01/08/24 1701 (!) 97.5 F (36.4 C)     Temp Source 01/08/24 1701 Oral     SpO2 01/08/24 1633 100 %     Weight 01/08/24 1631 233 lb 11 oz (106 kg)     Height 01/08/24 1631 5\' 11"  (1.803 m)     Head Circumference --      Peak Flow --      Pain Score 01/08/24 1630 0     Pain Loc --      Pain Education --      Exclude from Growth Chart --     Most recent vital signs: Vitals:   01/08/24 1930 01/08/24 2230  BP:  97/63  Pulse: (!) 49   Resp: 14 18  Temp:    SpO2: 100%     General: Awake, no distress.  CV:  Good peripheral perfusion.  Bradycardia heart rate 40.  Normal distal pulses Resp:  Normal effort.  Clear to auscultation bilaterally Abd:  No distention.  Soft nontender.  No CVA tenderness Other:  Moving all extremities, no focal neurologic deficits   ED Results /  Procedures / Treatments   Labs (all labs ordered are listed, but only abnormal results are displayed) Labs Reviewed  COMPREHENSIVE METABOLIC PANEL - Abnormal; Notable for the following components:      Result Value   Glucose, Bld 107 (*)    Total Protein 6.4 (*)    All other components within normal limits  URINALYSIS, ROUTINE W REFLEX MICROSCOPIC - Abnormal; Notable for the following components:   Color, Urine YELLOW (*)    APPearance CLOUDY (*)    Ketones, ur 20 (*)    Leukocytes,Ua TRACE (*)    Bacteria, UA RARE (*)    All other components within normal limits  PHOSPHORUS - Abnormal; Notable for the following components:   Phosphorus 1.3 (*)    All other components within normal limits  RESP PANEL BY RT-PCR (RSV, FLU A&B, COVID)  RVPGX2  LIPASE, BLOOD  CBC WITH DIFFERENTIAL/PLATELET  TSH  T4, FREE  MAGNESIUM  URINE DRUG SCREEN, QUALITATIVE ([facility] ONLY)  POC URINE PREG, ED  TROPONIN I (HIGH SENSITIVITY)  TROPONIN I (HIGH SENSITIVITY)     EKG Interpreted by me Sinus bradycardia rate of 47.  Normal axis, normal intervals.  Normal QRS ST segments and T waves   RADIOLOGY MRI brain interpreted by me, no obvious mass or infarct.  Radiology report reviewed   PROCEDURES:  Procedures   MEDICATIONS ORDERED IN ED: Medications  potassium PHOSPHATE 30 mmol in dextrose 5 % 500 mL infusion (has no administration in time range)  ondansetron (ZOFRAN) injection 4 mg (has no administration in time range)  acetaminophen (TYLENOL) tablet 650 mg (has no administration in time range)  0.9 %  sodium chloride infusion (has no administration in time range)  albuterol (VENTOLIN HFA) 108 (90 Base) MCG/ACT inhaler 2 puff (has no administration in time range)  sodium chloride 0.9 % bolus 1,000 mL (0 mLs Intravenous Stopped 01/08/24 1907)  ondansetron (ZOFRAN) injection 4 mg (4 mg Intravenous Given 01/08/24 1707)  metoCLOPramide (REGLAN) injection 10 mg (10 mg Intravenous Given 01/08/24 1954)   sodium chloride 0.9 % bolus 1,000 mL (0 mLs Intravenous Stopped 01/08/24 2233)  diphenhydrAMINE (BENADRYL) injection 25 mg (25 mg Intravenous Given 01/08/24 1952)     IMPRESSION / MDM / ASSESSMENT AND PLAN / ED COURSE  I reviewed the triage vital signs and the nursing notes.  DDx: Dehydration, foodborne illness, viral syndrome, electrolyte derangement, anemia, hypothyroidism, pancreatitis, UTI, posterior stroke  Patient's presentation is most consistent with acute presentation with potential threat to life or bodily function.     Clinical Course as of 01/08/24 2253  Wynelle Link Jan 08, 2024  1706 Patient presents with pallor, bradycardia, nausea vomiting, profound fatigue.  Will give IV fluids for hydration, Zofran for nausea.  Will check labs including TSH, troponin, electrolytes, viral panel. [PS]  1947 Lab panel unremarkable.  Vital signs stable, still bradycardic, still dizzy.  Will obtain MRI brain, give additional fluids Reglan Benadryl. [PS]    Clinical Course User Index [PS] Sharman Cheek, MD    ----------------------------------------- 10:51 PM on 01/08/2024 ----------------------------------------- MRI brain normal.  Phosphorus level added on which is markedly low at 1.3.  This could explain the patient's symptoms and bradycardia.  Will start replacement.  Case discussed with hospitalist for further management.   FINAL CLINICAL IMPRESSION(S) / ED DIAGNOSES   Final diagnoses:  Hypophosphatemia  Bradycardia     Rx / DC Orders   ED Discharge Orders     None        Note:  This document was prepared using Dragon voice recognition software and may include unintentional dictation errors.   Sharman Cheek, MD 01/08/24 2253

## 2024-01-08 NOTE — H&P (Incomplete)
 History and Physical    Janus Vlcek ZOX:[id number] DOB: 1981-03-19 DOA: 01/08/2024  Referring MD/NP/PA:   PCP: Marjie Skiff, NP   Patient coming from:  The patient is coming from home.     Chief Complaint: Nausea, vomiting, dizziness  HPI: Stacy West is a 43 y.o. female with medical history significant of depression with anxiety, GERD, obesity, fibromyalgia, premature ovarian failure, PCOS, celiac disease, Ehlers-Danlos disease, OSA not on CPAP, who presents with nausea, vomiting, dizziness.  Patient states that she started having nausea, vomiting about 3:30 PM after eating fast food.  She has had 8 episode of nonbilious nonbloody vomiting.  No abdominal pain or diarrhea.  No fever or chills.  Patient has dizziness and feeling room spinning.  No hearing loss.  No chest pain, cough, SOB.  No symptoms of UTI. Denies abdominal pain chest pain shortness of breath or fever.  Feels very dizzy which is worse with movement and sitting up.   Data reviewed independently and ED Course: pt was found to have WBC 10.3, phosphorus 1.3, potassium 3.6, magnesium 2.1, negative PCR for COVID, flu and RSV, GFR> 60, TSH normal 1.038, free T40.87, troponin negative x 2, negative pregnancy test.  Temperature normal, blood pressure 97/63, heart rate 39/53, RR 18, oxygen saturation 100% on room air.  MRI of the brain is negative.  Patient is placed on telemetry bed for observation.   EKG: I have personally reviewed.  Sinus rhythm, QTc 440, HR 47, early R wave progression.   Review of Systems:   General: no fevers, chills, no body weight gain, has fatigue HEENT: no blurry vision, hearing changes or sore throat Respiratory: no dyspnea, coughing, wheezing CV: no chest pain, no palpitations GI: has nausea, vomiting, no abdominal pain, diarrhea, constipation GU: no dysuria, burning on urination, increased urinary frequency, hematuria  Ext: no leg edema Neuro: no unilateral weakness, numbness, or tingling,  no vision change or hearing loss. Has dizziness. Skin: no rash, no skin tear. MSK: No muscle spasm, no deformity, no limitation of range of movement in spin Heme: No easy bruising.  Travel history: No recent long distant travel.   Allergy:  Allergies  Allergen Reactions   Carisoprodol-Aspirin-Codeine Hives and Itching   Carisoprodol Dermatitis   Percocet  [Oxycodone-Acetaminophen] Dermatitis   Gluten Meal Hives and Nausea Only   Tramadol     Past Medical History:  Diagnosis Date   Abnormal Pap smear of cervix    Allergy    Celiac disease    Depression    Ehlers-Danlos disease    Fibromyalgia    Heart murmur    Insulin resistance    Miscarriage    Other cervical disc degeneration at C4-C5 level    PCOS (polycystic ovarian syndrome)    PCOS (polycystic ovarian syndrome)    Premature ovarian failure    Recurrent upper respiratory infection (URI)    SI (sacroiliac) joint dysfunction    Sleep apnea    Thyroid disease     Past Surgical History:  Procedure Laterality Date   ADENOIDECTOMY     BACK SURGERY     COLPOSCOPY     FINGER SURGERY Left    NECK SURGERY  07/2020   TONSILLECTOMY     WISDOM TOOTH EXTRACTION      Social History:  reports that she quit smoking about 7 years ago. Her smoking use included cigarettes. She has never used smokeless tobacco. She reports that she does not currently use alcohol. She reports that she does  not currently use drugs.  Family History:  Family History  Problem Relation Age of Onset   Heart murmur Mother    Asthma Mother    Skin cancer Father    COPD Father    Bipolar disorder Brother    Depression Brother    Alcoholism Brother    Depression Brother    Heart murmur Maternal Aunt    Breast cancer Maternal Aunt    Early menopause Maternal Aunt    Asthma Maternal Grandmother      Prior to Admission medications   Medication Sig Start Date End Date Taking? Authorizing Provider  guanFACINE (INTUNIV) 2 MG TB24 ER tablet Take  2 mg by mouth daily. 01/07/24  Yes [provider]  albuterol (VENTOLIN HFA) 108 (90 Base) MCG/ACT inhaler Inhale 1-2 puffs into the lungs every 4 (four) hours as needed for wheezing or shortness of breath (cough). 11/24/23   Freddy Finner, NP  cetirizine (ZYRTEC) 10 MG tablet TAKE 1 TABLET BY MOUTH EVERY DAY 09/29/23   Cannady, Corrie Dandy T, NP  Cholecalciferol 1.25 MG (50000 UT) TABS Take 1 tablet by mouth once a week. 04/29/23   Cannady, Corrie Dandy T, NP  cyanocobalamin (VITAMIN B12) 1000 MCG/ML injection Inject 1 mL (1,000 mcg total) into the muscle every 30 (thirty) days. 07/06/23   Cannady, Corrie Dandy T, NP  cyclobenzaprine (FLEXERIL) 10 MG tablet Take 1 tablet (10 mg total) by mouth 3 (three) times daily as needed for muscle spasms. 01/06/22   Cannady, Corrie Dandy T, NP  DULoxetine (CYMBALTA) 30 MG capsule Take 30 mg by mouth daily. 07/19/23   [provider]  famotidine (PEPCID) 20 MG tablet TAKE 1 TABLET BY MOUTH TWICE A DAY 08/22/23   Cannady, [name] T, NP  FLUoxetine (PROZAC) 10 MG capsule Take 10 mg by mouth daily. 08/05/23   [provider]  hydrOXYzine (ATARAX) 10 MG tablet Take 5-10 mg by mouth daily as needed. 11/28/22   [provider]  levocetirizine (XYZAL) 5 MG tablet TAKE 1 TABLET BY MOUTH EVERY DAY IN THE EVENING 10/21/23   Cannady, Corrie Dandy T, NP  metFORMIN (GLUCOPHAGE-XR) 500 MG 24 hr tablet Take 2 tablets by mouth 2 (two) times daily. 04/23/22   [provider]  montelukast (SINGULAIR) 10 MG tablet TAKE 1 TABLET BY MOUTH EVERYDAY AT BEDTIME 08/22/23   Cannady, [name] T, NP  NALTREXONE HCL PO Take 1.5 mg by mouth daily.    [provider]  Omega-3 1000 MG CAPS Take 1 capsule by mouth every morning.    [provider]  pantoprazole (PROTONIX) 40 MG tablet TAKE 1 TABLET BY MOUTH EVERY DAY 07/04/23   Cannady, Corrie Dandy T, NP  pregabalin (LYRICA) 50 MG capsule Take 1 capsule (50 mg total) by mouth at bedtime. 07/22/23   Rodolph Bong, MD  SYRINGE-NEEDLE,  DISP, 3 ML (B-D 3CC LUER-LOK SYR 20GX1") 20G X 1" 3 ML MISC Use to inject B12 monthly intramuscular. 04/29/23   Cannady, Corrie Dandy T, NP  trazodone (DESYREL) 300 MG tablet Take 50 mg by mouth at bedtime.    [provider]    Physical Exam: Vitals:   01/08/24 2230 01/08/24 2255 01/08/24 2300 01/08/24 2344  BP: 97/63  95/60 111/71  Pulse:   (!) 49 (!) 55  Resp: 18  (!) 8 16  Temp:  98 F (36.7 C)  (!) 97.4 F (36.3 C)  TempSrc:  Oral  Oral  SpO2:   100% 100%  Weight:      Height:  General: Not in acute distress HEENT:       Eyes: PERRL, EOMI, no jaundice       ENT: No discharge from the ears and nose, no pharynx injection, no tonsillar enlargement.        Neck: No JVD, no bruit, no mass felt. Heme: No neck lymph node enlargement. Cardiac: S1/S2, RRR, No gallops or rubs. Respiratory: No rales, wheezing, rhonchi or rubs. GI: Soft, nondistended, nontender, no rebound pain, no organomegaly, BS present. GU: No hematuria Ext: No pitting leg edema bilaterally. 1+DP/PT pulse bilaterally. Musculoskeletal: No joint deformities, No joint redness or warmth, no limitation of ROM in spin. Skin: No rashes.  Neuro: Alert, oriented X3, cranial nerves II-XII grossly intact, moves all extremities normally.  Psych: Patient is not psychotic, no suicidal or hemocidal ideation.  Labs on Admission: I have personally reviewed following labs and imaging studies  CBC: Recent Labs  Lab 01/08/24 1700  WBC 10.3  NEUTROABS 7.6  HGB 13.5  HCT 38.8  MCV 91.3  PLT 319   Basic Metabolic Panel: Recent Labs  Lab 01/08/24 1700 01/08/24 2317  NA 139  --   K 3.6  --   CL 105  --   CO2 22  --   GLUCOSE 107*  --   BUN 14  --   CREATININE 0.82  --   CALCIUM 9.5  --   MG 2.1  --   PHOS 1.3* 3.5   GFR: Estimated Creatinine Clearance: 122.2 mL/min (by C-G formula based on SCr of 0.82 mg/dL). Liver Function Tests: Recent Labs  Lab 01/08/24 1700  AST 19  ALT 20  ALKPHOS 46  BILITOT  0.7  PROT 6.4*  ALBUMIN 4.1   Recent Labs  Lab 01/08/24 1700  LIPASE 35   No results for input(s): "AMMONIA" in the last 168 hours. Coagulation Profile: No results for input(s): "INR", "PROTIME" in the last 168 hours. Cardiac Enzymes: No results for input(s): "CKTOTAL", "CKMB", "CKMBINDEX", "TROPONINI" in the last 168 hours. BNP (last 3 results) No results for input(s): "PROBNP" in the last 8760 hours. HbA1C: No results for input(s): "HGBA1C" in the last 72 hours. CBG: No results for input(s): "GLUCAP" in the last 168 hours. Lipid Profile: No results for input(s): "CHOL", "HDL", "LDLCALC", "TRIG", "CHOLHDL", "LDLDIRECT" in the last 72 hours. Thyroid Function Tests: Recent Labs    01/08/24 1700  TSH 1.038  FREET4 0.87   Anemia Panel: No results for input(s): "VITAMINB12", "FOLATE", "FERRITIN", "TIBC", "IRON", "RETICCTPCT" in the last 72 hours. Urine analysis:    Component Value Date/Time   COLORURINE YELLOW (A) 01/08/2024 1911   APPEARANCEUR CLOUDY (A) 01/08/2024 1911   APPEARANCEUR Clear 06/22/2022 1315   LABSPEC 1.024 01/08/2024 1911   PHURINE 7.0 01/08/2024 1911   GLUCOSEU NEGATIVE 01/08/2024 1911   HGBUR NEGATIVE 01/08/2024 1911   BILIRUBINUR NEGATIVE 01/08/2024 1911   BILIRUBINUR Negative 06/22/2022 1315   KETONESUR 20 (A) 01/08/2024 1911   PROTEINUR NEGATIVE 01/08/2024 1911   NITRITE NEGATIVE 01/08/2024 1911   LEUKOCYTESUR TRACE (A) 01/08/2024 1911   Sepsis Labs: @LABRCNTIP (procalcitonin:4,lacticidven:4) ) Recent Results (from the past 240 hours)  Resp panel by RT-PCR (RSV, Flu A&B, Covid) Anterior Nasal Swab     Status: None   Collection Time: 01/08/24  4:50 PM   Specimen: Anterior Nasal Swab  Result Value Ref Range Status   SARS Coronavirus 2 by RT PCR NEGATIVE NEGATIVE Final    Comment: (NOTE) SARS-CoV-2 target nucleic acids are NOT DETECTED.  The SARS-CoV-2 RNA  is generally detectable in upper respiratory specimens during the acute phase of  infection. The lowest concentration of SARS-CoV-2 viral copies this assay can detect is 138 copies/mL. A negative result does not preclude SARS-Cov-2 infection and should not be used as the sole basis for treatment or other patient management decisions. A negative result may occur with  improper specimen collection/handling, submission of specimen other than nasopharyngeal swab, presence of viral mutation(s) within the areas targeted by this assay, and inadequate number of viral copies(<138 copies/mL). A negative result must be combined with clinical observations, patient history, and epidemiological information. The expected result is Negative.  Fact Sheet for Patients:  BloggerCourse.com  Fact Sheet for Healthcare Providers:  SeriousBroker.it  This test is no t yet approved or cleared by the [place] FDA and  has been authorized for detection and/or diagnosis of SARS-CoV-2 by FDA under an Emergency Use Authorization (EUA). This EUA will remain  in effect (meaning this test can be used) for the duration of the COVID-19 declaration under Section 564(b)(1) of the Act, 21 U.S.C.section 360bbb-3(b)(1), unless the authorization is terminated  or revoked sooner.       Influenza A by PCR NEGATIVE NEGATIVE Final   Influenza B by PCR NEGATIVE NEGATIVE Final    Comment: (NOTE) The Xpert Xpress SARS-CoV-2/FLU/RSV plus assay is intended as an aid in the diagnosis of influenza from Nasopharyngeal swab specimens and should not be used as a sole basis for treatment. Nasal washings and aspirates are unacceptable for Xpert Xpress SARS-CoV-2/FLU/RSV testing.  Fact Sheet for Patients: BloggerCourse.com  Fact Sheet for Healthcare Providers: SeriousBroker.it  This test is not yet approved or cleared by the [place] FDA and has been authorized for detection and/or diagnosis of SARS-CoV-2  by FDA under an Emergency Use Authorization (EUA). This EUA will remain in effect (meaning this test can be used) for the duration of the COVID-19 declaration under Section 564(b)(1) of the Act, 21 U.S.C. section 360bbb-3(b)(1), unless the authorization is terminated or revoked.     Resp Syncytial Virus by PCR NEGATIVE NEGATIVE Final    Comment: (NOTE) Fact Sheet for Patients: BloggerCourse.com  Fact Sheet for Healthcare Providers: SeriousBroker.it  This test is not yet approved or cleared by the [place] FDA and has been authorized for detection and/or diagnosis of SARS-CoV-2 by FDA under an Emergency Use Authorization (EUA). This EUA will remain in effect (meaning this test can be used) for the duration of the COVID-19 declaration under Section 564(b)(1) of the Act, 21 U.S.C. section 360bbb-3(b)(1), unless the authorization is terminated or revoked.  Performed at [facility], [address], [place], [place] [address]      Radiological Exams on Admission:   Assessment/Plan Principal Problem:   Nausea & vomiting Active Problems:   Sinus bradycardia   Hypophosphatemia   Chronic pain syndrome   Gastroesophageal reflux disease   History of PCOS   Depression with anxiety   Obesity (BMI 30-39.9)   Assessment and Plan:   Nausea & vomiting: Patient has had multiple episodes of nonbilious nonbloody vomiting.  No diarrhea or abdominal pain.  No fever or chills.  Associated with dizziness and feeling of room spinning.  MRI of the brain is negative.  Potential differential diagnosis include food poisoning and peripheral vertigo.  -Placed on telemetry bed for observation -Check orthostatic vital signs -IV fluid: 2 L normal saline, then 75 cc/h -As needed Zofran for nausea and vomiting -PT/OT -Fall precaution  Sinus bradycardia: Heart rate 39-53, no  chest pain or SOB.  TSH and free T4 normal. -Telemetry  monitoring  Hypophosphatemia: Phosphorus 1.3 -Give 30 mEq of potassium phosphate  Addendum: the repeated phosphorus level is normal 3.5 -Will D/C potassium phosphate.  Chronic pain syndrome -Continue Lyrica, Flexeril  Gastroesophageal reflux disease -Tonics  History of PCOS and premature ovarian failure -Continue metformin and progesterone  Depression with anxiety -Continue home medications  Obesity (BMI 30-39.9): Body weight 106 kg, BMI 32.59 -Encourage losing weight -Exercise and healthy diet      DVT ppx: SQ Lovenox  Code Status: Full code     Family Communication:     not done, no family member is at bed side.     Disposition Plan:  Anticipate discharge back to previous environment  Consults called:  none  Admission status and Level of care: Telemetry Medical:    for obs     Dispo: The patient is from: Home              Anticipated d/c is to: Home              Anticipated d/c date is: 1 day              Patient currently is not medically stable to d/c.    Severity of Illness:  The appropriate patient status for this patient is OBSERVATION. Observation status is judged to be reasonable and necessary in order to provide the required intensity of service to ensure the patient's safety. The patient's presenting symptoms, physical exam findings, and initial radiographic and laboratory data in the context of their medical condition is felt to place them at decreased risk for further clinical deterioration. Furthermore, it is anticipated that the patient will be medically stable for discharge from the hospital within 2 midnights of admission.        Date of Service 01/09/2024    Lorretta Harp [facility]   If 7PM-7AM, please contact night-coverage [email or web address] 01/09/2024, 1:26 AM

## 2024-01-08 NOTE — Progress Notes (Signed)
 PHARMACIST - PHYSICIAN COMMUNICATION  CONCERNING:  Enoxaparin (Lovenox) for DVT Prophylaxis    RECOMMENDATION: Patient was prescribed enoxaprin 40mg  q24 hours for VTE prophylaxis.   Filed Weights   01/08/24 1631  Weight: 106 kg (233 lb 11 oz)    Body mass index is 32.59 kg/m.  Estimated Creatinine Clearance: 122.2 mL/min (by C-G formula based on SCr of 0.82 mg/dL).   Based on [facility] policy patient is candidate for enoxaparin 0.5mg /kg TBW SQ every 24 hours based on BMI being >30.  DESCRIPTION: Pharmacy has adjusted enoxaparin dose per [facility] policy.  Patient is now receiving enoxaparin 0.5 mg/kg every 24 hours   Otelia Sergeant, PharmD, [facility] 01/08/2024 10:59 PM

## 2024-01-08 NOTE — ED Notes (Signed)
 This RN called lab due to 3 blood draw attempts with no success. This RN and tech placed pt in bed.

## 2024-01-08 NOTE — ED Notes (Signed)
 Lab called to come draw remaining troponin lab due

## 2024-01-08 NOTE — ED Notes (Signed)
 Unable to get an oral or axillary temp. Pt is refusing a recal temp in triage. Pt complain of being cold. Pt has HX of Ehlers-Danlos Syndrome

## 2024-01-09 DIAGNOSIS — R112 Nausea with vomiting, unspecified: Secondary | ICD-10-CM | POA: Diagnosis not present

## 2024-01-09 DIAGNOSIS — Z8742 Personal history of other diseases of the female genital tract: Secondary | ICD-10-CM

## 2024-01-09 DIAGNOSIS — R42 Dizziness and giddiness: Secondary | ICD-10-CM | POA: Diagnosis not present

## 2024-01-09 LAB — CBC
HCT: 34.9 % — ABNORMAL LOW (ref 36.0–46.0)
Hemoglobin: 11.9 g/dL — ABNORMAL LOW (ref 12.0–15.0)
MCH: 32.1 pg (ref 26.0–34.0)
MCHC: 34.1 g/dL (ref 30.0–36.0)
MCV: 94.1 fL (ref 80.0–100.0)
Platelets: 267 10*3/uL (ref 150–400)
RBC: 3.71 MIL/uL — ABNORMAL LOW (ref 3.87–5.11)
RDW: 12.9 % (ref 11.5–15.5)
WBC: 8.3 10*3/uL (ref 4.0–10.5)
nRBC: 0 % (ref 0.0–0.2)

## 2024-01-09 LAB — HIV ANTIBODY (ROUTINE TESTING W REFLEX): HIV Screen 4th Generation wRfx: NONREACTIVE

## 2024-01-09 LAB — BASIC METABOLIC PANEL
Anion gap: 10 (ref 5–15)
BUN: 10 mg/dL (ref 6–20)
CO2: 21 mmol/L — ABNORMAL LOW (ref 22–32)
Calcium: 8.6 mg/dL — ABNORMAL LOW (ref 8.9–10.3)
Chloride: 111 mmol/L (ref 98–111)
Creatinine, Ser: 0.61 mg/dL (ref 0.44–1.00)
GFR, Estimated: >60 mL/min (ref >60)
Glucose, Bld: 98 mg/dL (ref 70–99)
Potassium: 3.6 mmol/L (ref 3.5–5.1)
Sodium: 142 mmol/L (ref 135–145)

## 2024-01-09 MED ORDER — PANTOPRAZOLE SODIUM 40 MG PO TBEC
40.0000 mg | DELAYED_RELEASE_TABLET | Freq: Every day | ORAL | Status: DC
  Administered 2024-01-09: 40 mg via ORAL
  Filled 2024-01-09: qty 1

## 2024-01-09 MED ORDER — MIDODRINE HCL 5 MG PO TABS
5.0000 mg | ORAL_TABLET | Freq: Three times a day (TID) | ORAL | Status: DC
  Administered 2024-01-09: 5 mg via ORAL
  Filled 2024-01-09: qty 1

## 2024-01-09 MED ORDER — METFORMIN HCL ER 750 MG PO TB24
2000.0000 mg | ORAL_TABLET | Freq: Every day | ORAL | Status: DC
  Filled 2024-01-09: qty 1

## 2024-01-09 MED ORDER — INFLUENZA VIRUS VACC SPLIT PF (FLUZONE) 0.5 ML IM SUSY: 0.5000 mL | PREFILLED_SYRINGE | INTRAMUSCULAR | Status: DC

## 2024-01-09 MED ORDER — CYCLOBENZAPRINE HCL 10 MG PO TABS: 10.0000 mg | ORAL_TABLET | Freq: Three times a day (TID) | ORAL | Status: DC | PRN

## 2024-01-09 MED ORDER — HYDROXYZINE HCL 10 MG PO TABS: 5.0000 mg | ORAL_TABLET | Freq: Every day | ORAL | Status: DC | PRN

## 2024-01-09 MED ORDER — MEDROXYPROGESTERONE ACETATE 10 MG PO TABS: 10.0000 mg | ORAL_TABLET | Freq: Every day | ORAL | Status: DC

## 2024-01-09 MED ORDER — PREGABALIN 50 MG PO CAPS: 50.0000 mg | ORAL_CAPSULE | Freq: Every day | ORAL | Status: DC

## 2024-01-09 MED ORDER — LORATADINE 10 MG PO TABS
10.0000 mg | ORAL_TABLET | ORAL | Status: DC
  Administered 2024-01-09: 10 mg via ORAL
  Filled 2024-01-09: qty 1

## 2024-01-09 MED ORDER — ONDANSETRON 4 MG PO TBDP: 4.0000 mg | ORAL_TABLET | Freq: Three times a day (TID) | ORAL | 0 refills | Status: AC | PRN

## 2024-01-09 MED ORDER — DULOXETINE HCL 30 MG PO CPEP
30.0000 mg | ORAL_CAPSULE | Freq: Every day | ORAL | Status: DC
  Administered 2024-01-09: 30 mg via ORAL
  Filled 2024-01-09: qty 1

## 2024-01-09 MED ORDER — MONTELUKAST SODIUM 10 MG PO TABS: 10.0000 mg | ORAL_TABLET | Freq: Every day | ORAL | Status: DC

## 2024-01-09 MED ORDER — MECLIZINE HCL 25 MG PO TABS: 25.0000 mg | ORAL_TABLET | Freq: Three times a day (TID) | ORAL | 0 refills | Status: AC | PRN

## 2024-01-09 MED ORDER — MECLIZINE HCL 25 MG PO TABS
25.0000 mg | ORAL_TABLET | Freq: Three times a day (TID) | ORAL | Status: DC | PRN
  Administered 2024-01-09: 25 mg via ORAL
  Filled 2024-01-09 (×2): qty 1

## 2024-01-09 MED ORDER — GUANFACINE HCL ER 1 MG PO TB24
2.0000 mg | ORAL_TABLET | Freq: Every day | ORAL | Status: DC
  Administered 2024-01-09: 2 mg via ORAL
  Filled 2024-01-09: qty 2

## 2024-01-09 NOTE — Discharge Summary (Signed)
 Physician Discharge Summary  Stacy West ZOX:[id number] DOB: 1981-07-05 DOA: 01/08/2024  PCP: Marjie Skiff, NP  Admit date: 01/08/2024 Discharge date: 01/09/2024  Admitted From: home  Disposition:  home   Recommendations for Outpatient Follow-up:  Follow up with PCP in 1-2 weeks F/u w/ ENT in 1 week F/u w/ vestibular PT ASAP  Home Health: no  Equipment/Devices:  Discharge Condition: stable  CODE STATUS: full  Diet recommendation: Regular   Brief/Interim Summary: HPI was taken from Dr. Clyde Lundborg: Stacy West is a 43 y.o. female with medical history significant of depression with anxiety, GERD, obesity, fibromyalgia, premature ovarian failure, PCOS, celiac disease, Ehlers-Danlos disease, OSA not on CPAP, who presents with nausea, vomiting, dizziness.   Patient states that she started having nausea, vomiting about 3:30 PM after eating fast food.  She has had 8 episode of nonbilious nonbloody vomiting.  No abdominal pain or diarrhea.  No fever or chills.  Patient has dizziness and feeling room spinning.  No hearing loss.  No chest pain, cough, SOB.  No symptoms of UTI. Denies abdominal pain chest pain shortness of breath or fever.  Feels very dizzy which is worse with movement and sitting up.   Data reviewed independently and ED Course: pt was found to have WBC 10.3, phosphorus 1.3, potassium 3.6, magnesium 2.1, negative PCR for COVID, flu and RSV, GFR> 60, TSH normal 1.038, free T40.87, troponin negative x 2, negative pregnancy test.  Temperature normal, blood pressure 97/63, heart rate 39/53, RR 18, oxygen saturation 100% on room air.  MRI of the brain is negative.  Patient is placed on telemetry bed for observation.  Discharge Diagnoses:  Principal Problem:   Nausea & vomiting Active Problems:   Sinus bradycardia   Hypophosphatemia   Chronic pain syndrome   Gastroesophageal reflux disease   History of PCOS   Depression with anxiety   Obesity (BMI 30-39.9)  Nausea, vomiting &  dizziness: etiology unclear, ddx food poisoning vs peripheral vertigo vs marijuana use. Zofran prn for nausea, vomiting. Continue on IVFs. Meclizine prn. Pt denies ever having any nausea, vomiting or dizziness to eating marijuana before and has been using it for many years for pain control. Urine drug screen was positive for marijuana    Chronic low BP: continue on IVFs, compression stockings and started midodrine. Orthostatic vitals are neg     Hx of Ehlers-Danlos: continue w/ supportive care    Sinus bradycardia: Heart rate 39-53, no chest pain or SOB.  TSH is WNL. Not on any BB or CCB.   Chronic pain syndrome: continue on home dose of flexeril, pregabalin    GERD: continue on PPI    History of PCOS and premature ovarian failure: continue on home dose of provera    Depression: severity unknown. Continue on home dose of duloxetine   Obesity: BMI 32.5. Would benefit from weight loss   Discharge Instructions  Discharge Instructions     Diet general   Complete by: As directed    Discharge instructions   Complete by: As directed    F/u w/ PCP in 1-2 weeks. F/u w/ vestibular PT outpatient as soon as possible. F/u w/ ENT w/in 1 week, you may need a referral from your PCP to see ENT   Increase activity slowly   Complete by: As directed       Allergies as of 01/09/2024       Reactions   Carisoprodol-aspirin-codeine Hives, Itching   Carisoprodol Dermatitis   Percocet  [oxycodone-acetaminophen] Dermatitis  Gluten Meal Hives, Nausea Only   Tramadol         Medication List     STOP taking these medications    famotidine 20 MG tablet Commonly known as: PEPCID   FLUoxetine 10 MG capsule Commonly known as: PROZAC   hydrOXYzine 10 MG tablet Commonly known as: ATARAX       TAKE these medications    albuterol 108 (90 Base) MCG/ACT inhaler Commonly known as: VENTOLIN HFA Inhale 1-2 puffs into the lungs every 4 (four) hours as needed for wheezing or shortness of breath  (cough).   B-D 3CC LUER-LOK SYR 20GX1" 20G X 1" 3 ML Misc Generic drug: SYRINGE-NEEDLE (DISP) 3 ML Use to inject B12 monthly intramuscular.   cetirizine 10 MG tablet Commonly known as: ZYRTEC TAKE 1 TABLET BY MOUTH EVERY DAY What changed: when to take this   Cholecalciferol 1.25 MG (50000 UT) Tabs Take 1 tablet by mouth once a week.   cyanocobalamin 1000 MCG/ML injection Commonly known as: VITAMIN B12 Inject 1 mL (1,000 mcg total) into the muscle every 30 (thirty) days.   cyclobenzaprine 10 MG tablet Commonly known as: FLEXERIL Take 1 tablet (10 mg total) by mouth 3 (three) times daily as needed for muscle spasms.   DULoxetine 30 MG capsule Commonly known as: CYMBALTA Take 30 mg by mouth daily.   guanFACINE 2 MG Tb24 ER tablet Commonly known as: INTUNIV Take 2 mg by mouth daily.   levocetirizine 5 MG tablet Commonly known as: XYZAL TAKE 1 TABLET BY MOUTH EVERY DAY IN THE EVENING What changed:  how much to take how to take this when to take this   meclizine 25 MG tablet Commonly known as: ANTIVERT Take 1 tablet (25 mg total) by mouth 3 (three) times daily as needed for up to 14 days for dizziness.   medroxyPROGESTERone 10 MG tablet Commonly known as: PROVERA Take 10 mg by mouth daily. Per patient takes for 10 days then off for 2 weeks then repeats cycle   metFORMIN 500 MG 24 hr tablet Commonly known as: GLUCOPHAGE-XR Take 4 tablets by mouth at bedtime.   METHYL-FOLATE PO Take by mouth.   montelukast 10 MG tablet Commonly known as: SINGULAIR TAKE 1 TABLET BY MOUTH EVERYDAY AT BEDTIME   NALTREXONE HCL PO Take 1.5 mg by mouth daily.   Omega-3 1000 MG Caps Take 1 capsule by mouth every morning.   ondansetron 4 MG disintegrating tablet Commonly known as: ZOFRAN-ODT Take 1 tablet (4 mg total) by mouth every 8 (eight) hours as needed for up to 14 days for nausea or vomiting.   pantoprazole 40 MG tablet Commonly known as: PROTONIX TAKE 1 TABLET BY MOUTH  EVERY DAY   pregabalin 50 MG capsule Commonly known as: LYRICA Take 1 capsule (50 mg total) by mouth at bedtime.   trazodone 300 MG tablet Commonly known as: DESYREL Take 50 mg by mouth at bedtime.        Allergies  Allergen Reactions   Carisoprodol-Aspirin-Codeine Hives and Itching   Carisoprodol Dermatitis   Percocet  [Oxycodone-Acetaminophen] Dermatitis   Gluten Meal Hives and Nausea Only   Tramadol     Consultations:    Procedures/Studies: MR BRAIN WO CONTRAST Result Date: 01/08/2024 CLINICAL DATA:  Altered mental status EXAM: MRI HEAD WITHOUT CONTRAST TECHNIQUE: Multiplanar, multiecho pulse sequences of the brain and surrounding structures were obtained without intravenous contrast. COMPARISON:  None Available. FINDINGS: Brain: No acute infarct, mass effect or extra-axial collection. No acute or  chronic hemorrhage. Normal white matter signal, parenchymal volume and CSF spaces. The midline structures are normal. Vascular: Normal flow voids. Skull and upper cervical spine: Normal calvarium and skull base. Visualized upper cervical spine and soft tissues are normal. Sinuses/Orbits:No paranasal sinus fluid levels or advanced mucosal thickening. No mastoid or middle ear effusion. Normal orbits. IMPRESSION: Normal brain MRI. Electronically Signed   By: Deatra Robinson M.D.   On: 01/08/2024 21:24   (Echo, Carotid, EGD, Colonoscopy, ERCP)    Subjective: Pt c/o intermittent nausea & dizziness    Discharge Exam: Vitals:   01/09/24 1001 01/09/24 1003  BP: 107/64 93/70  Pulse: (!) 53 62  Resp:    Temp:    SpO2:     Vitals:   01/09/24 0837 01/09/24 0959 01/09/24 1001 01/09/24 1003  BP: (!) 90/43 (!) 89/53 107/64 93/70  Pulse:  (!) 55 (!) 53 62  Resp:      Temp: 98.1 F (36.7 C)     TempSrc: Oral     SpO2:      Weight:      Height:        General: Pt is alert, awake, not in acute distress Cardiovascular: S1/S2 +, no rubs, no gallops Respiratory: CTA bilaterally, no  wheezing, no rhonchi Abdominal: Soft, NT, obese, bowel sounds + Extremities: no edema, no cyanosis    The results of significant diagnostics from this hospitalization (including imaging, microbiology, ancillary and laboratory) are listed below for reference.     Microbiology: Recent Results (from the past 240 hours)  Resp panel by RT-PCR (RSV, Flu A&B, Covid) Anterior Nasal Swab     Status: None   Collection Time: 01/08/24  4:50 PM   Specimen: Anterior Nasal Swab  Result Value Ref Range Status   SARS Coronavirus 2 by RT PCR NEGATIVE NEGATIVE Final    Comment: (NOTE) SARS-CoV-2 target nucleic acids are NOT DETECTED.  The SARS-CoV-2 RNA is generally detectable in upper respiratory specimens during the acute phase of infection. The lowest concentration of SARS-CoV-2 viral copies this assay can detect is 138 copies/mL. A negative result does not preclude SARS-Cov-2 infection and should not be used as the sole basis for treatment or other patient management decisions. A negative result may occur with  improper specimen collection/handling, submission of specimen other than nasopharyngeal swab, presence of viral mutation(s) within the areas targeted by this assay, and inadequate number of viral copies(<138 copies/mL). A negative result must be combined with clinical observations, patient history, and epidemiological information. The expected result is Negative.  Fact Sheet for Patients:  BloggerCourse.com  Fact Sheet for Healthcare Providers:  SeriousBroker.it  This test is no t yet approved or cleared by the [place] FDA and  has been authorized for detection and/or diagnosis of SARS-CoV-2 by FDA under an Emergency Use Authorization (EUA). This EUA will remain  in effect (meaning this test can be used) for the duration of the COVID-19 declaration under Section 564(b)(1) of the Act, 21 U.S.C.section 360bbb-3(b)(1), unless the  authorization is terminated  or revoked sooner.       Influenza A by PCR NEGATIVE NEGATIVE Final   Influenza B by PCR NEGATIVE NEGATIVE Final    Comment: (NOTE) The Xpert Xpress SARS-CoV-2/FLU/RSV plus assay is intended as an aid in the diagnosis of influenza from Nasopharyngeal swab specimens and should not be used as a sole basis for treatment. Nasal washings and aspirates are unacceptable for Xpert Xpress SARS-CoV-2/FLU/RSV testing.  Fact Sheet for Patients: BloggerCourse.com  Fact  Sheet for Healthcare Providers: SeriousBroker.it  This test is not yet approved or cleared by the [place] and has been authorized for detection and/or diagnosis of SARS-CoV-2 by FDA under an Emergency Use Authorization (EUA). This EUA will remain in effect (meaning this test can be used) for the duration of the COVID-19 declaration under Section 564(b)(1) of the Act, 21 U.S.C. section 360bbb-3(b)(1), unless the authorization is terminated or revoked.     Resp Syncytial Virus by PCR NEGATIVE NEGATIVE Final    Comment: (NOTE) Fact Sheet for Patients: BloggerCourse.com  Fact Sheet for Healthcare Providers: SeriousBroker.it  This test is not yet approved or cleared by the [place] FDA and has been authorized for detection and/or diagnosis of SARS-CoV-2 by FDA under an Emergency Use Authorization (EUA). This EUA will remain in effect (meaning this test can be used) for the duration of the COVID-19 declaration under Section 564(b)(1) of the Act, 21 U.S.C. section 360bbb-3(b)(1), unless the authorization is terminated or revoked.  Performed at [facility], [address] [place], [place] [address]      Labs: BNP (last 3 results) No results for input(s): "BNP" in the last 8760 hours. Basic Metabolic Panel: Recent Labs  Lab 01/08/24 1700 01/08/24 2317 01/09/24 0515   NA 139  --  142  K 3.6  --  3.6  CL 105  --  111  CO2 22  --  21*  GLUCOSE 107*  --  98  BUN 14  --  10  CREATININE 0.82  --  0.61  CALCIUM 9.5  --  8.6*  MG 2.1  --   --   PHOS 1.3* 3.5  --    Liver Function Tests: Recent Labs  Lab 01/08/24 1700  AST 19  ALT 20  ALKPHOS 46  BILITOT 0.7  PROT 6.4*  ALBUMIN 4.1   Recent Labs  Lab 01/08/24 1700  LIPASE 35   No results for input(s): "AMMONIA" in the last 168 hours. CBC: Recent Labs  Lab 01/08/24 1700 01/09/24 0515  WBC 10.3 8.3  NEUTROABS 7.6  --   HGB 13.5 11.9*  HCT 38.8 34.9*  MCV 91.3 94.1  PLT 319 267   Cardiac Enzymes: No results for input(s): "CKTOTAL", "CKMB", "CKMBINDEX", "TROPONINI" in the last 168 hours. BNP: Invalid input(s): "POCBNP" CBG: No results for input(s): "GLUCAP" in the last 168 hours. D-Dimer No results for input(s): "DDIMER" in the last 72 hours. Hgb A1c No results for input(s): "HGBA1C" in the last 72 hours. Lipid Profile No results for input(s): "CHOL", "HDL", "LDLCALC", "TRIG", "CHOLHDL", "LDLDIRECT" in the last 72 hours. Thyroid function studies Recent Labs    01/08/24 1700  TSH 1.038   Anemia work up No results for input(s): "VITAMINB12", "FOLATE", "FERRITIN", "TIBC", "IRON", "RETICCTPCT" in the last 72 hours. Urinalysis    Component Value Date/Time   COLORURINE YELLOW (A) 01/08/2024 1911   APPEARANCEUR CLOUDY (A) 01/08/2024 1911   APPEARANCEUR Clear 06/22/2022 1315   LABSPEC 1.024 01/08/2024 1911   PHURINE 7.0 01/08/2024 1911   GLUCOSEU NEGATIVE 01/08/2024 1911   HGBUR NEGATIVE 01/08/2024 1911   BILIRUBINUR NEGATIVE 01/08/2024 1911   BILIRUBINUR Negative 06/22/2022 1315   KETONESUR 20 (A) 01/08/2024 1911   PROTEINUR NEGATIVE 01/08/2024 1911   NITRITE NEGATIVE 01/08/2024 1911   LEUKOCYTESUR TRACE (A) 01/08/2024 1911   Sepsis Labs Recent Labs  Lab 01/08/24 1700 01/09/24 0515  WBC 10.3 8.3   Microbiology Recent Results (from the past 240 hours)  Resp panel  by RT-PCR (RSV, Flu A&B, Covid) Anterior Nasal Swab     Status: None   Collection Time: 01/08/24  4:50 PM   Specimen: Anterior Nasal Swab  Result Value Ref Range Status   SARS Coronavirus 2 by RT PCR NEGATIVE NEGATIVE Final    Comment: (NOTE) SARS-CoV-2 target nucleic acids are NOT DETECTED.  The SARS-CoV-2 RNA is generally detectable in upper respiratory specimens during the acute phase of infection. The lowest concentration of SARS-CoV-2 viral copies this assay can detect is 138 copies/mL. A negative result does not preclude SARS-Cov-2 infection and should not be used as the sole basis for treatment or other patient management decisions. A negative result may occur with  improper specimen collection/handling, submission of specimen other than nasopharyngeal swab, presence of viral mutation(s) within the areas targeted by this assay, and inadequate number of viral copies(<138 copies/mL). A negative result must be combined with clinical observations, patient history, and epidemiological information. The expected result is Negative.  Fact Sheet for Patients:  BloggerCourse.com  Fact Sheet for Healthcare Providers:  SeriousBroker.it  This test is no t yet approved or cleared by the [place] FDA and  has been authorized for detection and/or diagnosis of SARS-CoV-2 by FDA under an Emergency Use Authorization (EUA). This EUA will remain  in effect (meaning this test can be used) for the duration of the COVID-19 declaration under Section 564(b)(1) of the Act, 21 U.S.C.section 360bbb-3(b)(1), unless the authorization is terminated  or revoked sooner.       Influenza A by PCR NEGATIVE NEGATIVE Final   Influenza B by PCR NEGATIVE NEGATIVE Final    Comment: (NOTE) The Xpert Xpress SARS-CoV-2/FLU/RSV plus assay is intended as an aid in the diagnosis of influenza from Nasopharyngeal swab specimens and should not be used as a sole  basis for treatment. Nasal washings and aspirates are unacceptable for Xpert Xpress SARS-CoV-2/FLU/RSV testing.  Fact Sheet for Patients: BloggerCourse.com  Fact Sheet for Healthcare Providers: SeriousBroker.it  This test is not yet approved or cleared by the [place] FDA and has been authorized for detection and/or diagnosis of SARS-CoV-2 by FDA under an Emergency Use Authorization (EUA). This EUA will remain in effect (meaning this test can be used) for the duration of the COVID-19 declaration under Section 564(b)(1) of the Act, 21 U.S.C. section 360bbb-3(b)(1), unless the authorization is terminated or revoked.     Resp Syncytial Virus by PCR NEGATIVE NEGATIVE Final    Comment: (NOTE) Fact Sheet for Patients: BloggerCourse.com  Fact Sheet for Healthcare Providers: SeriousBroker.it  This test is not yet approved or cleared by the [place] FDA and has been authorized for detection and/or diagnosis of SARS-CoV-2 by FDA under an Emergency Use Authorization (EUA). This EUA will remain in effect (meaning this test can be used) for the duration of the COVID-19 declaration under Section 564(b)(1) of the Act, 21 U.S.C. section 360bbb-3(b)(1), unless the authorization is terminated or revoked.  Performed at [facility], [address] [place], [place] [address]      Time coordinating discharge: Over 30 minutes  SIGNED:   Charise Killian, MD  [facility] 01/09/2024, 3:05 PM Pager   If 7PM-7AM, please contact night-coverage

## 2024-01-09 NOTE — Progress Notes (Signed)
 Discharge instructions reviewed with the patient. IV removed. Patient sent out via wheelchair with belongings to waiting ride

## 2024-01-09 NOTE — Evaluation (Signed)
 Physical Therapy Evaluation Patient Details Name: Stacy West MRN: [id number] DOB: 10/18/1981 Today's Date: 01/09/2024  History of Present Illness  Pt is a 43 y.o. female with medical history significant of depression with anxiety, GERD, obesity, fibromyalgia, premature ovarian failure, PCOS, celiac disease, Ehlers-Danlos disease, OSA not on CPAP, who presents with nausea, vomiting, dizziness.   Clinical Impression  Patient alert, agreeable to PT, seen with OT to maximize safety and mobility. At baseline pt is independent but does have her husband assists with ADLs/IADLs as needed on "bad days" due to pain.   The session focused on assessing for BPPV, pt able to mobilize in bed without assistance but CGA-minA for testing positions. Utilized bed features to  ~12 degrees due to history of cervical fusions. R and L Dix Hallpike performed with pt more symptomatic on R side, no nystagmus noted but pt did have difficulty keeping her eyes open. Epley x2 on R side, pt reported improvement in symptoms with second round compared to first. MD present, pt educated on potential need for ENT follow up and outpt vestibular therapy for more assessment.   Screening questions:   Subjective history of current problem: Pt did describe recent antibiotic round for potential ear infection, complained of stuffiness, pain and fullness of both ears, L>R but no discharge. Sudden onset of dizziness (did do a lot of bending earlier in the AM) lasted for hours, rotational, 10/10 dizziness.   exacerbating factors: moving, sitting up, does feel some dizziness at rest  Relieving factors: eyes closed, not moving  Symptom duration: ongoing, worsened with movement   Description of symptoms, description of dizziness: initially rotational and severe, improved to more sway-like. Pt did have medication prior to PT arrival.   Symptom nature (motion provoked, position dependent, spontaneous), improvement since onset:     OCULOMOTOR / VESTIBULAR TESTING:  Oculomotor Exam- Room Light  Findings Comments  Ocular Alignment normal   Ocular ROM normal   Spontaneous Nystagmus normal   Gaze-Holding Nystagmus normal   End-Gaze Nystagmus normal   Vergence (normal 2-3") normal   Smooth Pursuit normal   Saccades not examined     VOR Cancellation not examined   Left Head Impulse Not tested   Right Head Impulse not examined   Cross-Cover Test normal      BPPV TESTS:  Symptoms Duration Intensity Nystagmus  Left Dix-Hallpike Very very minor symptoms, nausea Less than 20seconds  none  Right Dix-Hallpike Dizziness and nausea ~1-2 minutes of worsening symptoms significant Pt with difficulty keeping eyes open, but nystagmus not noted                        If plan is discharge home, recommend the following: A little help with walking and/or transfers;A little help with bathing/dressing/bathroom;Assistance with cooking/housework;Assist for transportation;Help with stairs or ramp for entrance   Can travel by private vehicle        Equipment Recommendations Other (comment) (TBD)  Recommendations for Other Services       Functional Status Assessment Patient has had a recent decline in their functional status and demonstrates the ability to make significant improvements in function in a reasonable and predictable amount of time.     Precautions / Restrictions Precautions Precautions: Fall Recall of Precautions/Restrictions: Intact Restrictions Weight Bearing Restrictions Per Provider Order: No      Mobility  Bed Mobility Overal bed mobility: Needs Assistance Bed Mobility: Rolling, Sidelying to Sit, Supine to Sit, Sit to Supine, Sit to Sidelying  Rolling: Contact guard assist, Min assist         General bed mobility comments: CGA-minA provided due to testing positions, but pt able to move without assistance    Transfers                   General transfer comment: deferred     Ambulation/Gait               General Gait Details: deferred  Stairs            Wheelchair Mobility     Tilt Bed    Modified Rankin (Stroke Patients Only)       Balance Overall balance assessment: Needs assistance Sitting-balance support: Feet supported Sitting balance-Leahy Scale: Good                                       Pertinent Vitals/Pain Pain Assessment Pain Assessment: Faces Faces Pain Scale: Hurts a little bit Pain Location: neck with testing positions Pain Descriptors / Indicators: Sore Pain Intervention(s): Limited activity within patient's tolerance, Monitored during session, Repositioned    Home Living Family/patient expects to be discharged to:: Private residence Living Arrangements: Spouse/significant other;Children (6yo kid) Available Help at Discharge: Family;Available PRN/intermittently;Available 24 hours/day Type of Home: House Home Access: Stairs to enter   [employer] of Steps: 1/2 step   Home Layout: Two level;Able to live on main level with bedroom/bathroom Home Equipment: Shower seat      Prior Function Prior Level of Function : Needs assist       Physical Assist : Mobility (physical);ADLs (physical)     Mobility Comments: generally independent, PRN assist for transfers when having a "bad day" ADLs Comments: assist for LB dressing 2/2 bad back, was able to do some pot planting seated, does not work     Extremity/Trunk Assessment   Upper Extremity Assessment Upper Extremity Assessment: Overall WFL for tasks assessed    Lower Extremity Assessment Lower Extremity Assessment: Overall WFL for tasks assessed    Cervical / Trunk Assessment Cervical / Trunk Assessment: Neck Surgery  Communication        Cognition Arousal: Alert Behavior During Therapy: WFL for tasks assessed/performed   PT - Cognitive impairments: No apparent impairments                                  Cueing       General Comments      Exercises     Assessment/Plan    PT Assessment Patient needs continued PT services  PT Problem List Decreased activity tolerance;Decreased balance;Decreased mobility       PT Treatment Interventions DME instruction;Balance training;Gait training;Neuromuscular re-education;Stair training;Functional mobility training;Patient/family education;Therapeutic activities;Therapeutic exercise    PT Goals (Current goals can be found in the Care Plan section)  Acute Rehab PT Goals Patient Stated Goal: to feel better PT Goal Formulation: With patient Time For Goal Achievement: 01/23/24 Potential to Achieve Goals: Good    Frequency Min 1X/week     Co-evaluation PT/OT/SLP Co-Evaluation/Treatment: Yes Reason for Co-Treatment: For patient/therapist safety;To address functional/ADL transfers PT goals addressed during session: Mobility/safety with mobility;Balance OT goals addressed during session: ADL's and self-care       AM-PAC PT "6 Clicks" Mobility  Outcome Measure Help needed turning from your back to your side while in a flat  bed without using bedrails?: None Help needed moving from lying on your back to sitting on the side of a flat bed without using bedrails?: None Help needed moving to and from a bed to a chair (including a wheelchair)?: None Help needed standing up from a chair using your arms (e.g., wheelchair or bedside chair)?: None Help needed to walk in hospital room?: A Little Help needed climbing 3-5 steps with a railing? : A Little 6 Click Score: 22    End of Session   Activity Tolerance: Patient tolerated treatment well Patient left: in bed;with call bell/phone within reach Nurse Communication: Mobility status PT Visit Diagnosis: Other abnormalities of gait and mobility (R26.89);Dizziness and giddiness (R42)    Time: 8841-6606 PT Time Calculation (min) (ACUTE ONLY): 40 min   Charges:   PT Evaluation $PT Eval Low  Complexity: 1 Low PT Treatments $Therapeutic Activity: 8-22 mins PT General Charges $$ ACUTE PT VISIT: 1 Visit         Olga Coaster PT, DPT 1:07 PM,01/09/24

## 2024-01-09 NOTE — Progress Notes (Addendum)
 PROGRESS NOTE    Stacy West  OZH:[id number] DOB: [birth year] DOA: 01/08/2024 PCP: Marjie Skiff, NP  Assessment & Plan:   Principal Problem:   Nausea & vomiting Active Problems:   Sinus bradycardia   Hypophosphatemia   Chronic pain syndrome   Gastroesophageal reflux disease   History of PCOS   Depression with anxiety   Obesity (BMI 30-39.9)  Assessment and Plan:  Nausea, vomiting & dizziness: etiology unclear, ddx food poisoning vs peripheral vertigo vs marijuana use. Zofran prn for nausea, vomiting. Continue on IVFs. Meclizine prn. Pt denies ever having any nausea, vomiting or dizziness to eating marijuana before and has been using it for many years for pain control   Chronic low BP: continue on IVFs, compression stockings and started midodrine. Orthostatic vitals are neg    Hx of Ehlers-Danlos: continue w/ supportive care    Sinus bradycardia: Heart rate 39-53, no chest pain or SOB.  TSH is WNL. Not on any BB or CCB.  Chronic pain syndrome: continue on home dose of flexeril, pregabalin    GERD: continue on PPI    History of PCOS and premature ovarian failure: continue on home dose of provera    Depression: severity unknown. Continue on home dose of duloxetine   Obesity: BMI 32.5. Would benefit from weight loss      DVT prophylaxis: lovenox Code Status: full  Family Communication: discussed pt's care w/ pt's husband at bedside and answered her questions  Disposition Plan: likely d/c back home   Level of care: Telemetry Medical  Status is: Observation The patient remains OBS appropriate and will d/c before 2 midnights.    Consultants:    Procedures:   Antimicrobials:   Subjective: Pt c/o nausea & dizziness  Objective: Vitals:   01/08/24 2344 01/09/24 0456 01/09/24 0520 01/09/24 0837  BP: 111/71 (!) 85/48 (!) 92/48 (!) 90/43  Pulse: (!) 55 (!) 59    Resp: 16 16    Temp: (!) 97.4 F (36.3 C) 98.3 F (36.8 C)  98.1 F (36.7 C)  TempSrc: Oral  Oral  Oral  SpO2: 100% 97%    Weight:      Height:        Intake/Output Summary (Last 24 hours) at 01/09/2024 0855 Last data filed at 01/09/2024 0455 Gross per 24 hour  Intake 252.12 ml  Output --  Net 252.12 ml   Filed Weights   01/08/24 1631  Weight: 106 kg    Examination:  General exam: Appears calm and comfortable  Respiratory system: Clear to auscultation. Respiratory effort normal. Cardiovascular system: S1 & S2+. No rubs, gallops or clicks.  Gastrointestinal system: Abdomen is obese, soft and nontender. Normal bowel sounds heard. Central nervous system: Alert and oriented. Moves all extremities  Psychiatry: Judgement and insight appear normal. Mood & affect appropriate.     Data Reviewed: I have personally reviewed following labs and imaging studies  CBC: Recent Labs  Lab 01/08/24 1700 01/09/24 0515  WBC 10.3 8.3  NEUTROABS 7.6  --   HGB 13.5 11.9*  HCT 38.8 34.9*  MCV 91.3 94.1  PLT 319 267   Basic Metabolic Panel: Recent Labs  Lab 01/08/24 1700 01/08/24 2317 01/09/24 0515  NA 139  --  142  K 3.6  --  3.6  CL 105  --  111  CO2 22  --  21*  GLUCOSE 107*  --  98  BUN 14  --  10  CREATININE 0.82  --  0.61  CALCIUM  9.5  --  8.6*  MG 2.1  --   --   PHOS 1.3* 3.5  --    GFR: Estimated Creatinine Clearance: 125.3 mL/min (by C-G formula based on SCr of 0.61 mg/dL). Liver Function Tests: Recent Labs  Lab 01/08/24 1700  AST 19  ALT 20  ALKPHOS 46  BILITOT 0.7  PROT 6.4*  ALBUMIN 4.1   Recent Labs  Lab 01/08/24 1700  LIPASE 35   No results for input(s): "AMMONIA" in the last 168 hours. Coagulation Profile: No results for input(s): "INR", "PROTIME" in the last 168 hours. Cardiac Enzymes: No results for input(s): "CKTOTAL", "CKMB", "CKMBINDEX", "TROPONINI" in the last 168 hours. BNP (last 3 results) No results for input(s): "PROBNP" in the last 8760 hours. HbA1C: No results for input(s): "HGBA1C" in the last 72 hours. CBG: No results  for input(s): "GLUCAP" in the last 168 hours. Lipid Profile: No results for input(s): "CHOL", "HDL", "LDLCALC", "TRIG", "CHOLHDL", "LDLDIRECT" in the last 72 hours. Thyroid Function Tests: Recent Labs    01/08/24 1700  TSH 1.038  FREET4 0.87   Anemia Panel: No results for input(s): "VITAMINB12", "FOLATE", "FERRITIN", "TIBC", "IRON", "RETICCTPCT" in the last 72 hours. Sepsis Labs: No results for input(s): "PROCALCITON", "LATICACIDVEN" in the last 168 hours.  Recent Results (from the past 240 hours)  Resp panel by RT-PCR (RSV, Flu A&B, Covid) Anterior Nasal Swab     Status: None   Collection Time: 01/08/24  4:50 PM   Specimen: Anterior Nasal Swab  Result Value Ref Range Status   SARS Coronavirus 2 by RT PCR NEGATIVE NEGATIVE Final    Comment: (NOTE) SARS-CoV-2 target nucleic acids are NOT DETECTED.  The SARS-CoV-2 RNA is generally detectable in upper respiratory specimens during the acute phase of infection. The lowest concentration of SARS-CoV-2 viral copies this assay can detect is 138 copies/mL. A negative result does not preclude SARS-Cov-2 infection and should not be used as the sole basis for treatment or other patient management decisions. A negative result may occur with  improper specimen collection/handling, submission of specimen other than nasopharyngeal swab, presence of viral mutation(s) within the areas targeted by this assay, and inadequate number of viral copies(<138 copies/mL). A negative result must be combined with clinical observations, patient history, and epidemiological information. The expected result is Negative.  Fact Sheet for Patients:  BloggerCourse.com  Fact Sheet for Healthcare Providers:  SeriousBroker.it  This test is no t yet approved or cleared by the [place] FDA and  has been authorized for detection and/or diagnosis of SARS-CoV-2 by FDA under an Emergency Use Authorization (EUA).  This EUA will remain  in effect (meaning this test can be used) for the duration of the COVID-19 declaration under Section 564(b)(1) of the Act, 21 U.S.C.section 360bbb-3(b)(1), unless the authorization is terminated  or revoked sooner.       Influenza A by PCR NEGATIVE NEGATIVE Final   Influenza B by PCR NEGATIVE NEGATIVE Final    Comment: (NOTE) The Xpert Xpress SARS-CoV-2/FLU/RSV plus assay is intended as an aid in the diagnosis of influenza from Nasopharyngeal swab specimens and should not be used as a sole basis for treatment. Nasal washings and aspirates are unacceptable for Xpert Xpress SARS-CoV-2/FLU/RSV testing.  Fact Sheet for Patients: BloggerCourse.com  Fact Sheet for Healthcare Providers: SeriousBroker.it  This test is not yet approved or cleared by the [place] FDA and has been authorized for detection and/or diagnosis of SARS-CoV-2 by FDA under an Emergency Use Authorization (EUA). This EUA  will remain in effect (meaning this test can be used) for the duration of the COVID-19 declaration under Section 564(b)(1) of the Act, 21 U.S.C. section 360bbb-3(b)(1), unless the authorization is terminated or revoked.     Resp Syncytial Virus by PCR NEGATIVE NEGATIVE Final    Comment: (NOTE) Fact Sheet for Patients: BloggerCourse.com  Fact Sheet for Healthcare Providers: SeriousBroker.it  This test is not yet approved or cleared by the [place] FDA and has been authorized for detection and/or diagnosis of SARS-CoV-2 by FDA under an Emergency Use Authorization (EUA). This EUA will remain in effect (meaning this test can be used) for the duration of the COVID-19 declaration under Section 564(b)(1) of the Act, 21 U.S.C. section 360bbb-3(b)(1), unless the authorization is terminated or revoked.  Performed at [facility], [address]  [place], [place] [address]          Radiology Studies: MR BRAIN WO CONTRAST Result Date: 01/08/2024 CLINICAL DATA:  Altered mental status EXAM: MRI HEAD WITHOUT CONTRAST TECHNIQUE: Multiplanar, multiecho pulse sequences of the brain and surrounding structures were obtained without intravenous contrast. COMPARISON:  None Available. FINDINGS: Brain: No acute infarct, mass effect or extra-axial collection. No acute or chronic hemorrhage. Normal white matter signal, parenchymal volume and CSF spaces. The midline structures are normal. Vascular: Normal flow voids. Skull and upper cervical spine: Normal calvarium and skull base. Visualized upper cervical spine and soft tissues are normal. Sinuses/Orbits:No paranasal sinus fluid levels or advanced mucosal thickening. No mastoid or middle ear effusion. Normal orbits. IMPRESSION: Normal brain MRI. Electronically Signed   By: Deatra Robinson M.D.   On: 01/08/2024 21:24        Scheduled Meds:  DULoxetine  30 mg Oral Daily   enoxaparin (LOVENOX) injection  55 mg Subcutaneous Q24H   guanFACINE  2 mg Oral Daily   [START ON 01/10/2024] influenza vac split trivalent PF  0.5 mL Intramuscular Tomorrow-1000   loratadine  10 mg Oral QODAY   metFORMIN  2,000 mg Oral QHS   montelukast  10 mg Oral QHS   pantoprazole  40 mg Oral Daily   pregabalin  50 mg Oral QHS   progesterone  100 mg Oral Daily   trazodone  300 mg Oral QHS   Continuous Infusions:  sodium chloride 100 mL/hr at 01/09/24 0533     LOS: 0 days       Charise Killian, MD [facility] Pager [phone]  If 7PM-7AM, please contact night-coverage [email or web address] 01/09/2024, 8:55 AM

## 2024-01-09 NOTE — Discharge Instructions (Addendum)
 Food Resources  Agency Name: [facility] Agency Address: [address] [place], [place] [address] Phone: [phone] Website: [email or web address] Service(s) Offered: Housing services, self-sufficiency, congregate meal program, weatherization program, Event organiser program, emergency food assistance,  housing counseling, home ownership program, wheels - to work program.  [employer] free for 60 and older at various locations from [employer], Monday-Friday:  [employer], [address] [place], [phone] -[facility], [address], [place] [phone]  -[facility], [address], [place] [phone]  -[address], [address] [place], [phone]  Agency Name: [facility] Address: [phone] [address], [place], [place] [address] Phone: [phone] Website: [email or web address] Service(s) Offered: Home delivered hot, frozen, and emergency  meals. Grocery assistance program which matches  volunteers one-on-one with seniors unable to grocery shop  for themselves. Must be 60 years and older; less than 20  hours of in-home aide service, limited or no driving ability;  live alone or with someone with a disability; live in  [place].  Agency Name: [facility]) Address: [address] [place], [place] [address] Phone: [phone] Service(s) Offered: Food is served to shut-ins, homeless, elderly, and low income people in the community every Saturday (11:30 am-12:30 pm) and Sunday (12:30 pm-1:30pm). Volunteers also offer help and encouragement in seeking employment,  and spiritual guidance.  Agency Name: [facility] Address: [address] [place], [place] [address] Phone: [phone] Service(s) Offered: Child support services; child welfare services; food stamps; Medicaid; work first family assistance; and aid  with fuel,  rent, food and medicine.  Agency Name: Dietitian Address: [address], [place], [place] Phone: [phone] Website: [email or web address] Services Offered: Monday 10:00am-12:00, 8:00pm-9:00pm, and Friday 10:00am-12:00.  Agency Name: [facility] [employer] [facility] [place] Address: [address] [place], [place] [address] Phone: [phone] Website: [email or web address] Service(s) Offered: Serves weekday meals, open from 11:30 am- 1:00 pm., and 6:30-7:30pm, Monday-Wednesday-Friday distributes food 3:30-6pm, Monday-Wednesday-Friday.  Agency Name: [facility] Address: [address] [place], [place] Phone: [phone] Website: [email or web address] Services Offered: Distributes food the 4th Saturday of the month, starting at 8:00 am  Agency Name: [facility] Address: [phone] [address] [place], [place] [address] Phone: [phone] Website: [email or web address] Service(s) Offered: Bread of life, weekly food pantry. Open Wednesdays from 10:00am-noon.  Agency Name: [facility] [employer] Address: [address] [place], [place], [place] Phone: [phone] Services Offered: Distributes food 9am-1pm, Monday-Thursday. Call for details.  Agency Name: [facility] Address: [address], [place], [place] [address] Phone: [phone] Website: [email or web address] Service(s) Offered: Games developer. Call for assistance.  Agency Name: [facility] Address: [address] [place], [place] [address] Phone: [phone] Service Offered: Emergency Food Pantry. Call for appointment.  Agency Name: [facility] Address: [address], [place], [place] [address] Phone: [phone] Website: [email or web address] Services Offered: Games developer. Call for details  Agency Name: [facility] [employer] [facility] [facility] Address: [address] [place], [place] Phone:  [phone] Website: [email or web address] Service(s) Offered: Emergency Food Pantry. Call for details.  Agency Name: Holiday representative Address: [address], [place], [place] [address] Phone: [phone] or [phone] Website: [email or web address] Service(s) Offered: Distribute food 9am-11:30 am, Tuesday-Friday, and 1-3:30pm, Monday-Friday. Food pantry Monday-Friday 1pm-3pm, fresh items, Mon.-Wed.-Fri.  Agency Name: [facility] Address: [address] [place], [place] [address] Phone: [phone] Website: [email or web address] Services Offered: Distribute food Tues and Sats from 9:00am-noon.  Closed 1st Saturday of each month. Call for details  Agency Name: [facility] [employer] Address: [address] [facility] [address], [place] [address] Phone: [phone]  Services Offered: Delivers meals every Thursday   Transportation Resources  Agency Name: [facility] Agency Address: [address] [place], [place] [address] Phone: [phone] Email: [email or web address] Website: [email or web address] Service(s) Offered: Housing services, self-sufficiency, congregate meal program, weatherization program, Field seismologist program, emergency food assistance,  housing counseling, home ownership program, wheels-towork program.  Agency Name: [facility] [employer] [id number]) Address: [address], [place], [place] [address] Phone: [phone] Website: [email or web address].[email or web address]-[email or web address] Service(s) Offered: Transportation for [employer], subscription and demand response; Dial-a-Ride for citizens 72 years of age or older.  Agency Name: Department of Social Services Address: [address] [place], [place] [address] Phone: [phone] Service(s) Offered: Child support services; child welfare services; food stamps; Medicaid; work first family assistance; and aid with fuel,  rent,  food and medicine, transportation assistance.  Agency Name: Disabled [employer] (DAV) Transportation  Network Phone: [phone] Service(s) Offered: Transports veterans to the [facility] medical center. Call  forty-eight hours in advance and leave the name, telephone  number, date, and time of appointment. Veteran will be  contacted by the driver the day before the appointment to  arrange a pick up point   Transportation Resources  Agency Name: [facility] Agency Address: [address] [place], [place] [address] Phone: [phone] Email: [email or web address] Website: [email or web address] Service(s) Offered: Housing services, self-sufficiency, congregate meal program, weatherization program, Field seismologist program, emergency food assistance,  housing counseling, home ownership program, wheels-towork program.  Agency Name: [facility] [employer] [phone]) Address: [address] [place], [place] [address] Phone: [phone] Website: [email or web address].[email or web address] Service(s) Offered: Transportation for [employer], subscription and demand response; Dial-a-Ride for citizens 89 years of age or older.  Agency Name: Department of Social Services Address: [address] [place], [place] [address] Phone: [phone] Service(s) Offered: Child support services; child welfare services; food stamps; Medicaid; work first family assistance; and aid with fuel,  rent, food and medicine, transportation assistance.  Agency Name: Disabled [employer] (DAV) Transportation  Network Phone: [phone] Service(s) Offered: Transports veterans to the [facility] medical center. Call  forty-eight hours in advance and leave the name, telephone  number, date, and time of appointment. Veteran will be  contacted by the driver the day before the appointment to  arrange a pick up point    [facility] ACTA currently provides door to door services. ACTA connects with PART daily for services to [facility]. ACTA also performs contract services to [employer] operates 27 vehicles, all but 3 mini-vans are equipped with lifts for special needs as well as the general public. ACTA drivers are each CDL certified and trained in First Aid and CPR. ACTA was established in 2002 by [employer]. An independent Industrial/product designer. ACTA operates via Cytogeneticist with required Research scientist (physical sciences) from [place]. ACTA provides over 80,000 passenger trips each year, including [facility] and [employer] sites.  Call at least by 11 AM one business day prior to needing transportation  [employer].                      [place], [place] [address]     Office Hours: Monday-Friday  8 AM - 5 PM  Agency Name: Film/video editor  [facility] Agency Address: [address] [place], [place] [address] Phone: [phone] Website: www.[email or web address] Service(s) Offered: Housing services, self-sufficiency, congregate meal program, and individual development account program.  Agency Name: [facility] [place] Address: [address] [place], [place] [address] Phone: [phone] Email: [email or web address] Website: www.[email or web address] Service(s) Offered: Housing the homeless, feeding the hungry, Company secretary, job and education related services.  Agency Name: [facility] Address: [address] [place], [place] [address] Phone: [phone] Email: [email or web address] Service(s) Offered: Counseling, problem pregnancy, advocacy for Hispanics, limited emergency financial assistance.  Agency Name: Department of Social Services Address: [address] [place], [place] [address] Phone: [phone] Website:  [email or web address] Service(s) Offered: Child support services; child welfare services; SNAP; Medicaid; work first family assistance; and aid with fuel,  rent, food and medicine.  Agency Name: Holiday representative Address: [address], [place], [place] [address] Phone: [phone] or [phone] Email: [email or web address] Service(s) Offered: Family services and transient assistance; emergency food, fuel, clothing, limited furniture, utilities; budget counseling, general counseling; give a kid a coat; thrift store; Christmas food and toys. Utility assistance, food pantry, rental  assistance, life sustaining medicine   the Institute on Aging offers a [employer] that anyone can call toll free at [phone]. The friendship line is available 24 hours a day  [employer] is a Program of All-inclusive Care for the Elderly (PACE). Their mission is to promote and sustain the independence of seniors wishing to remain in the community. They provide seniors with comprehensive long-term health, social, medical and dietary care. Their program is a safe alternative to nursing home care. [phone]  [facility] Address [facility] [address] [place], [place] [address] Phone: [phone]. . Online zoom yoga class, connect with others without leaving your home [facility] offers [place] dance cardio sessions for individuals via Zoom. This program provides: - Dance fitness activities Please contact program for more information. Servinganyone in need adults 18+ hiv/aids individuals families Call [phone]  Email [email or web address] to get more info  [employer] offers an online [employer] to individuals where they can receive help to focus on their best health. Whether you're a [employer] member or not, the neighborhood center offers a... Main Serviceshealth education  exercise & fitness  community support  services  recreation  virtual support Other Servicessupport groups Servinganyone in need adults young adults teens seniors individuals families [email or web address] to get more info  Schedule on their website  The [facility] offers an array of activities for adults age 41 and over. This program provides:- Fitness and health programs- Tech classes- Activity books Main Serviceshealth education  community support services  exercise & fitness  recreation  more education Servingseniors  Call [phone]    For more resources go online to RhodeIslandBargains.co.uk and type in you zipcode   Rent/Utility/Housing  Agency Name: [facility] Agency Address: [address] [place], [place] [address] Phone: [phone] Email: [email or web address] Website: www.[email or web address] Service(s) Offered: Housing services, self-sufficiency, congregate meal program, weatherization program, Field seismologist program, emergency food assistance,  housing counseling, home ownership program, wheels -towork program.  Agency Name: [facility] Address: [address] [place], [place] [address] Phone: [phone] (8a-4p) [phone] (8p- 10p) Email: [email or web address] Website: [email or web address].[email or web address] Service(s) Offered: A program for homeless and/or needy men that includes one-on-one counseling, life skills training and job rehabilitation.  Agency Name: [facility] [place] Address: [address], [place], [place] [address]  Phone: [phone] Website: www.alliedchurches.org Service(s) Offered: Assistance to needy in emergency with utility bills, heating fuel, and prescriptions. Shelter for homeless 7pm-7am. March 10, 2017 15  Agency Name: [facility] [place] (Developmentally Disabled) Address: [address], [place], [place] [address] Phone: [phone] Contact Person: Cathleen Corti Email: [email or web address] Website: [email or web address] Service(s) Offered: Helps individuals with developmental disabilities move from housing that is more restrictive to homes where they  can achieve greater independence and have more  opportunities.  Agency Name: [facility] Address: [address], [place], [place] [address] Phone: [phone] Email: [email or web address] Website: www.[email or web address].org Service(s) Offered: Provides affordable housing for low-income families, elderly, and disabled individuals. Offer a wide range of  programs and services, from financial planning to afterschool and summer programs.  Agency Name: Department of Social Services Address: [address] [place], [place] [address] Phone: [phone] Service(s) Offered: Child support services; child welfare services; food stamps; Medicaid; work first family assistance; and aid with fuel,  rent, food and medicine.  Agency Name: [facility] [place] [employer]. Address: [facility] [address], [place], [place]  [address] Phone: [phone] Website: www.familyabuseservices.org Service(s) Offered: 24 hour Crisis Line: [phone]; 24 hour Emergency Shelter; Transitional Housing; Support Groups; Scientist, physiological; [employer]; Hispanic Outreach: [phone];  Visitation Center: [phone].  Agency Name: [facility] [place]. Address: [address], [place], [place] [address] Phone: [phone] Service(s) Offered: CAP Services; Home and [employer]; Individual or Group Supports; Respite Care Non-Institutional Nursing;  Residential Supports; Respite Care and Personal Care Services; Transportation; Family and Friends Night; Recreational Activities; Three Nutritious Meals/Snacks; Consultation with Registered Dietician; Twenty-four hour Registered Nurse Access; Daily and [employer] and [employer]s; Camp Green Leaves; [place] for the [employer] (During Summer Months) Bingo Night (Every  Wednesday Night); Special Populations Dance Night  (Every Tuesday Night); Professional Hair Care Services.  Agency Name: [facility] Address: [address], [place], [place] [address] Phone: [phone] Contact Person: Jabier Mutton Website: [email or web address].Physicist, medical) Offered: Residential treatment facility for women; food and  clothing, educational & employment development and  transportation to work; Counsellor of financial skills;  parenting and family reunification; emotional and spiritual  support; transitional housing for program graduates.  Agency Name: [facility] Address: [address], [place], [place] [address] Phone: [phone] Email: [email or web address] Website: TaskTown.es Service(s) Offered: Public housing units for elderly, disabled, and low income people; housing choice vouchers for income eligible  applicants; shelter plus care vouchers; and Psychologist, clinical.  Agency Name: [facility] [employer] Address: [address] [place], [place] [address] Phone: [phone] Email: [email or web address] Website: www.habitatalamance.org Service(s) Offered: Build houses for families in need of decent housing. Each adult in the family must invest 200 hours of labor on  someone else's house, work with volunteers to build their own house, attend classes on budgeting, home maintenance, yard care, and attend homeowner association meetings.  Agency Name: [facility] Address: [address], [place], [place] [address] Phone: [phone] Website: www.rsli.org Service(s) Offered: Intermediate care facilities for intellectually delayed, Supervised Living in group homes for adults with developmental disabilities, Supervised Living for people who have dual diagnoses (MRMI), Independent Living, Supported Living, respite and a  variety of CAP services, pre-vocational services, day supports, and [employer].  Agency Name: [place] [employer] Phone: [phone] Website: www.NCForeclosurePrevention.gov Service(s) Offered: Zero-interest, deferred loans to homeowners struggling to pay their mortgage. Call for more information.

## 2024-01-09 NOTE — Progress Notes (Signed)
     [facility] REFERRAL        Occupational Therapy * Physical Therapy * Speech Therapy                           DATE 01/09/24   PATIENT NAME Stacy West  PATIENT MRN Mar [id number], 1981       DIAGNOSIS/DIAGNOSIS CODE   R11.2    DATE OF DISCHARGE: 01/09/24        PRIMARY CARE PHYSICIAN     Marjie Skiff, NP  PCP PHONE/FAX      Dear Provider (Name: [facility] outpatient __  Fax: [phone]   I certify that I have examined this patient and that occupational/physical/speech therapy is necessary on an outpatient basis.    The patient has expressed interest in completing their recommended course of therapy at your  location.  Once a formal order from the patient's primary care physician has been obtained, please  contact him/her to schedule an appointment for evaluation at your earliest convenience.   [x ]  Physical Therapy Evaluate and Treat - vestibular   [  ]  Occupational Therapy Evaluate and Treat  [  ]  Speech Therapy Evaluate and Treat         The patient's primary care physician (listed above) must furnish and be responsible for a formal order such that the recommended services may be furnished while under the primary physician's care, and that the plan of care will be established and reviewed every 30 days (or more often if condition necessitates).

## 2024-01-09 NOTE — Evaluation (Signed)
 Occupational Therapy Evaluation Patient Details Name: Stacy West MRN: [id number] DOB: [birth year] Today's Date: 01/09/2024   History of Present Illness   Pt is a 43 y.o. female with medical history significant of depression with anxiety, GERD, obesity, fibromyalgia, premature ovarian failure, PCOS, celiac disease, Ehlers-Danlos disease, OSA not on CPAP, who presents with nausea, vomiting, dizziness.     Clinical Impressions Pt was seen for OT evaluation this date. Prior to hospital admission, pt was generally independent however required some assist PRN for ADL transfers and LB ADL 2/2 back issues when she was having a "bad day." Pt lives with her spouse and 6yo daughter. Pt presents to acute OT demonstrating impaired ADL performance and functional mobility 2/2 dizziness with movement and some at rest (See OT problem list for additional functional deficits). Indep with bed mobility and near baseline for ADL. Pt would benefit from skilled OT services while hospitalized to address home/routines modifications and compensatory strategies to improve independence and minimize dizziness. Do not anticipate the need for follow up OT services upon acute hospital DC.    If plan is discharge home, recommend the following:   Assistance with cooking/housework;Assist for transportation     Functional Status Assessment   Patient has had a recent decline in their functional status and demonstrates the ability to make significant improvements in function in a reasonable and predictable amount of time.     Equipment Recommendations   None recommended by OT     Recommendations for Other Services         Precautions/Restrictions   Precautions Precautions: Fall Recall of Precautions/Restrictions: Intact Restrictions Weight Bearing Restrictions Per Provider Order: No     Mobility Bed Mobility Overal bed mobility: Needs Assistance Bed Mobility: Rolling, Sidelying to Sit, Supine to Sit, Sit  to Supine, Sit to Sidelying Rolling: Contact guard assist, Min assist         General bed mobility comments: CGA-minA provided due to testing positions, but pt able to move without assistance    Transfers                   General transfer comment: deferred      Balance Overall balance assessment: Needs assistance Sitting-balance support: Feet supported Sitting balance-Leahy Scale: Good                                     ADL either performed or assessed with clinical judgement   ADL Overall ADL's : Modified independent;At baseline                                       General ADL Comments: Pt requires PRN assist from spouse for LB ADL 2/2 back issues, pt near baseline     Vision         Perception         Praxis         Pertinent Vitals/Pain Pain Assessment Pain Assessment: Faces Faces Pain Scale: Hurts a little bit Pain Location: neck with testing positions Pain Descriptors / Indicators: Sore Pain Intervention(s): Limited activity within patient's tolerance, Monitored during session, Repositioned     Extremity/Trunk Assessment Upper Extremity Assessment Upper Extremity Assessment: Overall WFL for tasks assessed   Lower Extremity Assessment Lower Extremity Assessment: Overall WFL for tasks assessed   Cervical / Trunk Assessment Cervical /  Trunk Assessment: Neck Surgery   Communication     Cognition Arousal: Alert Behavior During Therapy: WFL for tasks assessed/performed Cognition: No apparent impairments                               Following commands: Intact       Cueing  General Comments          Exercises     Shoulder Instructions      Home Living Family/patient expects to be discharged to:: Private residence Living Arrangements: Spouse/significant other;Children (6yo kid) Available Help at Discharge: Family;Available PRN/intermittently;Available 24 hours/day Type of Home:  House Home Access: Stairs to enter [employer] of Steps: 1/2 step   Home Layout: Two level;Able to live on main level with bedroom/bathroom     Bathroom Shower/Tub: Producer, television/film/video: Standard     Home Equipment: Shower seat          Prior Functioning/Environment Prior Level of Function : Needs assist       Physical Assist : Mobility (physical);ADLs (physical)     Mobility Comments: generally independent, PRN assist for transfers when having a "bad day" ADLs Comments: assist for LB dressing 2/2 bad back, was able to do some pot planting seated, does not work    OT Problem List: Impaired balance (sitting and/or standing)   OT Treatment/Interventions: Self-care/ADL training;Therapeutic activities;DME and/or AE instruction;Patient/family education;Balance training      OT Goals(Current goals can be found in the care plan section)   Acute Rehab OT Goals Patient Stated Goal: not be dizzy OT Goal Formulation: With patient/family Time For Goal Achievement: 01/23/24 Potential to Achieve Goals: Good ADL Goals Additional ADL Goal #1: Pt will verbalize plan to implement at least 1 learned compensatory strategy for ADL/IADL participation to minimize dizziness.   OT Frequency:  Min 1X/week    Co-evaluation PT/OT/SLP Co-Evaluation/Treatment: Yes Reason for Co-Treatment: For patient/therapist safety;To address functional/ADL transfers PT goals addressed during session: Mobility/safety with mobility;Balance OT goals addressed during session: ADL's and self-care      AM-PAC OT "6 Clicks" Daily Activity     Outcome Measure Help from another person eating meals?: None Help from another person taking care of personal grooming?: None Help from another person toileting, which includes using toliet, bedpan, or urinal?: None Help from another person bathing (including washing, rinsing, drying)?: A Little Help from another person to put on and taking off  regular upper body clothing?: None Help from another person to put on and taking off regular lower body clothing?: A Little 6 Click Score: 22   End of Session Nurse Communication: Mobility status  Activity Tolerance: Patient tolerated treatment well;Other (comment) (dizzy) Patient left: in bed;with call bell/phone within reach;with bed alarm set;with family/visitor present;with nursing/sitter in room  OT Visit Diagnosis: Other abnormalities of gait and mobility (R26.89)                Time: 1610-9604 OT Time Calculation (min): 40 min Charges:  OT General Charges $OT Visit: 1 Visit OT Evaluation $OT Eval Low Complexity: 1 Low  Arman Filter., MPH, MS, OTR/L ascom [phone] 01/09/24, 1:10 PM

## 2024-01-09 NOTE — TOC Initial Note (Signed)
 Transition of Care [facility]) - Initial/Assessment Note    Patient Details  Name: Stacy West MRN: [id number] Date of Birth: July 15, 1981  [facility]) CM/SW Contact:    Chapman Fitch, RN Phone Number: 01/09/2024, 3:21 PM  Clinical Narrative:                  Patient to discharge today Patient states that her husband will be transporting at discharge   Food, transportation, utilities, housing, and social resources added to AVS  Patient states that she does not have a preference of facility for vestibular PT.  Referral faced to [facility] outpatient therapy      Patient Goals and CMS Choice            Expected Discharge Plan and Services         Expected Discharge Date: 01/09/24                                    Prior Living Arrangements/Services                       Activities of Daily Living   ADL Screening (condition at time of admission) Independently performs ADLs?: No Does the patient have a NEW difficulty with bathing/dressing/toileting/self-feeding that is expected to last >3 days?: Yes (Initiates electronic notice to provider for possible OT consult) Does the patient have a NEW difficulty with getting in/out of bed, walking, or climbing stairs that is expected to last >3 days?: Yes (Initiates electronic notice to provider for possible PT consult) Does the patient have a NEW difficulty with communication that is expected to last >3 days?: No Is the patient deaf or have difficulty hearing?: No Does the patient have difficulty seeing, even when wearing glasses/contacts?: No Does the patient have difficulty concentrating, remembering, or making decisions?: No  Permission Sought/Granted                  Emotional Assessment              Admission diagnosis:  Bradycardia [R00.1] Hypophosphatemia [E83.39] Nausea & vomiting [R11.2] Patient Active Problem List   Diagnosis Date Noted   History of PCOS 01/09/2024   Nausea &  vomiting 01/08/2024   Depression with anxiety 01/08/2024   Obesity (BMI 30-39.9) 01/08/2024   Sinus bradycardia 01/08/2024   Hypophosphatemia 01/08/2024   URI (upper respiratory infection) 12/13/2023   S/P cervical spinal fusion 08/18/2023   Cervical fusion syndrome 08/18/2023   Bilateral occipital neuralgia 08/18/2023   Chronic thoracic spine pain 08/18/2023   Chronic radicular cervical pain 08/18/2023   Thoracogenic scoliosis of thoracic region 06/08/2023   B12 deficiency 04/29/2023   Pain in joint, ankle and foot 12/21/2022   Pain in left lower leg 06/22/2022   Other allergic rhinitis 04/26/2022   Chronic cough 04/26/2022   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HCC) 02/23/2022   Chronic neck pain 12/20/2021   Chronic pain syndrome 12/20/2021   Fibromyalgia 12/20/2021   Sinus tachycardia 08/17/2021   Gastroesophageal reflux disease 05/07/2021   Moderate episode of recurrent major depressive disorder (HCC) 01/15/2021   Celiac disease 01/31/2020   Right ovarian cyst 08/15/2019   Cervical spondylosis without myelopathy 07/04/2019   MVP (mitral valve prolapse) 05/31/2019   Ehlers-Danlos syndrome type III 05/17/2019   PCOS (polycystic ovarian syndrome) 05/17/2019   Insulin resistance 10/21/2016   Chronic fatigue syndrome with fibromyalgia 01/31/2007  Degenerative disc disease at L5-S1 level 01/31/2007   PCP:  Marjie Skiff, NP Pharmacy:   [facility] [phone] [place] Rough, [facility] - [address] [place] [address] Phone: [phone] Fax: [phone]     Social Drivers of Health (SDOH) Social History: SDOH Screenings   Food Insecurity: Food Insecurity Present (01/09/2024)  Housing: High Risk (01/09/2024)  Transportation Needs: Unmet Transportation Needs (01/09/2024)  Utilities: At Risk (01/09/2024)  Alcohol Screen: Low Risk  (02/24/2023)  Depression (PHQ2-9): High Risk (09/09/2023)  Financial Resource Strain: High Risk (02/24/2023)  Physical Activity:  Sufficiently Active (02/24/2023)  Social Connections: Socially Isolated (02/24/2023)  Stress: Stress Concern Present (02/24/2023)  Tobacco Use: Medium Risk (01/08/2024)   SDOH Interventions: Food Insecurity Interventions: Other (Comment), Inpatient TOC (resources added to AVS) Housing Interventions: Inpatient TOC, Other (Comment) (resources added to AVS) Transportation Interventions: Other (Comment), Inpatient TOC (resources added to avs) Utilities Interventions: Inpatient TOC, Other (Comment) (resources added to avs) Social Connections Interventions: Inpatient TOC, Other (Comment) (resources added to avs)   Readmission Risk Interventions     No data to display

## 2024-01-09 NOTE — Telephone Encounter (Signed)
 Refilled 09/29/23 # 90 with 1 refill. Requested Prescriptions  Refused Prescriptions Disp Refills   cetirizine (ZYRTEC) 10 MG tablet [Pharmacy Med Name: CETIRIZINE HCL 10 MG TABLET] 90 tablet 1    Sig: TAKE 1 TABLET BY MOUTH EVERY DAY     Ear, Nose, and Throat:  Antihistamines 2 Passed - 01/09/2024  1:50 PM      Passed - Cr in normal range and within 360 days    Creatinine, Ser  Date Value Ref Range Status  01/09/2024 0.61 0.44 - 1.00 mg/dL Final         Passed - Valid encounter within last 12 months    Recent Outpatient Visits           3 weeks ago Upper respiratory tract infection, unspecified type   [place] [facility] [place], C[place] T, NP   4 months ago Moderate episode of recurrent major depressive disorder (HCC)   [place] [facility] [place], Corrie Dandy T, NP   7 months ago Possible pregnancy   [place] [facility] [place], [place] T, NP   8 months ago 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HCC)   [place] [facility] [place], Kennedy T, NP   10 months ago Moderate episode of recurrent major depressive disorder [place] [facility])   [place] [facility] [place], Dorie Rank, NP

## 2024-01-11 ENCOUNTER — Encounter: Payer: Self-pay | Admitting: Nurse Practitioner

## 2024-01-11 MED ORDER — PREDNISONE 10 MG (21) PO TBPK: ORAL_TABLET | ORAL | 0 refills | Status: DC

## 2024-01-15 NOTE — Patient Instructions (Signed)
 Ear Barotrauma, Adult  Ear barotrauma is an injury to the middle ear. This happens when there is a change in air pressure. What are the causes? Flying in an airplane. Going deeper or coming to the surface too quickly when scuba diving. Going to a high place (high altitude) quickly. Being too close to an explosion or blast. What increases the risk? An infection in your middle ear. A sinus infection. A cold. An allergy to something around you. Having small tubes that lead from the middle ear to the back of the nose (eustachian tubes). Recent ear surgery. What are the signs or symptoms? Symptoms of this condition include: Ear pain. Sudden loss of hearing. This may be partial or complete. A feeling that your ear is clogged. Ringing in your ears. Other symptoms may include: Dizziness that creates a spinning, rocking, or tumbling feeling. Loss of balance. Feeling like you may vomit (nausea). Headache. Pain in your face. How is this treated? Medicines. Using a method to "pop" your ears or make the pressure in the middle ear the same as in the outer ear. Ways to do this include: Yawning. Chewing gum. Swallowing. Holding your nose closed and gently blowing. Surgery. This is done in very bad cases. Follow these instructions at home: Medicines Take over-the-counter and prescription medicines only as told by your doctor. Do not put anything into your ears to clean or unplug them. Activity Until your doctor says it is okay: Do not travel to high places. Do not fly in an airplane. Do not scuba dive. How is this prevented? Avoid doing things that cause pressure changes when you have symptoms of a cold or a stuffy nose. If you have a stuffy nose and will be flying, use a medicine that helps a stuffy nose (nasal decongestant) about 30-60 minutes before flying. When you fly: Chew gum. Swallow often and swallow hard during takeoff and landing. Hold your nose closed and gently blow to  "pop" your ears. Yawn during air pressure changes. If scuba diving, dive feet first and "pop" your ears often as you go deeper in the water. Contact a doctor if: Your symptoms get worse. Your symptoms do not get better. You have new symptoms. You have a fever. Get help right away if: You have very bad ear pain. You have a very bad headache. You are very dizzy. You have blood or pus coming from your ear. You have very bad balance problems. You cannot move or feel part of your face. Summary Ear barotrauma is an injury to the middle ear. This condition may be treated with medicines or by "popping" your ears. You can take steps to help prevent this condition. This information is not intended to replace advice given to you by your health care provider. Make sure you discuss any questions you have with your health care provider. Document Revised: 07/29/2023 Document Reviewed: 07/29/2023 Elsevier Patient Education  2024 [employer].

## 2024-01-18 ENCOUNTER — Other Ambulatory Visit (INDEPENDENT_AMBULATORY_CARE_PROVIDER_SITE_OTHER): Payer: Self-pay | Admitting: Nurse Practitioner

## 2024-01-18 ENCOUNTER — Ambulatory Visit: Payer: 59 | Admitting: Nurse Practitioner

## 2024-01-18 ENCOUNTER — Encounter: Payer: Self-pay | Admitting: Nurse Practitioner

## 2024-01-18 VITALS — BP 106/68 | HR 97 | Temp 98.7°F | Resp 18 | Ht 71.0 in | Wt 234.4 lb

## 2024-01-18 DIAGNOSIS — H9203 Otalgia, bilateral: Secondary | ICD-10-CM | POA: Diagnosis not present

## 2024-01-18 DIAGNOSIS — D8940 Mast cell activation, unspecified: Secondary | ICD-10-CM

## 2024-01-18 DIAGNOSIS — I83819 Varicose veins of unspecified lower extremities with pain: Secondary | ICD-10-CM

## 2024-01-18 NOTE — Assessment & Plan Note (Signed)
 Is to see Dr. Livingston Diones at [facility] for further work-up and evaluation for this. Having some vestibular issues at present, referral to vestibular rehab.

## 2024-01-18 NOTE — Progress Notes (Signed)
 BP 106/68 (BP Location: Left Arm, Patient Position: Sitting, Cuff Size: Normal)   Pulse 97   Temp 98.7 F (37.1 C) (Oral)   Resp 18   Ht 5\' 11"  (1.803 m)   Wt 234 lb 6.4 oz (106.3 kg)   LMP  (LMP Unknown)   SpO2 97%   BMI 32.69 kg/m    Subjective:    Patient ID: Stacy West, female    DOB: 04-19-1981, 43 y.o.   MRN: [id number]  HPI: Stacy West is a 43 y.o. female  Chief Complaint  Patient presents with   Referral    Patient states she would like to discuss getting a referral to ENT. States she has been having issues with both of her ears.    EAR ISSUES Was in ER on 01/08/24, food poisoning/dehydration.  Was sent home with antihistamine (Xyzal) and antiemetic.  Currently ears feel like Swimmer's ear and not treatments are helping, has been treated with multiple things in past for similar and have not helped.   ER recommended vestibular physical therapy and ENT.   Duration: months Involved ear(s): bilateral Severity:  2/10  Quality:  dull, aching, and throbbing Fever: no Otorrhea: no Upper respiratory infection symptoms:  food poisoning only Pruritus: yes Hearing loss: no Water immersion no Using Q-tips:  occasional Recurrent otitis media: no Status: fluctuating Treatments attempted: multiple OTC treatments without benefit + Prednisone in past   Relevant past medical, surgical, family and social history reviewed and updated as indicated. Interim medical history since our last visit reviewed. Allergies and medications reviewed and updated.  Review of Systems  Constitutional:  Negative for activity change, appetite change, diaphoresis, fatigue and fever.  HENT:  Positive for ear pain and postnasal drip. Negative for ear discharge.   Respiratory:  Negative for cough, chest tightness, shortness of breath and wheezing.   Cardiovascular:  Negative for chest pain, palpitations and leg swelling.  Gastrointestinal: Negative.   Neurological: Negative.    Psychiatric/Behavioral: Negative.      Per HPI unless specifically indicated above     Objective:    BP 106/68 (BP Location: Left Arm, Patient Position: Sitting, Cuff Size: Normal)   Pulse 97   Temp 98.7 F (37.1 C) (Oral)   Resp 18   Ht 5\' 11"  (1.803 m)   Wt 234 lb 6.4 oz (106.3 kg)   LMP  (LMP Unknown)   SpO2 97%   BMI 32.69 kg/m   Wt Readings from Last 3 Encounters:  01/18/24 234 lb 6.4 oz (106.3 kg)  01/08/24 233 lb 11 oz (106 kg)  12/13/23 234 lb 6.4 oz (106.3 kg)    Physical Exam Vitals and nursing note reviewed.  Constitutional:      General: She is awake. She is not in acute distress.    Appearance: She is well-developed and well-groomed. She is obese. She is not ill-appearing or toxic-appearing.  HENT:     Head: Normocephalic.     Right Ear: Hearing, ear canal and external ear normal. A middle ear effusion is present. There is no impacted cerumen. Tympanic membrane is not injected.     Left Ear: Hearing, ear canal and external ear normal. A middle ear effusion is present. There is no impacted cerumen. Tympanic membrane is not injected.     Nose: Rhinorrhea present. Rhinorrhea is clear.     Mouth/Throat:     Mouth: Mucous membranes are moist.     Pharynx: Posterior oropharyngeal erythema (mild) present. No pharyngeal swelling or oropharyngeal  exudate.  Eyes:     General: Lids are normal.        Right eye: No discharge.        Left eye: No discharge.     Conjunctiva/sclera: Conjunctivae normal.     Pupils: Pupils are equal, round, and reactive to light.  Neck:     Thyroid: No thyromegaly.     Vascular: No carotid bruit.  Cardiovascular:     Rate and Rhythm: Normal rate and regular rhythm.     Heart sounds: Normal heart sounds. No murmur heard.    No gallop.  Pulmonary:     Effort: Pulmonary effort is normal. No accessory muscle usage or respiratory distress.     Breath sounds: Normal breath sounds.  Abdominal:     General: Bowel sounds are normal. There  is no distension.     Palpations: Abdomen is soft.     Tenderness: There is no abdominal tenderness.  Musculoskeletal:     Cervical back: Normal range of motion and neck supple.     Right lower leg: No edema.     Left lower leg: No edema.  Lymphadenopathy:     Cervical: No cervical adenopathy.  Skin:    General: Skin is warm and dry.  Neurological:     Mental Status: She is alert and oriented to person, place, and time.     Deep Tendon Reflexes: Reflexes are normal and symmetric.     Reflex Scores:      Brachioradialis reflexes are 2+ on the right side and 2+ on the left side.      Patellar reflexes are 2+ on the right side and 2+ on the left side. Psychiatric:        Attention and Perception: Attention normal.        Mood and Affect: Mood normal.        Speech: Speech normal.        Behavior: Behavior normal. Behavior is cooperative.        Thought Content: Thought content normal.     Results for orders placed or performed during the hospital encounter of 01/08/24  Resp panel by RT-PCR (RSV, Flu A&B, Covid) Anterior Nasal Swab   Collection Time: 01/08/24  4:50 PM   Specimen: Anterior Nasal Swab  Result Value Ref Range   SARS Coronavirus 2 by RT PCR NEGATIVE NEGATIVE   Influenza A by PCR NEGATIVE NEGATIVE   Influenza B by PCR NEGATIVE NEGATIVE   Resp Syncytial Virus by PCR NEGATIVE NEGATIVE  Lipase, blood   Collection Time: 01/08/24  5:00 PM  Result Value Ref Range   Lipase 35 11 - 51 U/L  Comprehensive metabolic panel   Collection Time: 01/08/24  5:00 PM  Result Value Ref Range   Sodium 139 135 - 145 mmol/L   Potassium 3.6 3.5 - 5.1 mmol/L   Chloride 105 98 - 111 mmol/L   CO2 22 22 - 32 mmol/L   Glucose, Bld 107 (H) 70 - 99 mg/dL   BUN 14 6 - 20 mg/dL   Creatinine, Ser 7.82 0.44 - 1.00 mg/dL   Calcium 9.5 8.9 - 95.6 mg/dL   Total Protein 6.4 (L) 6.5 - 8.1 g/dL   Albumin 4.1 3.5 - 5.0 g/dL   AST 19 15 - 41 U/L   ALT 20 0 - 44 U/L   Alkaline Phosphatase 46 38 -  126 U/L   Total Bilirubin 0.7 0.0 - 1.2 mg/dL   GFR, Estimated >21 >30 mL/min  Anion gap 12 5 - 15  CBC with Differential   Collection Time: 01/08/24  5:00 PM  Result Value Ref Range   WBC 10.3 4.0 - 10.5 K/uL   RBC 4.25 3.87 - 5.11 MIL/uL   Hemoglobin 13.5 12.0 - 15.0 g/dL   HCT 16.1 09.6 - 04.5 %   MCV 91.3 80.0 - 100.0 fL   MCH 31.8 26.0 - 34.0 pg   MCHC 34.8 30.0 - 36.0 g/dL   RDW 40.9 81.1 - 91.4 %   Platelets 319 150 - 400 K/uL   nRBC 0.0 0.0 - 0.2 %   Neutrophils Relative % 76 %   Neutro Abs 7.6 1.7 - 7.7 K/uL   Lymphocytes Relative 19 %   Lymphs Abs 2.0 0.7 - 4.0 K/uL   Monocytes Relative 5 %   Monocytes Absolute 0.6 0.1 - 1.0 K/uL   Eosinophils Relative 0 %   Eosinophils Absolute 0.0 0.0 - 0.5 K/uL   Basophils Relative 0 %   Basophils Absolute 0.0 0.0 - 0.1 K/uL   Immature Granulocytes 0 %   Abs Immature Granulocytes 0.04 0.00 - 0.07 K/uL  TSH   Collection Time: 01/08/24  5:00 PM  Result Value Ref Range   TSH 1.038 0.350 - 4.500 uIU/mL  T4, free   Collection Time: 01/08/24  5:00 PM  Result Value Ref Range   Free T4 0.87 0.61 - 1.12 ng/dL  Magnesium   Collection Time: 01/08/24  5:00 PM  Result Value Ref Range   Magnesium 2.1 1.7 - 2.4 mg/dL  Phosphorus   Collection Time: 01/08/24  5:00 PM  Result Value Ref Range   Phosphorus 1.3 (L) 2.5 - 4.6 mg/dL  Troponin I (High Sensitivity)   Collection Time: 01/08/24  5:00 PM  Result Value Ref Range   Troponin I (High Sensitivity) <2 <18 ng/L  Urinalysis, Routine w reflex microscopic -Urine, Clean Catch   Collection Time: 01/08/24  7:11 PM  Result Value Ref Range   Color, Urine YELLOW (A) YELLOW   APPearance CLOUDY (A) CLEAR   Specific Gravity, Urine 1.024 1.005 - 1.030   pH 7.0 5.0 - 8.0   Glucose, UA NEGATIVE NEGATIVE mg/dL   Hgb urine dipstick NEGATIVE NEGATIVE   Bilirubin Urine NEGATIVE NEGATIVE   Ketones, ur 20 (A) NEGATIVE mg/dL   Protein, ur NEGATIVE NEGATIVE mg/dL   Nitrite NEGATIVE NEGATIVE    Leukocytes,Ua TRACE (A) NEGATIVE   RBC / HPF 0-5 0 - 5 RBC/hpf   WBC, UA 0 0 - 5 WBC/hpf   Bacteria, UA RARE (A) NONE SEEN   Squamous Epithelial / HPF 11-20 0 - 5 /HPF   Mucus PRESENT    Amorphous Crystal PRESENT   Urine Drug Screen, Qualitative ([facility] only)   Collection Time: 01/08/24  7:11 PM  Result Value Ref Range   Tricyclic, Ur Screen NONE DETECTED NONE DETECTED   Amphetamines, Ur Screen NONE DETECTED NONE DETECTED   MDMA (Ecstasy)Ur Screen NONE DETECTED NONE DETECTED   Cocaine Metabolite,Ur [place]  NONE DETECTED NONE DETECTED   Opiate, Ur Screen NONE DETECTED NONE DETECTED   Phencyclidine (PCP) Ur S NONE DETECTED NONE DETECTED   Cannabinoid 50 Ng, Ur [place] POSITIVE (A) NONE DETECTED   Barbiturates, Ur Screen NONE DETECTED NONE DETECTED   Benzodiazepine, Ur Scrn NONE DETECTED NONE DETECTED   Methadone Scn, Ur NONE DETECTED NONE DETECTED  POC urine preg, ED   Collection Time: 01/08/24  7:11 PM  Result Value Ref Range   Preg Test, Ur  Negative Negative  Troponin I (High Sensitivity)   Collection Time: 01/08/24  8:31 PM  Result Value Ref Range   Troponin I (High Sensitivity) <2 <18 ng/L  HIV Antibody (routine testing w rflx)   Collection Time: 01/08/24 11:17 PM  Result Value Ref Range   HIV Screen 4th Generation wRfx Non Reactive Non Reactive  Phosphorus   Collection Time: 01/08/24 11:17 PM  Result Value Ref Range   Phosphorus 3.5 2.5 - 4.6 mg/dL  Basic metabolic panel   Collection Time: 01/09/24  5:15 AM  Result Value Ref Range   Sodium 142 135 - 145 mmol/L   Potassium 3.6 3.5 - 5.1 mmol/L   Chloride 111 98 - 111 mmol/L   CO2 21 (L) 22 - 32 mmol/L   Glucose, Bld 98 70 - 99 mg/dL   BUN 10 6 - 20 mg/dL   Creatinine, Ser 1.61 0.44 - 1.00 mg/dL   Calcium 8.6 (L) 8.9 - 10.3 mg/dL   GFR, Estimated >09 >60 mL/min   Anion gap 10 5 - 15  CBC   Collection Time: 01/09/24  5:15 AM  Result Value Ref Range   WBC 8.3 4.0 - 10.5 K/uL   RBC 3.71 (L) 3.87 - 5.11 MIL/uL   Hemoglobin 11.9  (L) 12.0 - 15.0 g/dL   HCT 45.4 (L) 09.8 - 11.9 %   MCV 94.1 80.0 - 100.0 fL   MCH 32.1 26.0 - 34.0 pg   MCHC 34.1 30.0 - 36.0 g/dL   RDW 14.7 82.9 - 56.2 %   Platelets 267 150 - 400 K/uL   nRBC 0.0 0.0 - 0.2 %      Assessment & Plan:   Problem List Items Addressed This Visit       Other   Ear pain, bilateral - Primary   Pain to bilateral ears, has been waxing and waning for some time.  Have tried multiple antihistamines and nasal sprays without benefit.  Will place referral to ENT per request for further recommendations.       Relevant Orders   Ambulatory referral to ENT   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HCC)   Is to see Dr. Livingston Diones at [facility] for further work-up and evaluation for this. Having some vestibular issues at present, referral to vestibular rehab.      Relevant Orders   Ambulatory referral to Occupational Therapy   Ambulatory referral to ENT     Follow up plan: Return if symptoms worsen or fail to improve.

## 2024-01-18 NOTE — Assessment & Plan Note (Addendum)
 Pain to bilateral ears, has been waxing and waning for some time.  Have tried multiple antihistamines and nasal sprays without benefit.  Will place referral to ENT per request for further recommendations.

## 2024-01-19 ENCOUNTER — Encounter (INDEPENDENT_AMBULATORY_CARE_PROVIDER_SITE_OTHER): Payer: Self-pay | Admitting: Nurse Practitioner

## 2024-01-19 ENCOUNTER — Encounter: Payer: Self-pay | Admitting: Nurse Practitioner

## 2024-01-19 ENCOUNTER — Ambulatory Visit (INDEPENDENT_AMBULATORY_CARE_PROVIDER_SITE_OTHER): Payer: Self-pay

## 2024-01-19 DIAGNOSIS — I83819 Varicose veins of unspecified lower extremities with pain: Secondary | ICD-10-CM

## 2024-01-24 ENCOUNTER — Encounter (INDEPENDENT_AMBULATORY_CARE_PROVIDER_SITE_OTHER): Payer: Self-pay | Admitting: Nurse Practitioner

## 2024-02-10 DIAGNOSIS — H9313 Tinnitus, bilateral: Secondary | ICD-10-CM | POA: Diagnosis not present

## 2024-02-10 DIAGNOSIS — H6983 Other specified disorders of Eustachian tube, bilateral: Secondary | ICD-10-CM | POA: Diagnosis not present

## 2024-02-10 DIAGNOSIS — H9311 Tinnitus, right ear: Secondary | ICD-10-CM | POA: Diagnosis not present

## 2024-02-10 DIAGNOSIS — R42 Dizziness and giddiness: Secondary | ICD-10-CM | POA: Diagnosis not present

## 2024-02-10 DIAGNOSIS — J301 Allergic rhinitis due to pollen: Secondary | ICD-10-CM | POA: Diagnosis not present

## 2024-02-14 ENCOUNTER — Ambulatory Visit: Payer: Self-pay

## 2024-02-14 VITALS — Ht 71.0 in | Wt 240.0 lb

## 2024-02-14 DIAGNOSIS — Z0000 Encounter for general adult medical examination without abnormal findings: Secondary | ICD-10-CM

## 2024-02-14 NOTE — Progress Notes (Signed)
 Subjective:   Stacy West is a 43 y.o. who presents for a Medicare Wellness preventive visit.  Visit Complete: Virtual I connected with  Clarene Reamer on 02/14/24 by a audio enabled telemedicine application and verified that I am speaking with the correct person using two identifiers.  Patient Location: Home  Provider Location: Office/Clinic  I discussed the limitations of evaluation and management by telemedicine. The patient expressed understanding and agreed to proceed.  Vital Signs: Because this visit was a virtual/telehealth visit, some criteria may be missing or patient reported. Any vitals not documented were not able to be obtained and vitals that have been documented are patient reported.  VideoDeclined- This patient declined Librarian, academic. Therefore the visit was completed with audio only.  Persons Participating in Visit: Patient.  AWV Questionnaire: No: Patient Medicare AWV questionnaire was not completed prior to this visit.  Cardiac Risk Factors include: obesity (BMI >30kg/m2);sedentary lifestyle     Objective:    Today's Vitals   02/14/24 1355  Weight: 240 lb (108.9 kg)  Height: 5\' 11"  (1.803 m)  PainSc: 4    Body mass index is 33.47 kg/m.     02/14/2024    2:21 PM 01/09/2024   12:16 AM 01/08/2024    4:32 PM 10/06/2023    9:20 AM 08/18/2023   10:14 AM 02/08/2023    3:32 PM 12/21/2022    9:49 AM  Advanced Directives  Does Patient Have a Medical Advance Directive? No No No No No No No  Would patient like information on creating a medical advance directive? No - Patient declined No - Patient declined No - Patient declined No - Patient declined No - Patient declined No - Patient declined     Current Medications (verified) Outpatient Encounter Medications as of 02/14/2024  Medication Sig   albuterol (VENTOLIN HFA) 108 (90 Base) MCG/ACT inhaler Inhale 1-2 puffs into the lungs every 4 (four) hours as needed for wheezing or  shortness of breath (cough).   cyclobenzaprine (FLEXERIL) 10 MG tablet Take 1 tablet (10 mg total) by mouth 3 (three) times daily as needed for muscle spasms.   guanFACINE (INTUNIV) 2 MG TB24 ER tablet Take 2 mg by mouth daily.   Levomefolate Glucosamine (METHYL-FOLATE PO) Take by mouth.   medroxyPROGESTERone (PROVERA) 10 MG tablet Take 10 mg by mouth daily. Per patient takes for 10 days then off for 2 weeks then repeats cycle   metFORMIN (GLUCOPHAGE-XR) 500 MG 24 hr tablet Take 4 tablets by mouth at bedtime.   montelukast (SINGULAIR) 10 MG tablet TAKE 1 TABLET BY MOUTH EVERYDAY AT BEDTIME   NALTREXONE HCL PO Take 1.5 mg by mouth daily.   Omega-3 1000 MG CAPS Take 1 capsule by mouth every morning.   pantoprazole (PROTONIX) 40 MG tablet TAKE 1 TABLET BY MOUTH EVERY DAY   pregabalin (LYRICA) 50 MG capsule Take 1 capsule (50 mg total) by mouth at bedtime.   trazodone (DESYREL) 300 MG tablet Take 300 mg by mouth at bedtime.   cetirizine (ZYRTEC) 10 MG tablet TAKE 1 TABLET BY MOUTH EVERY DAY (Patient not taking: Reported on 01/18/2024)   Cholecalciferol 1.25 MG (50000 UT) TABS Take 1 tablet by mouth once a week. (Patient not taking: Reported on 01/18/2024)   cyanocobalamin (VITAMIN B12) 1000 MCG/ML injection Inject 1 mL (1,000 mcg total) into the muscle every 30 (thirty) days. (Patient not taking: Reported on 01/18/2024)   DULoxetine (CYMBALTA) 30 MG capsule Take 30 mg by mouth daily. (Patient  not taking: Reported on 01/18/2024)   levocetirizine (XYZAL) 5 MG tablet TAKE 1 TABLET BY MOUTH EVERY DAY IN THE EVENING (Patient not taking: Reported on 01/18/2024)   SYRINGE-NEEDLE, DISP, 3 ML (B-D 3CC LUER-LOK SYR 20GX1") 20G X 1" 3 ML MISC Use to inject B12 monthly intramuscular. (Patient not taking: Reported on 02/14/2024)   No facility-administered encounter medications on file as of 02/14/2024.    Allergies (verified) Carisoprodol-aspirin-codeine, Carisoprodol, Percocet  [oxycodone-acetaminophen], Gluten meal, and  Tramadol   History: Past Medical History:  Diagnosis Date   Abnormal Pap smear of cervix    Allergy    Celiac disease    Depression    Ehlers-Danlos disease    Fibromyalgia    Heart murmur    Insulin resistance    Miscarriage    Other cervical disc degeneration at C4-C5 level    PCOS (polycystic ovarian syndrome)    PCOS (polycystic ovarian syndrome)    Premature ovarian failure    Recurrent upper respiratory infection (URI)    SI (sacroiliac) joint dysfunction    Sleep apnea    Thyroid disease    Past Surgical History:  Procedure Laterality Date   ADENOIDECTOMY     BACK SURGERY     COLPOSCOPY     FINGER SURGERY Left    NECK SURGERY  07/2020   TONSILLECTOMY     WISDOM TOOTH EXTRACTION     Family History  Problem Relation Age of Onset   Heart murmur Mother    Asthma Mother    Skin cancer Father    COPD Father    Bipolar disorder Brother    Depression Brother    Alcoholism Brother    Depression Brother    Heart murmur Maternal Aunt    Breast cancer Maternal Aunt    Early menopause Maternal Aunt    Asthma Maternal Grandmother    Social History   Socioeconomic History   Marital status: Married    Spouse name: Tenny Craw   Number of children: 1   Years of education: Not on file   Highest education level: Associate degree: academic program  Occupational History   Occupation: disability  Tobacco Use   Smoking status: Former    Current packs/day: 0.00    Average packs/day: 1 pack/day for 18.0 years (18.0 ttl pk-yrs)    Types: Cigarettes    Start date: 2000    Quit date: 2018    Years since quitting: 7.2   Smokeless tobacco: Never  Vaping Use   Vaping status: Never Used  Substance and Sexual Activity   Alcohol use: Not Currently    Comment: occ   Drug use: Not Currently   Sexual activity: Yes    Birth control/protection: None  Other Topics Concern   Not on file  Social History Narrative   Not on file   Social Drivers of Health   Financial Resource  Strain: High Risk (02/14/2024)   Overall Financial Resource Strain (CARDIA)    Difficulty of Paying Living Expenses: Very hard  Food Insecurity: Food Insecurity Present (02/14/2024)   Hunger Vital Sign    Worried About Running Out of Food in the Last Year: Sometimes true    Ran Out of Food in the Last Year: Sometimes true  Transportation Needs: Unmet Transportation Needs (02/14/2024)   PRAPARE - Transportation    Lack of Transportation (Medical): Yes    Lack of Transportation (Non-Medical): Yes  Physical Activity: Sufficiently Active (02/14/2024)   Exercise Vital Sign    Days  of Exercise per Week: 5 days    Minutes of Exercise per Session: 30 min  Stress: Stress Concern Present (02/14/2024)   [employer] of Occupational Health - Occupational Stress Questionnaire    Feeling of Stress : Rather much  Social Connections: Socially Isolated (02/14/2024)   Social Connection and Isolation Panel [NHANES]    Frequency of Communication with Friends and Family: Never    Frequency of Social Gatherings with Friends and Family: Never    Attends Religious Services: Never    Database administrator or Organizations: No    Attends Engineer, structural: Never    Marital Status: Married    Tobacco Counseling Counseling given: Not Answered    Clinical Intake:  Pre-visit preparation completed: Yes  Pain : 0-10 Pain Score: 4  Pain Type: Chronic pain Pain Location: Neck Pain Descriptors / Indicators: Aching     BMI - recorded: 33.47 Nutritional Status: BMI > 30  Obese Nutritional Risks: Nausea/ vomitting/ diarrhea (nausea) Diabetes: No  Lab Results  Component Value Date   HGBA1C 5.2 02/28/2023   HGBA1C 5.2 12/25/2021     How often do you need to have someone help you when you read instructions, pamphlets, or other written materials from your doctor or pharmacy?: 3 - Sometimes (difficulty with comprehension) What is the last grade level you completed in school?: Associates  Degree  Interpreter Needed?: No  Information entered by :: Tora Kindred, CMA   Activities of Daily Living     02/14/2024    2:01 PM 01/09/2024   12:16 AM  In your present state of health, do you have any difficulty performing the following activities:  Hearing? 0 0  Vision? 0 0  Difficulty concentrating or making decisions? 1 0  Walking or climbing stairs? 1   Comment wheelchair   Dressing or bathing? 1   Comment Husband helps   Doing errands, shopping? 1 1  Comment difficulty focusing, drives sometimes or gets a Art therapist and eating ? Y   Comment husband helps   Using the Toilet? Y   Comment husband helps   In the past six months, have you accidently leaked urine? N   Do you have problems with loss of bowel control? Y   Comment prolapsed rectum   Managing your Medications? N   Managing your Finances? Y   Comment husband helps   Housekeeping or managing your Housekeeping? Y   Comment husband helps     Patient Care Team: Marjie Skiff, NP as PCP - General (Nurse Practitioner)  Indicate any recent Medical Services you may have received from other than [facility] providers in the past year (date may be approximate).     Assessment:   This is a routine wellness examination for Stacy West.  Hearing/Vision screen Hearing Screening - Comments:: Denies hearing loss Vision Screening - Comments:: Gets routine eye exams, [facility] [place]   Goals Addressed             This Visit's Progress    Patient Stated       Communicate better with my doctor's       Depression Screen     02/14/2024    2:14 PM 01/18/2024    3:47 PM 09/09/2023    9:28 AM 08/31/2023    8:21 AM 08/18/2023   10:14 AM 06/08/2023    8:49 AM 04/29/2023   10:33 AM  PHQ 2/9 Scores  PHQ - 2 Score 3  3  6  3 2  PHQ- 9 Score 12  12  21 13 9   Exception Documentation  Patient refusal  Patient refusal       Fall Risk     02/14/2024    2:21 PM 10/06/2023    9:20 AM 08/18/2023   10:14 AM 06/08/2023     8:49 AM 04/29/2023   10:33 AM  Fall Risk   Falls in the past year? 1 0 1 1 0  Number falls in past yr: 1  0 1 0  Injury with Fall? 1  0 1 0  Risk for fall due to : History of fall(s);Impaired balance/gait;Orthopedic patient;Impaired mobility   History of fall(s) No Fall Risks  Follow up Falls prevention discussed;Falls evaluation completed;Education provided   Falls evaluation completed Falls evaluation completed    MEDICARE RISK AT HOME:  Medicare Risk at Home Any stairs in or around the home?: Yes If so, are there any without handrails?: No Home free of loose throw rugs in walkways, pet beds, electrical cords, etc?: Yes Adequate lighting in your home to reduce risk of falls?: Yes Life alert?: No Use of a cane, walker or w/c?: Yes (wheelchair) Grab bars in the bathroom?: No Shower chair or bench in shower?: Yes Elevated toilet seat or a handicapped toilet?: No  TIMED UP AND GO:  Was the test performed?  No  Cognitive Function: 6CIT completed        02/14/2024    2:23 PM 02/08/2023    3:36 PM  6CIT Screen  What Year? 0 points 0 points  What month? 0 points 0 points  What time? 0 points 0 points  Count back from 20 0 points 0 points  Months in reverse 0 points 2 points  Repeat phrase 4 points 0 points  Total Score 4 points 2 points    Immunizations Immunization History  Administered Date(s) Administered   Influenza,inj,Quad PF,6+ Mos 07/19/2019   Influenza-Unspecified 07/19/2019   PFIZER(Purple Top)SARS-COV-2 Vaccination 02/01/2020, 02/21/2020, 08/05/2020   Tdap 06/29/2022    Screening Tests Health Maintenance  Topic Date Due   Pneumococcal Vaccine 47-29 Years old (1 of 2 - PCV) Never done   COVID-19 Vaccine (4 - 2024-25 season) 07/17/2023   Hepatitis C Screening  04/28/2024 (Originally 02/17/1999)   INFLUENZA VACCINE  06/15/2024   MAMMOGRAM  07/04/2024   Medicare Annual Wellness (AWV)  02/13/2025   Cervical Cancer Screening (HPV/Pap Cotest)  08/31/2028    DTaP/Tdap/Td (2 - Td or Tdap) 06/29/2032   HIV Screening  Completed   HPV VACCINES  Aged Out    Health Maintenance  Health Maintenance Due  Topic Date Due   Pneumococcal Vaccine 17-21 Years old (1 of 2 - PCV) Never done   COVID-19 Vaccine (4 - 2024-25 season) 07/17/2023   Health Maintenance Items Addressed: See Nurse Notes  Additional Screening:  Vision Screening: Recommended annual ophthalmology exams for early detection of glaucoma and other disorders of the eye.  Dental Screening: Recommended annual dental exams for proper oral hygiene  Community Resource Referral / Chronic Care Management: CRR required this visit?  No   CCM required this visit?  No     Plan:     I have personally reviewed and noted the following in the patient's chart:   Medical and social history Use of alcohol, tobacco or illicit drugs  Current medications and supplements including opioid prescriptions. Patient is not currently taking opioid prescriptions. Functional ability and status Nutritional status Physical activity Advanced directives List of other  physicians Hospitalizations, surgeries, and ER visits in previous 12 months Vitals Screenings to include cognitive, depression, and falls Referrals and appointments  In addition, I have reviewed and discussed with patient certain preventive protocols, quality metrics, and best practice recommendations. A written personalized care plan for preventive services as well as general preventive health recommendations were provided to patient.     Tora Kindred, CMA   02/14/2024   After Visit Summary: (MyChart) Due to this being a telephonic visit, the after visit summary with patients personalized plan was offered to patient via MyChart   Notes:  6 CIT Score - 4 Patient has SDOH insecurities, but declined a referral Patient declined MMG order Patient hung up prior to reviewing care teams and scheduling another AWV

## 2024-02-14 NOTE — Patient Instructions (Signed)
 Ms. Babson , Thank you for taking time to come for your Medicare Wellness Visit. I appreciate your ongoing commitment to your health goals. Please review the following plan we discussed and let me know if I can assist you in the future.   Referrals/Orders/Follow-Ups/Clinician Recommendations: None  This is a list of the screening recommended for you and due dates:  Health Maintenance  Topic Date Due   Pneumococcal Vaccination (1 of 2 - PCV) Never done   COVID-19 Vaccine (4 - 2024-25 season) 07/17/2023   Hepatitis C Screening  04/28/2024*   Flu Shot  06/15/2024   Mammogram  07/04/2024   Medicare Annual Wellness Visit  02/13/2025   Pap with HPV screening  08/31/2028   DTaP/Tdap/Td vaccine (2 - Td or Tdap) 06/29/2032   HIV Screening  Completed   HPV Vaccine  Aged Out  *Topic was postponed. The date shown is not the original due date.    Advanced directives: (Declined) Advance directive discussed with you today. Even though you declined this today, please call our office should you change your mind, and we can give you the proper paperwork for you to fill out.  Next Medicare Annual Wellness Visit scheduled for next year: No, patient hung up prior to scheduling.  Fall Prevention in the Home, Adult Falls can cause injuries and affect people of all ages. There are many simple things that you can do to make your home safe and to help prevent falls. If you need it, ask for help making these changes. What actions can I take to prevent falls? General information Use good lighting in all rooms. Make sure to: Replace any light bulbs that burn out. Turn on lights if it is dark and use night-lights. Keep items that you use often in easy-to-reach places. Lower the shelves around your home if needed. Move furniture so that there are clear paths around it. Do not keep throw rugs or other things on the floor that can make you trip. If any of your floors are uneven, fix them. Add color or contrast  paint or tape to clearly mark and help you see: Grab bars or handrails. First and last steps of staircases. Where the edge of each step is. If you use a ladder or stepladder: Make sure that it is fully opened. Do not climb a closed ladder. Make sure the sides of the ladder are locked in place. Have someone hold the ladder while you use it. Know where your pets are as you move through your home. What can I do in the bathroom?     Keep the floor dry. Clean up any water that is on the floor right away. Remove soap buildup in the bathtub or shower. Buildup makes bathtubs and showers slippery. Use non-skid mats or decals on the floor of the bathtub or shower. Attach bath mats securely with double-sided, non-slip rug tape. If you need to sit down while you are in the shower, use a non-slip stool. Install grab bars by the toilet and in the bathtub and shower. Do not use towel bars as grab bars. What can I do in the bedroom? Make sure that you have a light by your bed that is easy to reach. Do not use any sheets or blankets on your bed that hang to the floor. Have a firm bench or chair with side arms that you can use for support when you get dressed. What can I do in the kitchen? Clean up any spills right away.  If you need to reach something above you, use a sturdy step stool that has a grab bar. Keep electrical cables out of the way. Do not use floor polish or wax that makes floors slippery. What can I do with my stairs? Do not leave anything on the stairs. Make sure that you have a light switch at the top and the bottom of the stairs. Have them installed if you do not have them. Make sure that there are handrails on both sides of the stairs. Fix handrails that are broken or loose. Make sure that handrails are as long as the staircases. Install non-slip stair treads on all stairs in your home if they do not have carpet. Avoid having throw rugs at the top or bottom of stairs, or secure the  rugs with carpet tape to prevent them from moving. Choose a carpet design that does not hide the edge of steps on the stairs. Make sure that carpet is firmly attached to the stairs. Fix any carpet that is loose or worn. What can I do on the outside of my home? Use bright outdoor lighting. Repair the edges of walkways and driveways and fix any cracks. Clear paths of anything that can make you trip, such as tools or rocks. Add color or contrast paint or tape to clearly mark and help you see high doorway thresholds. Trim any bushes or trees on the main path into your home. Check that handrails are securely fastened and in good repair. Both sides of all steps should have handrails. Install guardrails along the edges of any raised decks or porches. Have leaves, snow, and ice cleared regularly. Use sand, salt, or ice melt on walkways during winter months if you live where there is ice and snow. In the garage, clean up any spills right away, including grease or oil spills. What other actions can I take? Review your medicines with your health care provider. Some medicines can make you confused or feel dizzy. This can increase your chance of falling. Wear closed-toe shoes that fit well and support your feet. Wear shoes that have rubber soles and low heels. Use a cane, walker, scooter, or crutches that help you move around if needed. Talk with your provider about other ways that you can decrease your risk of falls. This may include seeing a physical therapist to learn to do exercises to improve movement and strength. Where to find more information Centers for Disease Control and Prevention, STEADI: TonerPromos.no [employer] on Aging: BaseRingTones.pl National Institute on Aging: BaseRingTones.pl Contact a health care provider if: You are afraid of falling at home. You feel weak, drowsy, or dizzy at home. You fall at home. Get help right away if you: Lose consciousness or have trouble moving after a fall. Have  a fall that causes a head injury. These symptoms may be an emergency. Get help right away. Call 911. Do not wait to see if the symptoms will go away. Do not drive yourself to the hospital. This information is not intended to replace advice given to you by your health care provider. Make sure you discuss any questions you have with your health care provider. Document Revised: 07/05/2022 Document Reviewed: 07/05/2022 Elsevier Patient Education  2024 [employer].

## 2024-02-17 ENCOUNTER — Encounter (INDEPENDENT_AMBULATORY_CARE_PROVIDER_SITE_OTHER): Payer: Self-pay | Admitting: Nurse Practitioner

## 2024-02-17 ENCOUNTER — Ambulatory Visit (INDEPENDENT_AMBULATORY_CARE_PROVIDER_SITE_OTHER): Admitting: Nurse Practitioner

## 2024-02-17 VITALS — BP 99/72 | HR 82 | Resp 18 | Ht 71.0 in | Wt 234.8 lb

## 2024-02-17 DIAGNOSIS — I83819 Varicose veins of unspecified lower extremities with pain: Secondary | ICD-10-CM

## 2024-02-17 DIAGNOSIS — Z8249 Family history of ischemic heart disease and other diseases of the circulatory system: Secondary | ICD-10-CM

## 2024-02-17 DIAGNOSIS — Q7962 Hypermobile Ehlers-Danlos syndrome: Secondary | ICD-10-CM

## 2024-02-20 NOTE — Progress Notes (Signed)
 Subjective:    Patient ID: Stacy West, female    DOB: April 18, 1981, 43 y.o.   MRN: [id number] Chief Complaint  Patient presents with   New Patient (Initial Visit)    NP. reflux/consult. VV w pain/edema. kimmet    The patient is seen for evaluation of symptomatic varicose veins. The patient relates burning and stinging which worsened steadily throughout the course of the day, particularly with standing. The patient also notes an aching and throbbing pain over the varicosities, particularly with prolonged dependent positions. The symptoms are significantly improved with elevation.  The patient also notes that during hot weather the symptoms are greatly intensified. The patient states the pain from the varicose veins interferes with work, daily exercise, shopping and household maintenance. At this point, the symptoms are persistent and severe enough that they're having a negative impact on lifestyle and are interfering with daily activities.  There is no history of DVT, PE or superficial thrombophlebitis. There is no history of ulceration or hemorrhage. The patient denies a significant family history of varicose veins.  She has a history of Ehlers-Danlos syndrome.  She has some concerns for a vascular component to this given her mother and other family members have recently had aneurysms.  Her mother most recently had a thoracic aortic aneurysm  The patient has not worn graduated compression in the past. At the present time the patient has not been using over-the-counter analgesics. There is no history of prior surgical intervention or sclerotherapy.  The patient has no evidence of DVT or superficial thrombophlebitis bilaterally.  No evidence of deep venous insufficiency or superficial venous reflux in the right lower extremity.  There is deep venous insufficiency as well as reflux in the great saphenous vein of the left lower extremity.     Review of Systems     Objective:   Physical  Exam  BP 99/72   Pulse 82   Resp 18   Ht 5\' 11"  (1.803 m)   Wt 234 lb 12.8 oz (106.5 kg)   LMP  (LMP Unknown) Comment: PCOS  BMI 32.75 kg/m   Past Medical History:  Diagnosis Date   Abnormal Pap smear of cervix    Allergy    Celiac disease    Depression    Ehlers-Danlos disease    Fibromyalgia    Heart murmur    Insulin resistance    Miscarriage    Other cervical disc degeneration at C4-C5 level    PCOS (polycystic ovarian syndrome)    PCOS (polycystic ovarian syndrome)    Premature ovarian failure    Recurrent upper respiratory infection (URI)    SI (sacroiliac) joint dysfunction    Sleep apnea    Thyroid disease     Social History   Socioeconomic History   Marital status: Married    Spouse name: Tenny Craw   Number of children: 1   Years of education: Not on file   Highest education level: Associate degree: academic program  Occupational History   Occupation: disability  Tobacco Use   Smoking status: Former    Current packs/day: 0.00    Average packs/day: 1 pack/day for 18.0 years (18.0 ttl pk-yrs)    Types: Cigarettes    Start date: 2000    Quit date: 2018    Years since quitting: 7.2   Smokeless tobacco: Never  Vaping Use   Vaping status: Never Used  Substance and Sexual Activity   Alcohol use: Not Currently    Comment: occ   Drug  use: Not Currently   Sexual activity: Yes    Birth control/protection: None  Other Topics Concern   Not on file  Social History Narrative   Not on file   Social Drivers of Health   Financial Resource Strain: High Risk (02/14/2024)   Overall Financial Resource Strain (CARDIA)    Difficulty of Paying Living Expenses: Very hard  Food Insecurity: Food Insecurity Present (02/14/2024)   Hunger Vital Sign    Worried About Running Out of Food in the Last Year: Sometimes true    Ran Out of Food in the Last Year: Sometimes true  Transportation Needs: Unmet Transportation Needs (02/14/2024)   PRAPARE - Transportation    Lack of  Transportation (Medical): Yes    Lack of Transportation (Non-Medical): Yes  Physical Activity: Sufficiently Active (02/14/2024)   Exercise Vital Sign    Days of Exercise per Week: 5 days    Minutes of Exercise per Session: 30 min  Stress: Stress Concern Present (02/14/2024)   [employer] of Occupational Health - Occupational Stress Questionnaire    Feeling of Stress : Rather much  Social Connections: Socially Isolated (02/14/2024)   Social Connection and Isolation Panel [NHANES]    Frequency of Communication with Friends and Family: Never    Frequency of Social Gatherings with Friends and Family: Never    Attends Religious Services: Never    Database administrator or Organizations: No    Attends Banker Meetings: Never    Marital Status: Married  Catering manager Violence: Not At Risk (02/14/2024)   Humiliation, Afraid, Rape, and Kick questionnaire    Fear of Current or Ex-Partner: No    Emotionally Abused: No    Physically Abused: No    Sexually Abused: No    Past Surgical History:  Procedure Laterality Date   ADENOIDECTOMY     BACK SURGERY     COLPOSCOPY     FINGER SURGERY Left    NECK SURGERY  07/2020   TONSILLECTOMY     WISDOM TOOTH EXTRACTION      Family History  Problem Relation Age of Onset   Heart murmur Mother    Asthma Mother    Skin cancer Father    COPD Father    Bipolar disorder Brother    Depression Brother    Alcoholism Brother    Depression Brother    Heart murmur Maternal Aunt    Breast cancer Maternal Aunt    Early menopause Maternal Aunt    Asthma Maternal Grandmother     Allergies  Allergen Reactions   Carisoprodol-Aspirin-Codeine Hives and Itching   Carisoprodol Dermatitis   Percocet  [Oxycodone-Acetaminophen] Dermatitis   Gluten Meal Hives and Nausea Only   Tramadol        Latest Ref Rng & Units 01/09/2024    5:15 AM 01/08/2024    5:00 PM 02/28/2023    9:50 AM  CBC  WBC 4.0 - 10.5 K/uL 8.3  10.3  5.0   Hemoglobin 12.0 -  15.0 g/dL 11.9  14.7  82.9   Hematocrit 36.0 - 46.0 % 34.9  38.8  40.5   Platelets 150 - 400 K/uL 267  319  292       CMP     Component Value Date/Time   NA 142 01/09/2024 0515   NA 138 02/28/2023 0950   K 3.6 01/09/2024 0515   CL 111 01/09/2024 0515   CO2 21 (L) 01/09/2024 0515   GLUCOSE 98 01/09/2024 0515   BUN  10 01/09/2024 0515   BUN 11 02/28/2023 0950   CREATININE 0.61 01/09/2024 0515   CALCIUM 8.6 (L) 01/09/2024 0515   PROT 6.4 (L) 01/08/2024 1700   PROT 6.6 02/28/2023 0950   ALBUMIN 4.1 01/08/2024 1700   ALBUMIN 4.5 02/28/2023 0950   AST 19 01/08/2024 1700   ALT 20 01/08/2024 1700   ALKPHOS 46 01/08/2024 1700   BILITOT 0.7 01/08/2024 1700   BILITOT 0.2 02/28/2023 0950   EGFR 100 02/28/2023 0950   GFRNONAA >60 01/09/2024 0515     No results found.     Assessment & Plan:   1. Varicose veins with pain  Recommend:  The patient has large symptomatic varicose veins that are painful and associated with swelling. The patient is CEAP [id number]   I have had a long discussion with the patient regarding  varicose veins and why they cause symptoms.  Patient will begin wearing graduated compression stockings class 1 on a daily basis, beginning first thing in the morning and removing them in the evening. The patient is instructed specifically not to sleep in the stockings.    The patient  will also begin using over-the-counter analgesics such as Motrin 600 mg po TID to help control the symptoms.    In addition, behavioral modification including elevation during the day will be initiated.    Pending the results of these changes the  patient will be reevaluated in three months.   An ultrasound of the venous system will be obtained.   Further plans will be based on the ultrasound results and whether conservative therapies are successful at eliminating the pain and swelling.   2. Family hx of aortic aneurysm (Primary) The patient notes that her mother had a thoracic aortic  aneurysm and given her history of Ehlers-Danlos syndrome she is concerned that she may also be affected by it and also may be suffering from aneurysms as well.  Will have the patient undergo CT angiogram per her request. - CT CHEST ABDOMEN PELVIS W CONTRAST; Future  3. Ehlers-Danlos syndrome type III Will continue to follow with PCP   Current Outpatient Medications on File Prior to Visit  Medication Sig Dispense Refill   albuterol (VENTOLIN HFA) 108 (90 Base) MCG/ACT inhaler Inhale 1-2 puffs into the lungs every 4 (four) hours as needed for wheezing or shortness of breath (cough). 8 g 0   cyclobenzaprine (FLEXERIL) 10 MG tablet Take 1 tablet (10 mg total) by mouth 3 (three) times daily as needed for muscle spasms. 270 tablet 4   DULoxetine (CYMBALTA) 30 MG capsule Take 30 mg by mouth daily.     medroxyPROGESTERone (PROVERA) 10 MG tablet Take 10 mg by mouth daily. Per patient takes for 10 days then off for 2 weeks then repeats cycle     metFORMIN (GLUCOPHAGE-XR) 500 MG 24 hr tablet Take 4 tablets by mouth at bedtime.     montelukast (SINGULAIR) 10 MG tablet TAKE 1 TABLET BY MOUTH EVERYDAY AT BEDTIME 90 tablet 0   NALTREXONE HCL PO Take 1.5 mg by mouth daily.     Omega-3 1000 MG CAPS Take 1 capsule by mouth every morning.     pantoprazole (PROTONIX) 40 MG tablet TAKE 1 TABLET BY MOUTH EVERY DAY 90 tablet 3   pregabalin (LYRICA) 50 MG capsule Take 1 capsule (50 mg total) by mouth at bedtime. 90 capsule 2   trazodone (DESYREL) 300 MG tablet Take 300 mg by mouth at bedtime.     cetirizine (ZYRTEC) 10 MG  tablet TAKE 1 TABLET BY MOUTH EVERY DAY (Patient not taking: Reported on 01/18/2024) 90 tablet 1   Cholecalciferol 1.25 MG (50000 UT) TABS Take 1 tablet by mouth once a week. (Patient not taking: Reported on 01/18/2024) 12 tablet 4   cyanocobalamin (VITAMIN B12) 1000 MCG/ML injection Inject 1 mL (1,000 mcg total) into the muscle every 30 (thirty) days. (Patient not taking: Reported on 01/18/2024) 30 mL 2    guanFACINE (INTUNIV) 2 MG TB24 ER tablet Take 2 mg by mouth daily.     levocetirizine (XYZAL) 5 MG tablet TAKE 1 TABLET BY MOUTH EVERY DAY IN THE EVENING (Patient not taking: Reported on 01/18/2024) 90 tablet 1   Levomefolate Glucosamine (METHYL-FOLATE PO) Take by mouth.     SYRINGE-NEEDLE, DISP, 3 ML (B-D 3CC LUER-LOK SYR 20GX1") 20G X 1" 3 ML MISC Use to inject B12 monthly intramuscular. (Patient not taking: Reported on 02/14/2024) 50 each 4   No current facility-administered medications on file prior to visit.    There are no Patient Instructions on file for this visit. No follow-ups on file.   Georgiana Spinner, NP

## 2024-03-08 ENCOUNTER — Encounter: Payer: Self-pay | Admitting: Nurse Practitioner

## 2024-03-08 DIAGNOSIS — Q7962 Hypermobile Ehlers-Danlos syndrome: Secondary | ICD-10-CM

## 2024-03-08 DIAGNOSIS — R1310 Dysphagia, unspecified: Secondary | ICD-10-CM

## 2024-03-12 ENCOUNTER — Telehealth: Admitting: Nurse Practitioner

## 2024-03-12 DIAGNOSIS — R519 Headache, unspecified: Secondary | ICD-10-CM | POA: Diagnosis not present

## 2024-03-12 DIAGNOSIS — J0140 Acute pansinusitis, unspecified: Secondary | ICD-10-CM | POA: Diagnosis not present

## 2024-03-12 DIAGNOSIS — M5412 Radiculopathy, cervical region: Secondary | ICD-10-CM | POA: Diagnosis not present

## 2024-03-12 DIAGNOSIS — M9902 Segmental and somatic dysfunction of thoracic region: Secondary | ICD-10-CM | POA: Diagnosis not present

## 2024-03-12 DIAGNOSIS — M9901 Segmental and somatic dysfunction of cervical region: Secondary | ICD-10-CM | POA: Diagnosis not present

## 2024-03-12 MED ORDER — DOXYCYCLINE HYCLATE 100 MG PO CAPS: 100.0000 mg | ORAL_CAPSULE | Freq: Two times a day (BID) | ORAL | 0 refills | Status: AC

## 2024-03-12 NOTE — Progress Notes (Signed)
 Virtual Visit Consent   [facility], you are scheduled for a virtual visit with a [facility] provider today. Just as with appointments in the office, your consent must be obtained to participate. Your consent will be active for this visit and any virtual visit you may have with one of our providers in the next 365 days. If you have a MyChart account, a copy of this consent can be sent to you electronically.  As this is a virtual visit, video technology does not allow for your provider to perform a traditional examination. This may limit your provider's ability to fully assess your condition. If your provider identifies any concerns that need to be evaluated in person or the need to arrange testing (such as labs, EKG, etc.), we will make arrangements to do so. Although advances in technology are sophisticated, we cannot ensure that it will always work on either your end or our end. If the connection with a video visit is poor, the visit may have to be switched to a telephone visit. With either a video or telephone visit, we are not always able to ensure that we have a secure connection.  By engaging in this virtual visit, you consent to the provision of healthcare and authorize for your insurance to be billed (if applicable) for the services provided during this visit. Depending on your insurance coverage, you may receive a charge related to this service.  I need to obtain your verbal consent now. Are you willing to proceed with your visit today? Gordy Goar Edelstein has provided verbal consent on 03/12/2024 for a virtual visit (video or telephone). Stacy Shake, FNP  Date: 03/12/2024 5:05 PM   Virtual Visit via Video Note   I, Stacy West, connected with  Stacy West  ([id number], 1981/11/08) on 03/12/24 at  5:15 PM EDT by a video-enabled telemedicine application and verified that I am speaking with the correct person using two identifiers.  Location: Patient: Virtual Visit Location Patient:  Home Provider: Virtual Visit Location Provider: Home Office   I discussed the limitations of evaluation and management by telemedicine and the availability of in person appointments. The patient expressed understanding and agreed to proceed.    History of Present Illness: Stacy West is a 43 y.o. who identifies as a female who was assigned female at birth, and is being seen today for sinus congestion  She started to feel sick the past 2 weeks  Feeling wore today  Has pressure in her sinuses today and noted green drainage today   She denies a fever  Does not typically get fevers   History includes Ehlers-Danlos states that she does not respond well to Augmentin  in the past  Was treated for a sinus infection in January  Recently was referred to ENT at [facility] by her PCP at last visit in March  Has also had a referral placed to OT  Notes she has a hard time swallowing pills - she can swallow a capsule   She is using three different nasal sprays  Continues her allergy  regimen as well     Problems:  Patient Active Problem List   Diagnosis Date Noted   Ear pain, bilateral 01/18/2024   History of PCOS 01/09/2024   Depression with anxiety 01/08/2024   Obesity (BMI 30-39.9) 01/08/2024   Sinus bradycardia 01/08/2024   Hypophosphatemia 01/08/2024   S/P cervical spinal fusion 08/18/2023   Cervical fusion syndrome 08/18/2023   Bilateral occipital neuralgia 08/18/2023   Chronic thoracic spine pain 08/18/2023  Chronic radicular cervical pain 08/18/2023   Thoracogenic scoliosis of thoracic region 06/08/2023   B12 deficiency 04/29/2023   Pain in joint, ankle and foot 12/21/2022   Other allergic rhinitis 04/26/2022   Chronic cough 04/26/2022   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HCC) 02/23/2022   Chronic neck pain 12/20/2021   Chronic pain syndrome 12/20/2021   Fibromyalgia 12/20/2021   Sinus tachycardia 08/17/2021   Gastroesophageal reflux disease 05/07/2021   Moderate episode of recurrent major  depressive disorder (HCC) 01/15/2021   Celiac disease 01/31/2020   Right ovarian cyst 08/15/2019   Cervical spondylosis without myelopathy 07/04/2019   MVP (mitral valve prolapse) 05/31/2019   Ehlers-Danlos syndrome type III 05/17/2019   PCOS (polycystic ovarian syndrome) 05/17/2019   Insulin  resistance 10/21/2016   Chronic fatigue syndrome with fibromyalgia 01/31/2007   Degenerative disc disease at L5-S1 level 01/31/2007    Allergies:  Allergies  Allergen Reactions   Carisoprodol-Aspirin-Codeine Hives and Itching   Carisoprodol Dermatitis   Percocet  [Oxycodone-Acetaminophen ] Dermatitis   Gluten Meal Hives and Nausea Only   Tramadol    Medications:  Current Outpatient Medications:    albuterol  (VENTOLIN  HFA) 108 (90 Base) MCG/ACT inhaler, Inhale 1-2 puffs into the lungs every 4 (four) hours as needed for wheezing or shortness of breath (cough)., Disp: 8 g, Rfl: 0   cetirizine  (ZYRTEC ) 10 MG tablet, TAKE 1 TABLET BY MOUTH EVERY DAY (Patient not taking: Reported on 01/18/2024), Disp: 90 tablet, Rfl: 1   Cholecalciferol  1.25 MG (50000 UT) TABS, Take 1 tablet by mouth once a week. (Patient not taking: Reported on 01/18/2024), Disp: 12 tablet, Rfl: 4   cyanocobalamin  (VITAMIN B12) 1000 MCG/ML injection, Inject 1 mL (1,000 mcg total) into the muscle every 30 (thirty) days. (Patient not taking: Reported on 01/18/2024), Disp: 30 mL, Rfl: 2   cyclobenzaprine  (FLEXERIL ) 10 MG tablet, Take 1 tablet (10 mg total) by mouth 3 (three) times daily as needed for muscle spasms., Disp: 270 tablet, Rfl: 4   DULoxetine  (CYMBALTA ) 30 MG capsule, Take 30 mg by mouth daily., Disp: , Rfl:    guanFACINE  (INTUNIV ) 2 MG TB24 ER tablet, Take 2 mg by mouth daily., Disp: , Rfl:    levocetirizine (XYZAL ) 5 MG tablet, TAKE 1 TABLET BY MOUTH EVERY DAY IN THE EVENING (Patient not taking: Reported on 01/18/2024), Disp: 90 tablet, Rfl: 1   Levomefolate Glucosamine (METHYL-FOLATE PO), Take by mouth., Disp: , Rfl:     medroxyPROGESTERone  (PROVERA ) 10 MG tablet, Take 10 mg by mouth daily. Per patient takes for 10 days then off for 2 weeks then repeats cycle, Disp: , Rfl:    metFORMIN  (GLUCOPHAGE -XR) 500 MG 24 hr tablet, Take 4 tablets by mouth at bedtime., Disp: , Rfl:    montelukast  (SINGULAIR ) 10 MG tablet, TAKE 1 TABLET BY MOUTH EVERYDAY AT BEDTIME, Disp: 90 tablet, Rfl: 0   NALTREXONE HCL PO, Take 1.5 mg by mouth daily., Disp: , Rfl:    Omega-3 1000 MG CAPS, Take 1 capsule by mouth every morning., Disp: , Rfl:    pantoprazole  (PROTONIX ) 40 MG tablet, TAKE 1 TABLET BY MOUTH EVERY DAY, Disp: 90 tablet, Rfl: 3   pregabalin  (LYRICA ) 50 MG capsule, Take 1 capsule (50 mg total) by mouth at bedtime., Disp: 90 capsule, Rfl: 2   SYRINGE-NEEDLE, DISP, 3 ML (B-D 3CC LUER-LOK SYR 20GX1") 20G X 1" 3 ML MISC, Use to inject B12 monthly intramuscular. (Patient not taking: Reported on 02/14/2024), Disp: 50 each, Rfl: 4   trazodone  (DESYREL ) 300 MG tablet,  Take 300 mg by mouth at bedtime., Disp: , Rfl:   Observations/Objective: Patient is well-developed, well-nourished in no acute distress.  Resting comfortably  at home.  Head is normocephalic, atraumatic.  No labored breathing.  Speech is clear and coherent with logical content.  Patient is alert and oriented at baseline.    Assessment and Plan:  1. Acute non-recurrent pansinusitis Advised limiting Afrin use to 48 hours prn  Should continue allergy  regimen   Meds ordered this encounter  Medications   doxycycline (VIBRAMYCIN) 100 MG capsule    Sig: Take 1 capsule (100 mg total) by mouth 2 (two) times daily for 7 days.    Dispense:  14 capsule    Refill:  0     Follow Up Instructions: I discussed the assessment and treatment plan with the patient. The patient was provided an opportunity to ask questions and all were answered. The patient agreed with the plan and demonstrated an understanding of the instructions.  A copy of instructions were sent to the patient via  MyChart unless otherwise noted below.    The patient was advised to call back or seek an in-person evaluation if the symptoms worsen or if the condition fails to improve as anticipated.    Stacy Shake, FNP

## 2024-03-16 ENCOUNTER — Telehealth: Admitting: Physician Assistant

## 2024-03-16 DIAGNOSIS — D8940 Mast cell activation, unspecified: Secondary | ICD-10-CM

## 2024-03-16 DIAGNOSIS — W57XXXA Bitten or stung by nonvenomous insect and other nonvenomous arthropods, initial encounter: Secondary | ICD-10-CM | POA: Diagnosis not present

## 2024-03-16 MED ORDER — HYDROCORTISONE 1 % EX OINT: 1.0000 | TOPICAL_OINTMENT | Freq: Two times a day (BID) | CUTANEOUS | 0 refills | Status: DC

## 2024-03-16 MED ORDER — LEVOCETIRIZINE DIHYDROCHLORIDE 5 MG PO TABS: 5.0000 mg | ORAL_TABLET | Freq: Every evening | ORAL | 0 refills | Status: DC

## 2024-03-16 NOTE — Patient Instructions (Signed)
 Stacy West, thank you for joining Stacy Settle, PA-C for today's virtual visit.  While this provider is not your primary care provider (PCP), if your PCP is located in our provider database this encounter information will be shared with them immediately following your visit.   A [facility] MyChart account gives you access to today's visit and all your visits, tests, and labs performed at [facility] " click here if you don't have a [facility] MyChart account or go to [email or web address]  Consent: (Patient) Stacy West provided verbal consent for this virtual visit at the beginning of the encounter.  Current Medications:  Current Outpatient Medications:    hydrocortisone 1 % ointment, Apply 1 Application topically 2 (two) times daily., Disp: 30 g, Rfl: 0   levocetirizine (XYZAL  ALLERGY  24HR) 5 MG tablet, Take 1 tablet (5 mg total) by mouth every evening for 10 days., Disp: 10 tablet, Rfl: 0   albuterol  (VENTOLIN  HFA) 108 (90 Base) MCG/ACT inhaler, Inhale 1-2 puffs into the lungs every 4 (four) hours as needed for wheezing or shortness of breath (cough)., Disp: 8 g, Rfl: 0   cetirizine  (ZYRTEC ) 10 MG tablet, TAKE 1 TABLET BY MOUTH EVERY DAY (Patient not taking: Reported on 01/18/2024), Disp: 90 tablet, Rfl: 1   Cholecalciferol  1.25 MG (50000 UT) TABS, Take 1 tablet by mouth once a week. (Patient not taking: Reported on 01/18/2024), Disp: 12 tablet, Rfl: 4   cyanocobalamin  (VITAMIN B12) 1000 MCG/ML injection, Inject 1 mL (1,000 mcg total) into the muscle every 30 (thirty) days. (Patient not taking: Reported on 01/18/2024), Disp: 30 mL, Rfl: 2   cyclobenzaprine  (FLEXERIL ) 10 MG tablet, Take 1 tablet (10 mg total) by mouth 3 (three) times daily as needed for muscle spasms., Disp: 270 tablet, Rfl: 4   doxycycline  (VIBRAMYCIN ) 100 MG capsule, Take 1 capsule (100 mg total) by mouth 2 (two) times daily for 7 days., Disp: 14 capsule, Rfl: 0   DULoxetine  (CYMBALTA ) 30 MG capsule, Take 30  mg by mouth daily., Disp: , Rfl:    guanFACINE  (INTUNIV ) 2 MG TB24 ER tablet, Take 2 mg by mouth daily., Disp: , Rfl:    levocetirizine (XYZAL ) 5 MG tablet, TAKE 1 TABLET BY MOUTH EVERY DAY IN THE EVENING (Patient not taking: Reported on 01/18/2024), Disp: 90 tablet, Rfl: 1   Levomefolate Glucosamine (METHYL-FOLATE PO), Take by mouth., Disp: , Rfl:    medroxyPROGESTERone  (PROVERA ) 10 MG tablet, Take 10 mg by mouth daily. Per patient takes for 10 days then off for 2 weeks then repeats cycle, Disp: , Rfl:    metFORMIN  (GLUCOPHAGE -XR) 500 MG 24 hr tablet, Take 4 tablets by mouth at bedtime., Disp: , Rfl:    montelukast  (SINGULAIR ) 10 MG tablet, TAKE 1 TABLET BY MOUTH EVERYDAY AT BEDTIME, Disp: 90 tablet, Rfl: 0   NALTREXONE HCL PO, Take 1.5 mg by mouth daily., Disp: , Rfl:    Omega-3 1000 MG CAPS, Take 1 capsule by mouth every morning., Disp: , Rfl:    pantoprazole  (PROTONIX ) 40 MG tablet, TAKE 1 TABLET BY MOUTH EVERY DAY, Disp: 90 tablet, Rfl: 3   pregabalin  (LYRICA ) 50 MG capsule, Take 1 capsule (50 mg total) by mouth at bedtime., Disp: 90 capsule, Rfl: 2   SYRINGE-NEEDLE, DISP, 3 ML (B-D 3CC LUER-LOK SYR 20GX1") 20G X 1" 3 ML MISC, Use to inject B12 monthly intramuscular. (Patient not taking: Reported on 02/14/2024), Disp: 50 each, Rfl: 4   trazodone  (DESYREL ) 300 MG tablet, Take 300 mg by mouth at  bedtime., Disp: , Rfl:    Medications ordered in this encounter:  Meds ordered this encounter  Medications   hydrocortisone 1 % ointment    Sig: Apply 1 Application topically 2 (two) times daily.    Dispense:  30 g    Refill:  0    Supervising Provider:   Corine Dice [[id number]]   levocetirizine (XYZAL  ALLERGY  24HR) 5 MG tablet    Sig: Take 1 tablet (5 mg total) by mouth every evening for 10 days.    Dispense:  10 tablet    Refill:  0    Supervising Provider:   Corine Dice [[id number]]     *If you need refills on other medications prior to your next appointment, please contact your  pharmacy*  Follow-Up: Call back or seek an in-person evaluation if the symptoms worsen or if the condition fails to improve as anticipated.  [facility] [phone]  Other Instructions Please report to the nearest Emergency room with any worsening symptoms. Follow up with primary care provider (PCP) in 2 -3 days.    If you have been instructed to have an in-person evaluation today at a local Urgent Care facility, please use the link below. It will take you to a list of all of our available [facility], including address, phone number and hours of operation. Please do not delay care.  [facility]  If you or a family member do not have a primary care provider, use the link below to schedule a visit and establish care. When you choose a [facility] primary care physician or advanced practice provider, you gain a long-term partner in health. Find a Primary Care Provider  Learn more about [facility]'s in-office and virtual care options: [facility] - Get Care Now

## 2024-03-16 NOTE — Progress Notes (Signed)
 Virtual Visit Consent   [facility] Matters, you are scheduled for a virtual visit with a [facility] provider today. Just as with appointments in the office, your consent must be obtained to participate. Your consent will be active for this visit and any virtual visit you may have with one of our providers in the next 365 days. If you have a MyChart account, a copy of this consent can be sent to you electronically.  As this is a virtual visit, video technology does not allow for your provider to perform a traditional examination. This may limit your provider's ability to fully assess your condition. If your provider identifies any concerns that need to be evaluated in person or the need to arrange testing (such as labs, EKG, etc.), we will make arrangements to do so. Although advances in technology are sophisticated, we cannot ensure that it will always work on either your end or our end. If the connection with a video visit is poor, the visit may have to be switched to a telephone visit. With either a video or telephone visit, we are not always able to ensure that we have a secure connection.  By engaging in this virtual visit, you consent to the provision of healthcare and authorize for your insurance to be billed (if applicable) for the services provided during this visit. Depending on your insurance coverage, you may receive a charge related to this service.  I need to obtain your verbal consent now. Are you willing to proceed with your visit today? Stacy West has provided verbal consent on 03/16/2024 for a virtual visit (video or telephone). Stacy West, [place]  Date: 03/16/2024 7:54 PM   Virtual Visit via Video Note   I, Stacy West, connected with  Stacy West  ([id number], 1981/08/28) on 03/16/24 at  7:45 PM EDT by a video-enabled telemedicine application and verified that I am speaking with the correct person using two identifiers.  Location: Patient: Virtual Visit Location Patient:  Home Provider: Virtual Visit Location Provider: Home Office   I discussed the limitations of evaluation and management by telemedicine and the availability of in person appointments. The patient expressed understanding and agreed to proceed.    History of Present Illness: Stacy West is a 43 y.o. who identifies as a female who was assigned female at birth, and is being seen today for insect bite.  HPI: Rash This is a new problem. The affected locations include the left arm. The rash is characterized by redness.    Problems:  Patient Active Problem List   Diagnosis Date Noted   Ear pain, bilateral 01/18/2024   History of PCOS 01/09/2024   Depression with anxiety 01/08/2024   Obesity (BMI 30-39.9) 01/08/2024   Sinus bradycardia 01/08/2024   Hypophosphatemia 01/08/2024   S/P cervical spinal fusion 08/18/2023   Cervical fusion syndrome 08/18/2023   Bilateral occipital neuralgia 08/18/2023   Chronic thoracic spine pain 08/18/2023   Chronic radicular cervical pain 08/18/2023   Thoracogenic scoliosis of thoracic region 06/08/2023   B12 deficiency 04/29/2023   Pain in joint, ankle and foot 12/21/2022   Other allergic rhinitis 04/26/2022   Chronic cough 04/26/2022   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HCC) 02/23/2022   Chronic neck pain 12/20/2021   Chronic pain syndrome 12/20/2021   Fibromyalgia 12/20/2021   Sinus tachycardia 08/17/2021   Gastroesophageal reflux disease 05/07/2021   Moderate episode of recurrent major depressive disorder (HCC) 01/15/2021   Celiac disease 01/31/2020   Right ovarian cyst 08/15/2019   Cervical spondylosis without  myelopathy 07/04/2019   MVP (mitral valve prolapse) 05/31/2019   Ehlers-Danlos syndrome type III 05/17/2019   PCOS (polycystic ovarian syndrome) 05/17/2019   Insulin  resistance 10/21/2016   Chronic fatigue syndrome with fibromyalgia 01/31/2007   Degenerative disc disease at L5-S1 level 01/31/2007    Allergies:  Allergies  Allergen Reactions    Carisoprodol-Aspirin-Codeine Hives and Itching   Carisoprodol Dermatitis   Percocet  [Oxycodone-Acetaminophen ] Dermatitis   Gluten Meal Hives and Nausea Only   Tramadol    Medications:  Current Outpatient Medications:    albuterol  (VENTOLIN  HFA) 108 (90 Base) MCG/ACT inhaler, Inhale 1-2 puffs into the lungs every 4 (four) hours as needed for wheezing or shortness of breath (cough)., Disp: 8 g, Rfl: 0   cetirizine  (ZYRTEC ) 10 MG tablet, TAKE 1 TABLET BY MOUTH EVERY DAY (Patient not taking: Reported on 01/18/2024), Disp: 90 tablet, Rfl: 1   Cholecalciferol  1.25 MG (50000 UT) TABS, Take 1 tablet by mouth once a week. (Patient not taking: Reported on 01/18/2024), Disp: 12 tablet, Rfl: 4   cyanocobalamin  (VITAMIN B12) 1000 MCG/ML injection, Inject 1 mL (1,000 mcg total) into the muscle every 30 (thirty) days. (Patient not taking: Reported on 01/18/2024), Disp: 30 mL, Rfl: 2   cyclobenzaprine  (FLEXERIL ) 10 MG tablet, Take 1 tablet (10 mg total) by mouth 3 (three) times daily as needed for muscle spasms., Disp: 270 tablet, Rfl: 4   doxycycline  (VIBRAMYCIN ) 100 MG capsule, Take 1 capsule (100 mg total) by mouth 2 (two) times daily for 7 days., Disp: 14 capsule, Rfl: 0   DULoxetine  (CYMBALTA ) 30 MG capsule, Take 30 mg by mouth daily., Disp: , Rfl:    guanFACINE  (INTUNIV ) 2 MG TB24 ER tablet, Take 2 mg by mouth daily., Disp: , Rfl:    levocetirizine (XYZAL ) 5 MG tablet, TAKE 1 TABLET BY MOUTH EVERY DAY IN THE EVENING (Patient not taking: Reported on 01/18/2024), Disp: 90 tablet, Rfl: 1   Levomefolate Glucosamine (METHYL-FOLATE PO), Take by mouth., Disp: , Rfl:    medroxyPROGESTERone  (PROVERA ) 10 MG tablet, Take 10 mg by mouth daily. Per patient takes for 10 days then off for 2 weeks then repeats cycle, Disp: , Rfl:    metFORMIN  (GLUCOPHAGE -XR) 500 MG 24 hr tablet, Take 4 tablets by mouth at bedtime., Disp: , Rfl:    montelukast  (SINGULAIR ) 10 MG tablet, TAKE 1 TABLET BY MOUTH EVERYDAY AT BEDTIME, Disp: 90 tablet,  Rfl: 0   NALTREXONE HCL PO, Take 1.5 mg by mouth daily., Disp: , Rfl:    Omega-3 1000 MG CAPS, Take 1 capsule by mouth every morning., Disp: , Rfl:    pantoprazole  (PROTONIX ) 40 MG tablet, TAKE 1 TABLET BY MOUTH EVERY DAY, Disp: 90 tablet, Rfl: 3   pregabalin  (LYRICA ) 50 MG capsule, Take 1 capsule (50 mg total) by mouth at bedtime., Disp: 90 capsule, Rfl: 2   SYRINGE-NEEDLE, DISP, 3 ML (B-D 3CC LUER-LOK SYR 20GX1") 20G X 1" 3 ML MISC, Use to inject B12 monthly intramuscular. (Patient not taking: Reported on 02/14/2024), Disp: 50 each, Rfl: 4   trazodone  (DESYREL ) 300 MG tablet, Take 300 mg by mouth at bedtime., Disp: , Rfl:   Observations/Objective: Patient is well-developed, well-nourished in no acute distress.  Resting comfortably  at home.  Head is normocephalic, atraumatic.  No labored breathing.  Speech is clear and coherent with logical content.  Patient is alert and oriented at baseline.  Insect bite left arm nickle sized   Assessment and Plan: 1. Insect bite, unspecified site, initial encounter (  Primary)  Patient currently on doxycycline  bit by insect no absolute tick seen. Will treat symptomatically at this time. No cellulitis, bullae, SJS or TEN.  Patient advised to mark area, watch for spreading , warmth or drainage. She's in agreement with this plan and that if symptoms worsen she will report in person to be seen.  Follow Up Instructions: I discussed the assessment and treatment plan with the patient. The patient was provided an opportunity to ask questions and all were answered. The patient agreed with the plan and demonstrated an understanding of the instructions.  A copy of instructions were sent to the patient via MyChart unless otherwise noted below.    The patient was advised to call back or seek an in-person evaluation if the symptoms worsen or if the condition fails to improve as anticipated.    Stacy Settle, PA-C

## 2024-04-03 ENCOUNTER — Encounter (INDEPENDENT_AMBULATORY_CARE_PROVIDER_SITE_OTHER): Payer: Self-pay

## 2024-04-24 DIAGNOSIS — I341 Nonrheumatic mitral (valve) prolapse: Secondary | ICD-10-CM | POA: Diagnosis not present

## 2024-04-24 DIAGNOSIS — Q796 Ehlers-Danlos syndrome, unspecified: Secondary | ICD-10-CM | POA: Diagnosis not present

## 2024-04-30 DIAGNOSIS — M9904 Segmental and somatic dysfunction of sacral region: Secondary | ICD-10-CM | POA: Diagnosis not present

## 2024-04-30 DIAGNOSIS — M51361 Other intervertebral disc degeneration, lumbar region with lower extremity pain only: Secondary | ICD-10-CM | POA: Diagnosis not present

## 2024-04-30 DIAGNOSIS — M9905 Segmental and somatic dysfunction of pelvic region: Secondary | ICD-10-CM | POA: Diagnosis not present

## 2024-04-30 DIAGNOSIS — M9903 Segmental and somatic dysfunction of lumbar region: Secondary | ICD-10-CM | POA: Diagnosis not present

## 2024-05-01 ENCOUNTER — Encounter (INDEPENDENT_AMBULATORY_CARE_PROVIDER_SITE_OTHER): Payer: Self-pay

## 2024-05-01 NOTE — Telephone Encounter (Signed)
 Could you check patient insurance to see if authorization is needed for CT

## 2024-05-02 DIAGNOSIS — M51361 Other intervertebral disc degeneration, lumbar region with lower extremity pain only: Secondary | ICD-10-CM | POA: Diagnosis not present

## 2024-05-02 DIAGNOSIS — M9903 Segmental and somatic dysfunction of lumbar region: Secondary | ICD-10-CM | POA: Diagnosis not present

## 2024-05-02 DIAGNOSIS — M9905 Segmental and somatic dysfunction of pelvic region: Secondary | ICD-10-CM | POA: Diagnosis not present

## 2024-05-02 DIAGNOSIS — M9904 Segmental and somatic dysfunction of sacral region: Secondary | ICD-10-CM | POA: Diagnosis not present

## 2024-05-03 ENCOUNTER — Encounter: Payer: Self-pay | Admitting: Nurse Practitioner

## 2024-05-07 DIAGNOSIS — M51361 Other intervertebral disc degeneration, lumbar region with lower extremity pain only: Secondary | ICD-10-CM | POA: Diagnosis not present

## 2024-05-07 DIAGNOSIS — M9905 Segmental and somatic dysfunction of pelvic region: Secondary | ICD-10-CM | POA: Diagnosis not present

## 2024-05-07 DIAGNOSIS — M9903 Segmental and somatic dysfunction of lumbar region: Secondary | ICD-10-CM | POA: Diagnosis not present

## 2024-05-07 DIAGNOSIS — M9904 Segmental and somatic dysfunction of sacral region: Secondary | ICD-10-CM | POA: Diagnosis not present

## 2024-05-11 ENCOUNTER — Ambulatory Visit
Admission: RE | Admit: 2024-05-11 | Discharge: 2024-05-11 | Disposition: A | Discharge status: Home / Self Care | Source: Ambulatory Visit | Attending: Nurse Practitioner | Admitting: Nurse Practitioner

## 2024-05-11 DIAGNOSIS — Z8249 Family history of ischemic heart disease and other diseases of the circulatory system: Secondary | ICD-10-CM | POA: Diagnosis not present

## 2024-05-11 DIAGNOSIS — R932 Abnormal findings on diagnostic imaging of liver and biliary tract: Secondary | ICD-10-CM | POA: Diagnosis not present

## 2024-05-11 DIAGNOSIS — Z0389 Encounter for observation for other suspected diseases and conditions ruled out: Secondary | ICD-10-CM | POA: Diagnosis not present

## 2024-05-11 DIAGNOSIS — Z136 Encounter for screening for cardiovascular disorders: Secondary | ICD-10-CM | POA: Diagnosis not present

## 2024-05-11 DIAGNOSIS — I719 Aortic aneurysm of unspecified site, without rupture: Secondary | ICD-10-CM | POA: Diagnosis not present

## 2024-05-11 MED ORDER — IOHEXOL 300 MG/ML  SOLN
100.0000 mL | Freq: Once | INTRAMUSCULAR | Status: AC | PRN
  Administered 2024-05-11: 100 mL via INTRAVENOUS

## 2024-05-14 DIAGNOSIS — L658 Other specified nonscarring hair loss: Secondary | ICD-10-CM | POA: Diagnosis not present

## 2024-05-21 ENCOUNTER — Ambulatory Visit

## 2024-05-23 DIAGNOSIS — M9904 Segmental and somatic dysfunction of sacral region: Secondary | ICD-10-CM | POA: Diagnosis not present

## 2024-05-23 DIAGNOSIS — M9905 Segmental and somatic dysfunction of pelvic region: Secondary | ICD-10-CM | POA: Diagnosis not present

## 2024-05-23 DIAGNOSIS — M9903 Segmental and somatic dysfunction of lumbar region: Secondary | ICD-10-CM | POA: Diagnosis not present

## 2024-05-23 DIAGNOSIS — M51361 Other intervertebral disc degeneration, lumbar region with lower extremity pain only: Secondary | ICD-10-CM | POA: Diagnosis not present

## 2024-06-14 DIAGNOSIS — M9903 Segmental and somatic dysfunction of lumbar region: Secondary | ICD-10-CM | POA: Diagnosis not present

## 2024-06-14 DIAGNOSIS — M9904 Segmental and somatic dysfunction of sacral region: Secondary | ICD-10-CM | POA: Diagnosis not present

## 2024-06-14 DIAGNOSIS — M51361 Other intervertebral disc degeneration, lumbar region with lower extremity pain only: Secondary | ICD-10-CM | POA: Diagnosis not present

## 2024-06-14 DIAGNOSIS — M9905 Segmental and somatic dysfunction of pelvic region: Secondary | ICD-10-CM | POA: Diagnosis not present

## 2024-06-15 ENCOUNTER — Encounter: Payer: Self-pay | Admitting: Student in an Organized Health Care Education/Training Program

## 2024-06-15 ENCOUNTER — Encounter (INDEPENDENT_AMBULATORY_CARE_PROVIDER_SITE_OTHER): Payer: Self-pay

## 2024-06-19 DIAGNOSIS — Z7409 Other reduced mobility: Secondary | ICD-10-CM | POA: Diagnosis not present

## 2024-06-19 DIAGNOSIS — K900 Celiac disease: Secondary | ICD-10-CM | POA: Diagnosis not present

## 2024-06-19 DIAGNOSIS — M797 Fibromyalgia: Secondary | ICD-10-CM | POA: Diagnosis not present

## 2024-06-19 DIAGNOSIS — R2689 Other abnormalities of gait and mobility: Secondary | ICD-10-CM | POA: Diagnosis not present

## 2024-06-19 DIAGNOSIS — Q796 Ehlers-Danlos syndrome, unspecified: Secondary | ICD-10-CM | POA: Diagnosis not present

## 2024-06-19 DIAGNOSIS — G909 Disorder of the autonomic nervous system, unspecified: Secondary | ICD-10-CM | POA: Diagnosis not present

## 2024-06-22 ENCOUNTER — Ambulatory Visit (INDEPENDENT_AMBULATORY_CARE_PROVIDER_SITE_OTHER): Admitting: Vascular Surgery

## 2024-06-22 ENCOUNTER — Encounter (INDEPENDENT_AMBULATORY_CARE_PROVIDER_SITE_OTHER): Payer: Self-pay | Admitting: Vascular Surgery

## 2024-06-22 VITALS — BP 102/65 | HR 63 | Resp 18 | Wt 238.4 lb

## 2024-06-22 DIAGNOSIS — G894 Chronic pain syndrome: Secondary | ICD-10-CM | POA: Diagnosis not present

## 2024-06-22 DIAGNOSIS — M797 Fibromyalgia: Secondary | ICD-10-CM | POA: Diagnosis not present

## 2024-06-22 DIAGNOSIS — M79661 Pain in right lower leg: Secondary | ICD-10-CM

## 2024-06-22 DIAGNOSIS — Q796 Ehlers-Danlos syndrome, unspecified: Secondary | ICD-10-CM | POA: Diagnosis not present

## 2024-06-22 DIAGNOSIS — Q7962 Hypermobile Ehlers-Danlos syndrome: Secondary | ICD-10-CM | POA: Diagnosis not present

## 2024-06-22 DIAGNOSIS — G909 Disorder of the autonomic nervous system, unspecified: Secondary | ICD-10-CM | POA: Diagnosis not present

## 2024-06-22 DIAGNOSIS — M542 Cervicalgia: Secondary | ICD-10-CM | POA: Diagnosis not present

## 2024-06-22 DIAGNOSIS — K900 Celiac disease: Secondary | ICD-10-CM | POA: Diagnosis not present

## 2024-06-22 DIAGNOSIS — M79662 Pain in left lower leg: Secondary | ICD-10-CM

## 2024-06-22 DIAGNOSIS — R2689 Other abnormalities of gait and mobility: Secondary | ICD-10-CM | POA: Diagnosis not present

## 2024-06-22 DIAGNOSIS — M79609 Pain in unspecified limb: Secondary | ICD-10-CM | POA: Insufficient documentation

## 2024-06-22 DIAGNOSIS — Z7409 Other reduced mobility: Secondary | ICD-10-CM | POA: Diagnosis not present

## 2024-06-22 DIAGNOSIS — G8929 Other chronic pain: Secondary | ICD-10-CM

## 2024-06-22 NOTE — Assessment & Plan Note (Signed)
 No evidence of vascular disease at this time.  May benefit from an Ehlers-Danlos/genetics specialty clinic

## 2024-06-22 NOTE — Progress Notes (Signed)
 MRN : [id number]  Stacy West is a 43 y.o. ([birth year]) female who presents with chief complaint of  Chief Complaint  Patient presents with   Follow-up    4 month ct results  .  History of Present Illness: Patient returns today in follow up of her Ehlers-Danlos and her pain.  She states she is very disappointed she has not had a whole-body scan to look at all her blood vessels to make sure they are okay.  She did have a CT scan of the chest abdomen pelvis back in June which I have independently reviewed which showed essentially no vascular abnormalities with no aneurysmal degeneration of the aorta in the chest or the abdomen.  She has had no focal neurologic symptoms of cerebrovascular ischemia.  She complains of a lot of neck pain.  She has had previous cervical fusion.  She goes to a chiropractor and gets spinal manipulation which I have strongly recommended that she not do.  Cervical manipulation is at risk in general and is at much higher risk in a patient with cervical spine surgery as well as a patient with Ehlers-Danlos, both of which she fits the mold for.  I have told her I do not believe she should have this done for fear of in particular vertebral artery dissection.  I cannot see any way I would see this is a safe endeavor from a vascular standpoint. She also complains of a lot of tingling and numbness in her legs.  She does not have typical claudication symptoms.  As above, after reviewing her CT scan of the chest abdomen pelvis she does not have any inflow lesions.  No ulceration or infection.  No rest pain.  Current Outpatient Medications  Medication Sig Dispense Refill   albuterol  (VENTOLIN  HFA) 108 (90 Base) MCG/ACT inhaler Inhale 1-2 puffs into the lungs every 4 (four) hours as needed for wheezing or shortness of breath (cough). 8 g 0   cyclobenzaprine  (FLEXERIL ) 10 MG tablet Take 1 tablet (10 mg total) by mouth 3 (three) times daily as needed for muscle spasms. 270 tablet 4    DULoxetine  (CYMBALTA ) 30 MG capsule Take 30 mg by mouth daily.     guanFACINE  (INTUNIV ) 2 MG TB24 ER tablet Take 2 mg by mouth daily.     hydrocortisone  1 % ointment Apply 1 Application topically 2 (two) times daily. 30 g 0   levocetirizine (XYZAL  ALLERGY  24HR) 5 MG tablet Take 1 tablet (5 mg total) by mouth every evening for 10 days. 10 tablet 0   Levomefolate Glucosamine (METHYL-FOLATE PO) Take by mouth.     medroxyPROGESTERone  (PROVERA ) 10 MG tablet Take 10 mg by mouth daily. Per patient takes for 10 days then off for 2 weeks then repeats cycle     metFORMIN  (GLUCOPHAGE -XR) 500 MG 24 hr tablet Take 4 tablets by mouth at bedtime.     montelukast  (SINGULAIR ) 10 MG tablet TAKE 1 TABLET BY MOUTH EVERYDAY AT BEDTIME 90 tablet 0   NALTREXONE HCL PO Take 1.5 mg by mouth daily.     Omega-3 1000 MG CAPS Take 1 capsule by mouth every morning.     pantoprazole  (PROTONIX ) 40 MG tablet TAKE 1 TABLET BY MOUTH EVERY DAY 90 tablet 3   pregabalin  (LYRICA ) 50 MG capsule Take 1 capsule (50 mg total) by mouth at bedtime. 90 capsule 2   trazodone  (DESYREL ) 300 MG tablet Take 300 mg by mouth at bedtime.     cetirizine  (ZYRTEC ) 10 MG  tablet TAKE 1 TABLET BY MOUTH EVERY DAY (Patient not taking: Reported on 06/22/2024) 90 tablet 1   Cholecalciferol  1.25 MG (50000 UT) TABS Take 1 tablet by mouth once a week. (Patient not taking: Reported on 06/22/2024) 12 tablet 4   cyanocobalamin  (VITAMIN B12) 1000 MCG/ML injection Inject 1 mL (1,000 mcg total) into the muscle every 30 (thirty) days. (Patient not taking: Reported on 01/18/2024) 30 mL 2   levocetirizine (XYZAL ) 5 MG tablet TAKE 1 TABLET BY MOUTH EVERY DAY IN THE EVENING (Patient not taking: Reported on 06/22/2024) 90 tablet 1   SYRINGE-NEEDLE, DISP, 3 ML (B-D 3CC LUER-LOK SYR 20GX1) 20G X 1 3 ML MISC Use to inject B12 monthly intramuscular. (Patient not taking: Reported on 06/22/2024) 50 each 4   No current facility-administered medications for this visit.    Past Medical  History:  Diagnosis Date   Abnormal Pap smear of cervix    Allergy     Celiac disease    Depression    Ehlers-Danlos disease    Fibromyalgia    Heart murmur    Insulin  resistance    Miscarriage    Other cervical disc degeneration at C4-C5 level    PCOS (polycystic ovarian syndrome)    PCOS (polycystic ovarian syndrome)    Premature ovarian failure    Recurrent upper respiratory infection (URI)    SI (sacroiliac) joint dysfunction    Sleep apnea    Thyroid  disease     Past Surgical History:  Procedure Laterality Date   ADENOIDECTOMY     BACK SURGERY     COLPOSCOPY     FINGER SURGERY Left    NECK SURGERY  07/2020   TONSILLECTOMY     WISDOM TOOTH EXTRACTION       Social History   Tobacco Use   Smoking status: Former    Current packs/day: 0.00    Average packs/day: 1 pack/day for 18.0 years (18.0 ttl pk-yrs)    Types: Cigarettes    Start date: 2000    Quit date: 2018    Years since quitting: 7.6   Smokeless tobacco: Never  Vaping Use   Vaping status: Never Used  Substance Use Topics   Alcohol use: Not Currently    Comment: occ   Drug use: Not Currently      Family History  Problem Relation Age of Onset   Heart murmur Mother    Asthma Mother    Skin cancer Father    COPD Father    Bipolar disorder Brother    Depression Brother    Alcoholism Brother    Depression Brother    Heart murmur Maternal Aunt    Breast cancer Maternal Aunt    Early menopause Maternal Aunt    Asthma Maternal Grandmother   Father DVT   Allergies  Allergen Reactions   Carisoprodol-Aspirin-Codeine Hives and Itching   Carisoprodol Dermatitis   Percocet  [Oxycodone-Acetaminophen ] Dermatitis   Gluten Meal Hives and Nausea Only   Tramadol      REVIEW OF SYSTEMS (Negative unless checked)  Constitutional: [] Weight loss  [] Fever  [] Chills Cardiac: [] Chest pain   [] Chest pressure   [] Palpitations   [] Shortness of breath when laying flat   [] Shortness of breath at rest    [] Shortness of breath with exertion. Vascular:  [] Pain in legs with walking   [] Pain in legs at rest   [] Pain in legs when laying flat   [] Claudication   [] Pain in feet when walking  [] Pain in feet at rest  []   Pain in feet when laying flat   [] History of DVT   [] Phlebitis   [] Swelling in legs   [] Varicose veins   [] Non-healing ulcers Pulmonary:   [] Uses home oxygen   [] Productive cough   [] Hemoptysis   [] Wheeze  [] COPD   [] Asthma Neurologic:  [] Dizziness  [] Blackouts   [] Seizures   [] History of stroke   [] History of TIA  [] Aphasia   [] Temporary blindness   [] Dysphagia   [x] Weakness or numbness in arms   [x] Weakness or numbness in legs Musculoskeletal:  [] Arthritis   [] Joint swelling   [x] Joint pain   [x] Low back pain Hematologic:  [] Easy bruising  [] Easy bleeding   [] Hypercoagulable state   [] Anemic   Gastrointestinal:  [] Blood in stool   [] Vomiting blood  [] Gastroesophageal reflux/heartburn   [] Abdominal pain Genitourinary:  [] Chronic kidney disease   [] Difficult urination  [] Frequent urination  [] Burning with urination   [] Hematuria Skin:  [] Rashes   [] Ulcers   [] Wounds Psychological:  [x] History of anxiety   [x]  History of major depression.  Physical Examination  BP 102/65   Pulse 63   Resp 18   Wt 238 lb 6.4 oz (108.1 kg)   BMI 33.25 kg/m  Gen:  WD/WN, NAD Head: [place]/AT, No temporalis wasting. Ear/Nose/Throat: Hearing grossly intact, nares w/o erythema or drainage Eyes: Conjunctiva clear. Sclera non-icteric Neck: Supple.  Trachea midline Pulmonary:  Good air movement, no use of accessory muscles.  Cardiac: RRR, no JVD  Musculoskeletal: M/S 5/5 throughout.  No deformity or atrophy. No edema. Neurologic: Sensation grossly intact in extremities.  Symmetrical.  Speech is fluent.  Psychiatric: Judgment intact, Mood & affect appropriate for pt's clinical situation. Dermatologic: No rashes or ulcers noted.  No cellulitis or open wounds.      Labs No results found for this or any  previous visit (from the past 2160 hours).  Radiology No results found.  Assessment/Plan  Chronic neck pain She complains of a lot of neck pain.  She has had previous cervical fusion.  She goes to a chiropractor and gets spinal manipulation which I have strongly recommended that she not do.  Cervical manipulation is at risk in general and is at much higher risk in a patient with cervical spine surgery as well as a patient with Ehlers-Danlos, both of which she fits the mold for.  I have told her I do not believe she should have this done for fear of in particular vertebral artery dissection.  I cannot see any way I would see this is a safe endeavor from a vascular standpoint.  I am happy to check a carotid duplex for evaluation of her extracranial cerebrovascular circulation.  This can be done at her convenience.  Pain in limb She also complains of a lot of tingling and numbness in her legs.  She does not have typical claudication symptoms.  As above, after reviewing her CT scan of the chest abdomen pelvis she does not have any inflow lesions.  No ulceration or infection.  No rest pain.  At her request, I am going to check ABIs although I think the yield is fairly low for vascular disease of the lower extremities.  Chronic pain syndrome At this point, I think vascular disease has very little to do with her pain and is mostly from her chronic pain syndrome and other issues.  Ehlers-Danlos syndrome type III No evidence of vascular disease at this time.  May benefit from an Ehlers-Danlos/genetics specialty clinic    Selinda Gu, MD  06/22/2024 2:11 PM    This note was created with Dragon medical transcription system.  Any errors from dictation are purely unintentional

## 2024-06-22 NOTE — Assessment & Plan Note (Signed)
 She also complains of a lot of tingling and numbness in her legs.  She does not have typical claudication symptoms.  As above, after reviewing her CT scan of the chest abdomen pelvis she does not have any inflow lesions.  No ulceration or infection.  No rest pain.  At her request, I am going to check ABIs although I think the yield is fairly low for vascular disease of the lower extremities.

## 2024-06-22 NOTE — Assessment & Plan Note (Signed)
 At this point, I think vascular disease has very little to do with her pain and is mostly from her chronic pain syndrome and other issues.

## 2024-06-22 NOTE — Assessment & Plan Note (Signed)
 She complains of a lot of neck pain.  She has had previous cervical fusion.  She goes to a chiropractor and gets spinal manipulation which I have strongly recommended that she not do.  Cervical manipulation is at risk in general and is at much higher risk in a patient with cervical spine surgery as well as a patient with Ehlers-Danlos, both of which she fits the mold for.  I have told her I do not believe she should have this done for fear of in particular vertebral artery dissection.  I cannot see any way I would see this is a safe endeavor from a vascular standpoint.  I am happy to check a carotid duplex for evaluation of her extracranial cerebrovascular circulation.  This can be done at her convenience.

## 2024-06-27 ENCOUNTER — Telehealth: Payer: Self-pay | Admitting: *Deleted

## 2024-06-27 ENCOUNTER — Encounter: Payer: Self-pay | Admitting: Student in an Organized Health Care Education/Training Program

## 2024-06-27 NOTE — Telephone Encounter (Signed)
 Attempted to call for pre telephone appt mursing assessment. Message left.

## 2024-06-28 ENCOUNTER — Ambulatory Visit (INDEPENDENT_AMBULATORY_CARE_PROVIDER_SITE_OTHER)

## 2024-06-28 ENCOUNTER — Encounter: Payer: Self-pay | Admitting: Nurse Practitioner

## 2024-06-28 ENCOUNTER — Ambulatory Visit
Attending: Student in an Organized Health Care Education/Training Program | Admitting: Student in an Organized Health Care Education/Training Program

## 2024-06-28 VITALS — BP 124/84 | HR 95 | Temp 97.3°F | Resp 16 | Ht 71.0 in | Wt 238.0 lb

## 2024-06-28 DIAGNOSIS — Q7962 Hypermobile Ehlers-Danlos syndrome: Secondary | ICD-10-CM | POA: Insufficient documentation

## 2024-06-28 DIAGNOSIS — M79662 Pain in left lower leg: Secondary | ICD-10-CM

## 2024-06-28 DIAGNOSIS — M542 Cervicalgia: Secondary | ICD-10-CM | POA: Diagnosis not present

## 2024-06-28 DIAGNOSIS — G8929 Other chronic pain: Secondary | ICD-10-CM | POA: Diagnosis not present

## 2024-06-28 DIAGNOSIS — M79661 Pain in right lower leg: Secondary | ICD-10-CM | POA: Diagnosis not present

## 2024-06-28 DIAGNOSIS — M5481 Occipital neuralgia: Secondary | ICD-10-CM | POA: Diagnosis not present

## 2024-06-28 DIAGNOSIS — G894 Chronic pain syndrome: Secondary | ICD-10-CM | POA: Insufficient documentation

## 2024-06-28 NOTE — Progress Notes (Signed)
 Safety precautions to be maintained throughout the outpatient stay will include: orient to surroundings, keep bed in low position, maintain call bell within reach at all times, provide assistance with transfer out of bed and ambulation.   Patient states she has quit taking medications afraid for reactions etc.  Has hormone issues.  Patient is scared and feels like no one is listening to her issues. Upset that she had to come in to office today related to disturbance with family. Patient stopped medication, she is afraid of having reactions. Patient upset with parking and did not use our parking services- did not want them to move her settings on car that are appropiate for her illness.  Stood outside door until door was shut pby patient, I then walked in to room.

## 2024-06-28 NOTE — Progress Notes (Signed)
 PROVIDER NOTE: Interpretation of information contained herein should be left to medically-trained personnel. Specific patient instructions are provided elsewhere under Patient Instructions section of medical record. This document was created in part using AI and STT-dictation technology, any transcriptional errors that may result from this process are unintentional.  Patient: Stacy West  Service: E/M   PCP: Valerio Melanie DASEN, NP  DOB: January 08, 1981  DOS: 06/28/2024  Provider: Wallie Sherry, MD  MRN: [id number]  Delivery: Face-to-face  Specialty: Interventional Pain Management  Type: Established Patient  Setting: Ambulatory outpatient facility  Specialty designation: 09  Referring Prov.: Valerio Melanie DASEN, NP  Location: Outpatient office facility       History of present illness (HPI) Stacy West, a 43 y.o. year old female, is here today because of her Ehlers-Danlos syndrome type III [Q79.62]. Stacy West primary complain today is chronic pain- cervical spine, shoulders  Pain Assessment: Severity of Chronic pain is reported as a 6 /10. Location:  (fibro.) Right, Left/ (patient had fibro and hurts all over). Onset: More than a month ago. Quality:  . Timing: Constant. Modifying factor(s): Nothing. Vitals:  height is 5' 11 (1.803 m) and weight is 238 lb (108 kg). Her temperature is 97.3 F (36.3 C) (abnormal). Her blood pressure is 124/84 and her pulse is 95. Her respiration is 16 and oxygen saturation is 100%.  BMI: Estimated body mass index is 33.19 kg/m as calculated from the following:   Height as of this encounter: 5' 11 (1.803 m).   Weight as of this encounter: 238 lb (108 kg).  Last encounter: 10/06/2023. Last procedure: Visit date not found.  Reason for encounter:   The patient was originally referred by Dr. Dodson on 08/18/2023 for consideration of cervical spinal cord stimulation. As documented in my clinic note dated 08/18/2023, no cervical spinal cord stimulator was  recommended for her at that time however we did discuss these therapies and further diagnostic workup via cervical and thoracic MRI .  The patient was seen again on 10/06/2023, at which time recent cervical MRI scans were reviewed. During that visit, Toradol  and Robaxin  injections were administered, a TENS unit trial was recommended, and I advised massage therapy with a provider experienced in Ehlers-Danlos patients. I also recommended establishing care with a rheumatologist familiar with Ehlers-Danlos and autoimmune conditions, with ongoing monitoring of C-reactive protein and ESR levels. At no time during either visit did I recommend cervical spinal cord stimulation- we discussed stimulation therapies which is what Dr Dodson referred her for on 08/18/23 and I informed her we would need to do further diagnostic workup via MRI of her cervical and thoracic spine to determine if she would be a good candidate.  Of note on her visit on 08/18/2023, she was requesting a prescription for medical marijuana.  During today's visit, the patient incorrectly stated that I had recommended cervical spinal cord stimulation, which is not accurate.   The patient was disrespectful, spoke over me repeatedly, and was difficult to redirect, making the encounter challenging and limiting my ability to conduct a focused evaluation. She was also disrespectful to staff.  Patient also inaccurately stated that the shots were not helpful however I have not treated her with any injections in the past.    HPI from previous clinic visits: 10/06/23 History of Present Illness   The patient, with a history of Ehlers-Danlos syndrome and suspected autoimmune conditions, presents with worsening pain. They report a recent illness characterized by coughing and sneezing, which exacerbated their pain and  led to instances of neck and shoulder subluxations. This resulted in severe discomfort, necessitating multiple chiropractic adjustments and  the use of ice for relief. At its worst, the patient experienced difficulty lifting their head and breathing due to pain around the back rib area. This episode lasted approximately three weeks, with residual symptoms still present.   The patient describes their current pain as radiating through the shoulders and back, with limited neck mobility causing additional discomfort. They also report difficulty eating due to the severity of the pain. Despite these symptoms, recent MRI scans of the cervical and thoracic spine appear stable and unremarkable. The patient is concerned about potential inflammation and the role of their autoimmune conditions in their current symptoms. They are scheduled to see a specialist for further evaluation of these conditions in the coming month.   The patient has been managing their pain with chiropractic adjustments and is considering the use of a self-massager for their neck and shoulders. However, they express concern about potential negative effects on their surgical history and the possibility of causing more subluxations. They are also considering the use of a TENS unit for muscle stimulation. The patient has found relief from Toradol  and Dilaudid  in the past, but understands the latter is typically reserved for post-surgical pain management.   Assessment and Plan from 10/06/23 Chronic Neck and Back Pain They are experiencing worsening neck and shoulder blade pain, exacerbated by a recent illness with coughing and sneezing, which radiates through the back and neck, causing difficulty in lifting the head and breathing. Cervical and thoracic MRIs show well-managed hardware and no significant stenosis, suggesting the pain is likely musculoskeletal, influenced by Ehlers-Danlos syndrome. We will administer Toradol  and Robaxin  injections, having discussed the risks, benefits, and potential side effects, including the need to avoid anti-inflammatory medications for five days  post-injection. We demonstrated the TENS unit for potential use and advised against using self-massaging machines without professional guidance. We recommend consulting a massage therapist experienced with Ehlers-Danlos patients.   Autoimmune Conditions (Lupus, Mast Cell Activation) They report ongoing inflammation and pain, with concerns about lupus and mast cell activation contributing to symptoms. An upcoming specialist appointment is scheduled for April. We emphasized the importance of finding a rheumatologist experienced with Ehlers-Danlos and other autoimmune conditions. We discussed monitoring biomarkers such as C-reactive protein and ESR for signs of inflammation.    General Health Maintenance They inquired about the safety of using massagers for neck and shoulder pain. We advised consulting a massage therapist experienced with Ehlers-Danlos patients and recommended against using self-massaging machines without professional guidance.      HPI from initial clinic visit 08/18/23: Stacy West is a 43 year old female with a fairly complicated extensive past medical history including occiput to C2 fusion in addition to a C6-C7 ACDF.  Chief complaints of cervical spine pain, occipital pain, mid thoracic pain.  Lumbar spine pain.  Her history is notable for fibromyalgia, Ehlers-Danlos disease, and SI joint pain.  She has many doctors and healthcare providers that she sees.  She engages in physical therapy.  She states that she is unable to tolerate steroids.  She has been seen by physical medicine and rehab.  She is being referred here to consider spinal cord stimulation. She states that she does not have a clear understanding of what that entails.  Assessment and Plan from 08/18/23- initial clinic visit We had a very detailed discussion about her medical history.  Patient has Ehlers-Danlos syndrome which I believe contributes to a significant portion  of her musculoskeletal pain.  Patients with EDS  will  have increased joint laxity and increased myalgias and arthralgias.  As a result of her Ehlers-Danlos syndrome and cervical spine instability she had a occiput to C2 fusion as well as a C6-C7 cervical fusion.  She endorses cervical spine pain with radiation into bilateral arms as well as midthoracic pain.  She states that this is very problematic for her and she has difficulty flexing and extending.  We discussed a peripheral nerve stimulator as well as spinal cord stimulator in the context of her having a cervical fusion.  I would like to obtain a MRI of her cervical and thoracic spine for further workup.  I will call her with results at which point we will further discuss stimulation therapies.  Patient in agreement with plan.   Of note, patient was requesting a prescription for medical marijuana and I informed her that it is not legal in [place]  at this time.  She states that she found benefit with it when she was in [place] .     No results found for: [id number]  ROS  Constitutional: Denies any fever or chills Gastrointestinal: No reported hemesis, hematochezia, vomiting, or acute GI distress Musculoskeletal: diffuse MSK pain- cervical spine, shoulders, thoracic spine, and legs Neurological: No reported episodes of acute onset apraxia, aphasia, dysarthria, agnosia, amnesia, paralysis, loss of coordination, or loss of consciousness  Medication Review  Cholecalciferol , DULoxetine , Levomefolate Glucosamine, Naltrexone HCl, Omega-3, SYRINGE-NEEDLE (DISP) 3 ML, albuterol , cetirizine , cyanocobalamin , cyclobenzaprine , estradiol , guanFACINE , hydrocortisone , ibuprofen, levocetirizine, medroxyPROGESTERone , metFORMIN , montelukast , pantoprazole , pregabalin , and trazodone   History Review  Allergy : Stacy West is allergic to carisoprodol-aspirin-codeine, carisoprodol, percocet  [oxycodone-acetaminophen ], gluten meal, and tramadol. Drug: Stacy West  reports that she does not currently use  drugs. Alcohol:  reports that she does not currently use alcohol. Tobacco:  reports that she quit smoking about 7 years ago. Her smoking use included cigarettes. She started smoking about 25 years ago. She has a 18 pack-year smoking history. She has never used smokeless tobacco. Social: Stacy West  reports that she quit smoking about 7 years ago. Her smoking use included cigarettes. She started smoking about 25 years ago. She has a 18 pack-year smoking history. She has never used smokeless tobacco. She reports that she does not currently use alcohol. She reports that she does not currently use drugs. Medical:  has a past medical history of Abnormal Pap smear of cervix, Allergy , Celiac disease, Depression, Ehlers-Danlos disease, Fibromyalgia, Heart murmur, Insulin  resistance, Miscarriage, Other cervical disc degeneration at C4-C5 level, PCOS (polycystic ovarian syndrome), PCOS (polycystic ovarian syndrome), Premature ovarian failure, Recurrent upper respiratory infection (URI), SI (sacroiliac) joint dysfunction, Sleep apnea, and Thyroid  disease. Surgical: Stacy West  has a past surgical history that includes Back surgery; Colposcopy; Finger surgery (Left); Neck surgery (07/2020); Tonsillectomy; Wisdom tooth extraction; and Adenoidectomy. Family: family history includes Alcoholism in her brother; Asthma in her maternal grandmother and mother; Bipolar disorder in her brother; Breast cancer in her maternal aunt; COPD in her father; Depression in her brother and brother; Early menopause in her maternal aunt; Heart murmur in her maternal aunt and mother; Skin cancer in her father.  Laboratory Chemistry Profile   Renal Lab Results  Component Value Date   BUN 10 01/09/2024   CREATININE 0.61 01/09/2024   BCR 14 02/28/2023   GFRAA >60 05/25/2019   GFRNONAA >60 01/09/2024    Hepatic Lab Results  Component Value Date   AST 19 01/08/2024   ALT  20 01/08/2024   ALBUMIN 4.1 01/08/2024   ALKPHOS 46  01/08/2024   LIPASE 35 01/08/2024    Electrolytes Lab Results  Component Value Date   NA 142 01/09/2024   K 3.6 01/09/2024   CL 111 01/09/2024   CALCIUM 8.6 (L) 01/09/2024   MG 2.1 01/08/2024   PHOS 3.5 01/08/2024    Bone Lab Results  Component Value Date   VD25OH 31.3 02/28/2023   TESTOFREE 1.5 04/28/2021   TESTOSTERONE  28 04/28/2021    Inflammation (CRP: Acute Phase) (ESR: Chronic Phase) Lab Results  Component Value Date   CRP 3 02/28/2023   ESRSEDRATE 6 02/28/2023         Note: Above Lab results reviewed.  Recent Imaging Review  CT CHEST ABDOMEN PELVIS W CONTRAST CLINICAL DATA:  Concern for thoracic aortic aneurysm. Family history of aortic aneurysm.  EXAM: CT CHEST, ABDOMEN, AND PELVIS WITH CONTRAST  TECHNIQUE: Multidetector CT imaging of the chest, abdomen and pelvis was performed following the standard protocol during bolus administration of intravenous contrast.  RADIATION DOSE REDUCTION: This exam was performed according to the departmental dose-optimization program which includes automated exposure control, adjustment of the mA and/or kV according to patient size and/or use of iterative reconstruction technique.  CONTRAST:  OMNIPAQUE  IOHEXOL  300 MG/ML  SOLN  COMPARISON:  None Available.  FINDINGS: CT CHEST FINDINGS  Cardiovascular: Ascending thoracic aorta measures 28 mm which is normal. Descending thoracic aorta measures 20 mm which is normal. Great vessels are normal.  Mediastinum/Nodes: No axillary or supraclavicular adenopathy. No mediastinal or hilar adenopathy. No pericardial fluid. Esophagus normal.  Lungs/Pleura: No suspicious pulmonary nodules. Normal pleural. Airways normal.  Musculoskeletal: No aggressive osseous lesion.  CT ABDOMEN AND PELVIS FINDINGS  Hepatobiliary: Small 5 mm hypodense lesion in the LEFT hepatic lobe is favored benign. No biliary ductal dilatation. Gallbladder is normal. Common bile duct is  normal.  Pancreas: Pancreas is normal. No ductal dilatation. No pancreatic inflammation.  Spleen: Normal spleen  Adrenals/urinary tract: Adrenal glands and kidneys are normal. The ureters and bladder normal.  Stomach/Bowel: Stomach, small bowel, appendix, and cecum are normal. The colon and rectosigmoid colon are normal.  Vascular/Lymphatic: Abdominal aorta is normal caliber at 13 mm. Iliac arteries are normal caliber.  Reproductive: Uterus and adnexa unremarkable.  Other: No free fluid.  Musculoskeletal: No aggressive osseous lesion.  IMPRESSION: CHEST:  1. No evidence of thoracic aortic aneurysm. Thoracic aorta is normal diameter. 2. No acute findings in the chest.  PELVIS:  1. No evidence of abdominal aortic aneurysm. 2. No acute findings in the abdomen pelvis.  Electronically Signed   By: Jackquline Boxer M.D.   On: 05/11/2024 12:37 Note: Reviewed        Physical Exam  Vitals: BP 124/84   Pulse 95   Temp (!) 97.3 F (36.3 C)   Resp 16   Ht 5' 11 (1.803 m)   Wt 238 lb (108 kg)   SpO2 100%   BMI 33.19 kg/m  BMI: Estimated body mass index is 33.19 kg/m as calculated from the following:   Height as of this encounter: 5' 11 (1.803 m).   Weight as of this encounter: 238 lb (108 kg). Ideal: Ideal body weight: 70.8 kg (156 lb 1.4 oz) Adjusted ideal body weight: 85.7 kg (188 lb 13.6 oz) General appearance: Well nourished, well developed, and well hydrated. In no apparent acute distress Mental status: Alert, oriented x 3 (person, place, & time)       Respiratory:  No evidence of acute respiratory distress Eyes: PERLA   Assessment   Diagnosis  1. Ehlers-Danlos syndrome type III   2. Bilateral occipital neuralgia   3. Cervicalgia   4. Chronic pain syndrome      Updated Problems: No problems updated.  Plan of Care  Plan:  Clarified to the patient that cervical spinal cord stimulation was not specifically recommended in prior visits nor have I done  any injections or interventional therapies for her. We discussed these therapies (which she was referred for by Dr Dodson) however after Cervical and Thoracic MRI- I didn't feel that such therapies were indicated for her. Recommended evaluation at an academic tertiary care center with expertise in Ehlers-Danlos syndrome for comprehensive, multidisciplinary management. No further interventional recommendations from our clinic at this time. Advised follow-up with primary care and specialty providers for coordinated management of her condition. Patient was loud, disruptive and disrespectful on her way out of clinic.  I relayed this information to [name] Cannady her PCP Please see nursing notes in regards to behaviors displayed during clinic   No follow-ups on file.    Recent Visits No visits were found meeting these conditions. Showing recent visits within past 90 days and meeting all other requirements Today's Visits Date Type Provider Dept  06/28/24 Office Visit Marcelino Nurse, MD [facility]  Showing today's visits and meeting all other requirements Future Appointments No visits were found meeting these conditions. Showing future appointments within next 90 days and meeting all other requirements  I discussed the assessment and treatment plan with the patient. The patient was provided an opportunity to ask questions and all were answered. The patient agreed with the plan and demonstrated an understanding of the instructions.  Patient advised to call back or seek an in-person evaluation if the symptoms or condition worsens.  Duration of encounter: - primarily documenting encounter.  Total time on encounter, as per AMA guidelines included both the face-to-face and non-face-to-face time personally spent by the physician and/or other qualified health care professional(s) on the day of the encounter (includes time in activities that require the physician or other qualified  health care professional and does not include time in activities normally performed by clinical staff). Physician's time may include the following activities when performed: Preparing to see the patient (e.g., pre-charting review of records, searching for previously ordered imaging, lab work, and nerve conduction tests) Review of prior analgesic pharmacotherapies. Reviewing PMP Interpreting ordered tests (e.g., lab work, imaging, nerve conduction tests) Performing post-procedure evaluations, including interpretation of diagnostic procedures Obtaining and/or reviewing separately obtained history Performing a medically appropriate examination and/or evaluation Counseling and educating the patient/family/caregiver Ordering medications, tests, or procedures Referring and communicating with other health care professionals (when not separately reported) Documenting clinical information in the electronic or other health record Independently interpreting results (not separately reported) and communicating results to the patient/ family/caregiver Care coordination (not separately reported)  Note by: Nurse Marcelino, MD (TTS and AI technology used. I apologize for any typographical errors that were not detected and corrected.) Date: 06/28/2024; Time: 11:52 AM

## 2024-06-29 ENCOUNTER — Ambulatory Visit (INDEPENDENT_AMBULATORY_CARE_PROVIDER_SITE_OTHER): Admitting: Vascular Surgery

## 2024-06-29 ENCOUNTER — Encounter (INDEPENDENT_AMBULATORY_CARE_PROVIDER_SITE_OTHER): Payer: Self-pay | Admitting: Vascular Surgery

## 2024-06-29 VITALS — BP 96/65 | HR 112 | Ht 71.0 in | Wt 234.4 lb

## 2024-06-29 DIAGNOSIS — M79662 Pain in left lower leg: Secondary | ICD-10-CM | POA: Diagnosis not present

## 2024-06-29 DIAGNOSIS — M79661 Pain in right lower leg: Secondary | ICD-10-CM

## 2024-06-29 DIAGNOSIS — K2100 Gastro-esophageal reflux disease with esophagitis, without bleeding: Secondary | ICD-10-CM

## 2024-06-29 LAB — VAS US ABI WITH/WO TBI
Left ABI: 1.38
Right ABI: 1.26

## 2024-06-29 NOTE — Progress Notes (Signed)
 Subjective:    Patient ID: Stacy West, female    DOB: 1981-01-13, 43 y.o.   MRN: [id number] Chief Complaint  Patient presents with   Carotid    Follow up , having a burning down back of her legs that she stated has been going on for a while pain in both legs     Patient returns to clinic today in follow-up of an evaluation of her vasculature due to her chronic pain.  She still remains very disappointed that her whole body skin has not been done to look at all blood vessels but she was seen by Dr. Selinda Gu a week ago on 06/21/2024 who independently reviewed CT scans and assured her there were no vascular abnormalities or aneurysmal degeneration in her aorta, chest or abdomen.  She endorses that she has not had any focal neurologic symptoms of cerebrovascular ischemia or disease.  She does complain of a lot of neck pain and back pain.  She has had a previous cervical fusion.  She endorses she goes to a chiropractor quite regularly and is unsure if this is helping or hurting her situation.  Again today just as Dr. Gu did in the past I recommend she not do spinal manipulation as it can have effect on your vasculature of your neck and your spine.  She also continues to endorse tingling and numbness to her lower extremities.  She does not have your typical claudication symptoms.  She does not have any ulcerations or infections and she does not have any rest pain.  Today she did however undergo bilateral carotid ultrasounds which were completely normal with 139% stenosis on both sides.  She also underwent bilateral lower extremity arterial duplex ultrasounds with ABIs which are completely normal.  Right ABI today is 1.26 Left ABI today is 1.38.  All waveforms are triphasic.  No other prior studies noted.              Patient returns today in follow up of her Ehlers-Danlos and her pain.  She states she is very disappointed she has not had a whole-body scan to look at all her blood vessels  to make sure they are okay.  She did have a CT scan of the chest abdomen pelvis back in June which I have independently reviewed which showed essentially no vascular abnormalities with no aneurysmal degeneration of the aorta in the chest or the abdomen.  She has had no focal neurologic symptoms of cerebrovascular ischemia.  She complains of a lot of neck pain.  She has had previous cervical fusion.  She goes to a chiropractor and gets spinal manipulation which I have strongly recommended that she not do.  Cervical manipulation is at risk in general and is at much higher risk in a patient with cervical spine surgery as well as a patient with Ehlers-Danlos, both of which she fits the mold for.  I have told her I do not believe she should have this done for fear of in particular vertebral artery dissection.  I cannot see any way I would see this is a safe endeavor from a vascular standpoint. She also complains of a lot of tingling and numbness in her legs.  She does not have typical claudication symptoms.  As above, after reviewing her CT scan of the chest abdomen pelvis she does not have any inflow lesions.  No ulceration or infection.  No rest pain.     Review of Systems  Constitutional: Negative.   Musculoskeletal:  Positive for arthralgias, neck pain and neck stiffness.  All other systems reviewed and are negative.      Objective:   Physical Exam Vitals reviewed.  Constitutional:      Appearance: Normal appearance. She is obese.  HENT:     Head: Normocephalic.  Eyes:     Pupils: Pupils are equal, round, and reactive to light.  Cardiovascular:     Rate and Rhythm: Normal rate and regular rhythm.     Pulses: Normal pulses.     Heart sounds: Murmur heard.  Pulmonary:     Effort: Pulmonary effort is normal.     Breath sounds: Normal breath sounds.  Abdominal:     General: Bowel sounds are normal.     Palpations: Abdomen is soft.  Musculoskeletal:        General: Normal range of motion.   Skin:    General: Skin is warm and dry.     Capillary Refill: Capillary refill takes 2 to 3 seconds.  Neurological:     General: No focal deficit present.     Mental Status: She is alert and oriented to person, place, and time. Mental status is at baseline.  Psychiatric:        Mood and Affect: Mood normal.        Behavior: Behavior normal.        Thought Content: Thought content normal.        Judgment: Judgment normal.     BP 96/65   Pulse (!) 112   Ht 5' 11 (1.803 m)   Wt 234 lb 6.4 oz (106.3 kg)   BMI 32.69 kg/m   Past Medical History:  Diagnosis Date   Abnormal Pap smear of cervix    Allergy     Celiac disease    Depression    Ehlers-Danlos disease    Fibromyalgia    Heart murmur    Insulin  resistance    Miscarriage    Other cervical disc degeneration at C4-C5 level    PCOS (polycystic ovarian syndrome)    PCOS (polycystic ovarian syndrome)    Premature ovarian failure    Recurrent upper respiratory infection (URI)    SI (sacroiliac) joint dysfunction    Sleep apnea    Thyroid  disease     Social History   Socioeconomic History   Marital status: Married    Spouse name: Ross   Number of children: 1   Years of education: Not on file   Highest education level: Associate degree: academic program  Occupational History   Occupation: disability  Tobacco Use   Smoking status: Former    Current packs/day: 0.00    Average packs/day: 1 pack/day for 18.0 years (18.0 ttl pk-yrs)    Types: Cigarettes    Start date: 2000    Quit date: 2018    Years since quitting: 7.6   Smokeless tobacco: Never  Vaping Use   Vaping status: Never Used  Substance and Sexual Activity   Alcohol use: Not Currently    Comment: occ   Drug use: Not Currently   Sexual activity: Yes    Birth control/protection: None  Other Topics Concern   Not on file  Social History Narrative   Not on file   Social Drivers of Health   Financial Resource Strain: High Risk (02/14/2024)   Overall  Financial Resource Strain (CARDIA)    Difficulty of Paying Living Expenses: Very hard  Food Insecurity: Food Insecurity Present (02/14/2024)   Hunger Vital Sign  Worried About Programme researcher, broadcasting/film/video in the Last Year: Sometimes true    Ran Out of Food in the Last Year: Sometimes true  Transportation Needs: Unmet Transportation Needs (02/14/2024)   PRAPARE - Administrator, Civil Service (Medical): Yes    Lack of Transportation (Non-Medical): Yes  Physical Activity: Sufficiently Active (02/14/2024)   Exercise Vital Sign    Days of Exercise per Week: 5 days    Minutes of Exercise per Session: 30 min  Stress: Stress Concern Present (02/14/2024)   [employer] of Occupational Health - Occupational Stress Questionnaire    Feeling of Stress : Rather much  Social Connections: Socially Isolated (02/14/2024)   Social Connection and Isolation Panel    Frequency of Communication with Friends and Family: Never    Frequency of Social Gatherings with Friends and Family: Never    Attends Religious Services: Never    Database administrator or Organizations: No    Attends Banker Meetings: Never    Marital Status: Married  Catering manager Violence: Not At Risk (02/14/2024)   Humiliation, Afraid, Rape, and Kick questionnaire    Fear of Current or Ex-Partner: No    Emotionally Abused: No    Physically Abused: No    Sexually Abused: No    Past Surgical History:  Procedure Laterality Date   ADENOIDECTOMY     BACK SURGERY     COLPOSCOPY     FINGER SURGERY Left    NECK SURGERY  07/2020   TONSILLECTOMY     WISDOM TOOTH EXTRACTION      Family History  Problem Relation Age of Onset   Heart murmur Mother    Asthma Mother    Skin cancer Father    COPD Father    Bipolar disorder Brother    Depression Brother    Alcoholism Brother    Depression Brother    Heart murmur Maternal Aunt    Breast cancer Maternal Aunt    Early menopause Maternal Aunt    Asthma Maternal  Grandmother     Allergies  Allergen Reactions   Carisoprodol-Aspirin-Codeine Hives and Itching   Carisoprodol Dermatitis   Percocet  [Oxycodone-Acetaminophen ] Dermatitis   Gluten Meal Hives and Nausea Only   Tramadol        Latest Ref Rng & Units 01/09/2024    5:15 AM 01/08/2024    5:00 PM 02/28/2023    9:50 AM  CBC  WBC 4.0 - 10.5 K/uL 8.3  10.3  5.0   Hemoglobin 12.0 - 15.0 g/dL 88.0  86.4  86.1   Hematocrit 36.0 - 46.0 % 34.9  38.8  40.5   Platelets 150 - 400 K/uL 267  319  292       CMP     Component Value Date/Time   NA 142 01/09/2024 0515   NA 138 02/28/2023 0950   K 3.6 01/09/2024 0515   CL 111 01/09/2024 0515   CO2 21 (L) 01/09/2024 0515   GLUCOSE 98 01/09/2024 0515   BUN 10 01/09/2024 0515   BUN 11 02/28/2023 0950   CREATININE 0.61 01/09/2024 0515   CALCIUM 8.6 (L) 01/09/2024 0515   PROT 6.4 (L) 01/08/2024 1700   PROT 6.6 02/28/2023 0950   ALBUMIN 4.1 01/08/2024 1700   ALBUMIN 4.5 02/28/2023 0950   AST 19 01/08/2024 1700   ALT 20 01/08/2024 1700   ALKPHOS 46 01/08/2024 1700   BILITOT 0.7 01/08/2024 1700   BILITOT 0.2 02/28/2023 0950   EGFR  100 02/28/2023 0950   GFRNONAA >60 01/09/2024 0515     VAS US  ABI WITH/WO TBI Result Date: 06/29/2024  LOWER EXTREMITY DOPPLER STUDY Patient Name:  Stacy West  Date of Exam:   06/28/2024 Medical Rec #: [id number]      Accession #:    [id number] Date of Birth: 03/26/1981       Patient Gender: F Patient Age:   10 years Exam Location:  [facility] Procedure:      VAS US  ABI WITH/WO TBI Referring Phys: Selinda Gu --------------------------------------------------------------------------------   Performing Technologist: Leafy Gibes RVS  Examination Guidelines: A complete evaluation includes at minimum, Doppler waveform signals and systolic blood pressure reading at the level of bilateral brachial, anterior tibial, and posterior tibial arteries, when vessel segments are accessible. Bilateral testing is considered  an integral part of a complete examination. Photoelectric Plethysmograph (PPG) waveforms and toe systolic pressure readings are included as required and additional duplex testing as needed. Limited examinations for reoccurring indications may be performed as noted.  ABI Findings: +---------+------------------+-----+---------+--------+ Right    Rt Pressure (mmHg)IndexWaveform Comment  +---------+------------------+-----+---------+--------+ Brachial 111                                      +---------+------------------+-----+---------+--------+ ATA      141               1.26 triphasic         +---------+------------------+-----+---------+--------+ PTA      133               1.19 triphasic         +---------+------------------+-----+---------+--------+ Great Toe150               1.34 Normal            +---------+------------------+-----+---------+--------+ +---------+------------------+-----+---------+-------+ Left     Lt Pressure (mmHg)IndexWaveform Comment +---------+------------------+-----+---------+-------+ Brachial 112                                     +---------+------------------+-----+---------+-------+ ATA      134               1.20 triphasic        +---------+------------------+-----+---------+-------+ PTA      155               1.38 triphasic        +---------+------------------+-----+---------+-------+ Great Toe142               1.27 Normal           +---------+------------------+-----+---------+-------+ +-------+-----------+-----------+------------+------------+ ABI/TBIToday's ABIToday's TBIPrevious ABIPrevious TBI +-------+-----------+-----------+------------+------------+ Right  1.26       1.34                                +-------+-----------+-----------+------------+------------+ Left   1.38       1.27                                +-------+-----------+-----------+------------+------------+  Summary: Right: Resting  right ankle-brachial index is within normal range. The right toe-brachial index is normal. Left: Resting left ankle-brachial index is within normal range. The left toe-brachial index is normal. *See table(s) above for measurements and observations.  Electronically  signed by Selinda Gu MD on 06/29/2024 at 8:46:50 AM.    Final        Assessment & Plan:   1. Pain in both lower legs (Primary) Patient presents to clinic today for follow-up related to an evaluation of her vascular system for her chronic lower extremity and neck pain.  Patient underwent carotid ultrasounds which were normal today bilaterally.  She has noted to have 1-39% stenosis on both sides.  No neurologic symptoms as well.  Patient also underwent arterial duplex ultrasounds and ABIs of her lower extremities.  These were all normal as well.  She had excellent ABIs with triphasic waveforms throughout.  She was instructed in the past to help with some of the numbness and tingling to her lower extremities as well as the pain to wear compression.  I recommend the same thing today.  We did have a long detailed discussion that her vascular system is not contributing to any of her pain or difficulties today.  Patient will follow-up as needed.  2. Gastroesophageal reflux disease with esophagitis, unspecified whether hemorrhage Continue PPI as already ordered, this medication has been reviewed and there are no changes at this time.  Avoidence of caffeine and alcohol  Moderate elevation of the head of the bed    Current Outpatient Medications on File Prior to Visit  Medication Sig Dispense Refill   cyanocobalamin  (VITAMIN B12) 1000 MCG/ML injection Inject 1 mL (1,000 mcg total) into the muscle every 30 (thirty) days. 30 mL 2   estradiol  (CLIMARA  - DOSED IN MG/24 HR) 0.1 mg/24hr patch Place 1 patch onto the skin.     ibuprofen (ADVIL) 200 MG tablet Take 200 mg by mouth as needed.     Levomefolate Glucosamine (METHYL-FOLATE PO) Take by mouth.      medroxyPROGESTERone  (PROVERA ) 10 MG tablet Take 10 mg by mouth daily. Per patient takes for 10 days then off for 2 weeks then repeats cycle     montelukast  (SINGULAIR ) 10 MG tablet TAKE 1 TABLET BY MOUTH EVERYDAY AT BEDTIME 90 tablet 0   pantoprazole  (PROTONIX ) 40 MG tablet TAKE 1 TABLET BY MOUTH EVERY DAY 90 tablet 3   albuterol  (VENTOLIN  HFA) 108 (90 Base) MCG/ACT inhaler Inhale 1-2 puffs into the lungs every 4 (four) hours as needed for wheezing or shortness of breath (cough). (Patient not taking: Reported on 06/29/2024) 8 g 0   cetirizine  (ZYRTEC ) 10 MG tablet TAKE 1 TABLET BY MOUTH EVERY DAY (Patient not taking: Reported on 06/29/2024) 90 tablet 1   Cholecalciferol  1.25 MG (50000 UT) TABS Take 1 tablet by mouth once a week. (Patient not taking: Reported on 06/29/2024) 12 tablet 4   cyclobenzaprine  (FLEXERIL ) 10 MG tablet Take 1 tablet (10 mg total) by mouth 3 (three) times daily as needed for muscle spasms. (Patient not taking: Reported on 06/29/2024) 270 tablet 4   DULoxetine  (CYMBALTA ) 30 MG capsule Take 30 mg by mouth daily. (Patient not taking: Reported on 06/29/2024)     guanFACINE  (INTUNIV ) 2 MG TB24 ER tablet Take 2 mg by mouth daily. (Patient not taking: Reported on 06/29/2024)     hydrocortisone  1 % ointment Apply 1 Application topically 2 (two) times daily. (Patient not taking: Reported on 06/29/2024) 30 g 0   levocetirizine (XYZAL  ALLERGY  24HR) 5 MG tablet Take 1 tablet (5 mg total) by mouth every evening for 10 days. (Patient not taking: Reported on 06/29/2024) 10 tablet 0   levocetirizine (XYZAL ) 5 MG tablet TAKE 1 TABLET BY MOUTH  EVERY DAY IN THE EVENING (Patient not taking: Reported on 06/29/2024) 90 tablet 1   metFORMIN  (GLUCOPHAGE -XR) 500 MG 24 hr tablet Take 4 tablets by mouth at bedtime. (Patient not taking: Reported on 06/29/2024)     NALTREXONE HCL PO Take 1.5 mg by mouth daily. (Patient not taking: Reported on 06/29/2024)     Omega-3 1000 MG CAPS Take 1 capsule by mouth every  morning. (Patient not taking: Reported on 06/29/2024)     pregabalin  (LYRICA ) 50 MG capsule Take 1 capsule (50 mg total) by mouth at bedtime. (Patient not taking: Reported on 06/29/2024) 90 capsule 2   SYRINGE-NEEDLE, DISP, 3 ML (B-D 3CC LUER-LOK SYR 20GX1) 20G X 1 3 ML MISC Use to inject B12 monthly intramuscular. (Patient not taking: Reported on 06/29/2024) 50 each 4   trazodone  (DESYREL ) 300 MG tablet Take 300 mg by mouth at bedtime. (Patient not taking: Reported on 06/29/2024)     No current facility-administered medications on file prior to visit.    There are no Patient Instructions on file for this visit. No follow-ups on file.   Gwendlyn JONELLE Shank, NP

## 2024-07-02 ENCOUNTER — Encounter: Payer: Self-pay | Admitting: Nurse Practitioner

## 2024-07-05 ENCOUNTER — Encounter: Payer: Self-pay | Admitting: Nurse Practitioner

## 2024-07-09 ENCOUNTER — Ambulatory Visit: Payer: Self-pay

## 2024-07-09 NOTE — Telephone Encounter (Signed)
 FYI to provider. Patient has appointment tomorrow.

## 2024-07-09 NOTE — Telephone Encounter (Signed)
 Noted

## 2024-07-09 NOTE — Telephone Encounter (Addendum)
 FYI Only or Action Required?: Action required by provider: Refusing disposition, requesting appt, warm transferred to CAL, speaking with CAL.  Patient was last seen in primary care on 03/12/2024 by Stacy Domino, FNP.  Called Nurse Triage reporting Neck Pain; Spasms; Vaginal Bleeding; SI joint instability worsening; burning pain in legs more often; Headache; vision changes with turning head; Shortness of Breath; and difficulty breathing, swallowing, talking, eating with muscle spasms.  Symptoms began pt will not specify, states progressively worsening.  Interventions attempted: Other: care plan for [facility].  Symptoms are: rapidly worsening.  Triage Disposition: Go to ED Now (or PCP Triage)  Patient/caregiver understands and will follow disposition?: No, refuses disposition      Copied from CRM #[id number]. Topic: Clinical - Red Word Triage >> Jul 09, 2024  9:20 AM Stacy West wrote: Red Word that prompted transfer to Nurse Triage: Patient is having pain in her fi joint, left side is not stable at all, c4 she believes keeps popping out as well which causes pain and causing muscle spasms in head and neck. Reason for Disposition  Patient sounds very sick or weak to the triager  Answer Assessment - Initial Assessment Questions 1. ONSET: When did the pain begin?      Same location as always but getting worse, usually pain 2/10 now a 5/10 More SI joint Causing me to bleed vaginally Causing discs in neck to move, muscle spasms in throat so issues with talking, eating, and swallowing Now starting to happen even when not straining myself so lot more often 2. LOCATION: Where does it hurt?      Instability due to chronic illness 3. PATTERN Does the pain come and go, or has it been constant since it started?      constant 4. SEVERITY: How bad is the pain?  (Scale 0-10; or none or slight stiffness, mild, moderate, severe)     Now 5/10 5. RADIATION: Does the pain go anywhere else,  shoot into your arms?     Mostly all my back and into butt and hips, knee starting to act up too, think leg is dropping because problems lifting it over things, catching on things, heavier 6. CORD SYMPTOMS: Any weakness or numbness of the arms or legs?     Yes comes and goes, lot more burning pain in legs now, saying fibromyalgia 7. CAUSE: What do you think is causing the neck pain?     Her chronic condition 9. OTHER SYMPTOMS: Do you have any other symptoms? (e.g., headache, fever, chest pain, difficulty breathing, neck swelling)     Lot of headache, no fever, no chest pain, SOB through mouth and with head lot of pressure and muscle tightness, able to get chin to chest but hurts, can't turn head all the way, when turn head eye sight gets splotchy  Been happening for a long time, ED gonna see me as pain-seeking, not looking to get strip-searched today Not a flare-up, it's worse progressively Nurse explained that nurse asking if noticed acute issue in past couple days/weeks or if  Referred me to pain management, Stacy West Been pushing myself, getting to point where not able to use wheelchair, probably will need electric wheelchair, will need pain management for family vacation   ED not gonna know me, not gonna  She knows, she knows, spoke to her, she knows all of this, said to call and make appt, now being told to go to ED, bit disconcerting  Called CAL and alerted to situation,  CAL willing to speak to pt to confirm plan for nurse to send message for provider team to call pt back Nurse reiterated advise for pt go to hospital if any worsening and front desk on line to help her further Pt arguing that symptoms all because of muscle spasms though, all because of the fucking Stacy West, ED not going to help anything, pt upset Nurse stated again that front desk on line and can help her further, gave well wishes again   Advised pt go to hospital for symptoms, pt refusing, advised that  nurse sending message to PCP for call back with further recommendations, pt refusing this plan as well, requesting appt, connected to CAL, CAL speaking to pt further. Advised pt go to hospital if any worsening, pt refusing and upset.  Protocols used: Neck Pain or Stiffness-A-AH

## 2024-07-10 ENCOUNTER — Encounter: Payer: Self-pay | Admitting: Nurse Practitioner

## 2024-07-10 ENCOUNTER — Ambulatory Visit (INDEPENDENT_AMBULATORY_CARE_PROVIDER_SITE_OTHER): Admitting: Nurse Practitioner

## 2024-07-10 VITALS — BP 93/65 | HR 91 | Temp 98.8°F | Ht 71.0 in | Wt 235.8 lb

## 2024-07-10 DIAGNOSIS — E538 Deficiency of other specified B group vitamins: Secondary | ICD-10-CM

## 2024-07-10 DIAGNOSIS — Z7409 Other reduced mobility: Secondary | ICD-10-CM | POA: Diagnosis not present

## 2024-07-10 DIAGNOSIS — Q7962 Hypermobile Ehlers-Danlos syndrome: Secondary | ICD-10-CM | POA: Diagnosis not present

## 2024-07-10 DIAGNOSIS — E669 Obesity, unspecified: Secondary | ICD-10-CM

## 2024-07-10 DIAGNOSIS — M797 Fibromyalgia: Secondary | ICD-10-CM

## 2024-07-10 DIAGNOSIS — I9589 Other hypotension: Secondary | ICD-10-CM | POA: Diagnosis not present

## 2024-07-10 DIAGNOSIS — I959 Hypotension, unspecified: Secondary | ICD-10-CM | POA: Insufficient documentation

## 2024-07-10 MED ORDER — HYDROMORPHONE HCL 2 MG PO TABS: 2.0000 mg | ORAL_TABLET | Freq: Two times a day (BID) | ORAL | 0 refills | Status: AC | PRN

## 2024-07-10 MED ORDER — CYANOCOBALAMIN 1000 MCG/ML IJ SOLN: 1000.0000 ug | INTRAMUSCULAR | 2 refills | Status: AC

## 2024-07-10 MED ORDER — BD LUER-LOK SYRINGE 20G X 1" 3 ML MISC: 4 refills | Status: AC

## 2024-07-10 MED ORDER — SCOPOLAMINE 1 MG/3DAYS TD PT72: 1.0000 | MEDICATED_PATCH | TRANSDERMAL | 0 refills | Status: AC

## 2024-07-10 NOTE — Assessment & Plan Note (Signed)
BMI 32.89.  Recommended eating smaller high protein, low fat meals more frequently and exercising 30 mins a day 5 times a week with a goal of 10-15lb weight loss in the next 3 months. Patient voiced their understanding and motivation to adhere to these recommendations. ? ?

## 2024-07-10 NOTE — Progress Notes (Signed)
 BP 93/65   Pulse 91   Temp 98.8 F (37.1 C) (Oral)   Ht 5' 11 (1.803 m)   Wt 235 lb 12.8 oz (107 kg)   SpO2 98%   BMI 32.89 kg/m    Subjective:    Patient ID: Stacy West, female    DOB: February 25, 1981, 43 y.o.   MRN: [id number]  HPI: Stacy West is a 43 y.o. female  Chief Complaint  Patient presents with   Pain    Patient states she is here to discuss the pain in her SI joint, states she is wanting to get a motorized wheelchair   Feels fatigued all the time due to low BP and is hydrating well. Feels very washed out and fatigued.  CHRONIC PAIN  Saw pain clinic on 06/28/24 for her Mertha Bullock and fibromyalgia pain.  She reports frustration with visit as was offered injections and does not feel these are needed.  Would like medication to take as needed.  Is going on a cruise soon and concerned about pain.  She is also wanting to get a motorized wheelchair as her mobility is worsening she reports.  Saw vascular on 06/29/24 due to pain in legs. SI joint pain, L>R, and she reports more pain in legs with this and having foot issues.  Continues to see chiropractor who will put SI joint back into place.  Gabapentin, Lyrica , Cymbalta , and muscle relaxer without benefit.  Has been to physical therapy at [facility] in past, < 4 years ago.  Does the exercises at home still. Had MRI cervical and thoracic spine on 08/20/23. Leaves on the 21st on September for [place] and returns on the 28th from [place] .    Cannot take Percocet or Tramadol due to allergic reactions.  Has taken Dilaudid  in past with benefit, last in 2021 from the ER. She reports being able to take this.  Reports that her current SW from [place] is working with her on coverage for treatments too. Would benefit from medical massage and trigger point massage due to her Mertha Bullock and chronic pain.  Has Ehlers Danlos and fibromyalgia. Is becoming more difficult to ambulate longer distances and needs a motorized wheelchair to so when travels  to places like [place] can be involved.  Patient can transfer independently.  A manual w/c is difficult to use for her to utilize as has one at home and upper body becomes fatigued easily and neck pain/headaches present. She enjoys being active to her highest level and manual w/c decreases her current independence.  Would be dependent on her motorized wheelchair to assist with ADLs. She has control of upper body and is able to maneuver a motorized wheelchair + cognitively understands how to safely use the motorized wheelchair.  Without a motorized wheelchair her MRADLs would be significantly affected, as she would be unable to transfer to areas in her home to cook and bath + would not be able to attend provider appointments to assess her overall health + travel for necessary errands Pain control status: uncontrolled Duration: chronic Location: SI joints and into legs/ burning Quality: dull, aching, stabbing, and throbbing -- numb in hands and feet Current Pain Level: 5/10 Previous Pain Level: 2/10 Breakthrough pain: no Stool softners/OTC fiber: no  Previous pain specialty evaluation: yes Non-narcotic analgesic meds: yes as above Narcotic contract: no   Relevant past medical, surgical, family and social history reviewed and updated as indicated. Interim medical history since our last visit reviewed. Allergies and medications reviewed and  updated.  Review of Systems  Constitutional:  Positive for fatigue. Negative for activity change, appetite change, diaphoresis and fever.  Respiratory:  Negative for cough, chest tightness, shortness of breath and wheezing.   Cardiovascular:  Negative for chest pain, palpitations and leg swelling.  Gastrointestinal: Negative.   Musculoskeletal:  Positive for arthralgias, back pain and neck stiffness.  Neurological: Negative.   Psychiatric/Behavioral: Negative.      Per HPI unless specifically indicated above     Objective:    BP 93/65   Pulse 91   Temp  98.8 F (37.1 C) (Oral)   Ht 5' 11 (1.803 m)   Wt 235 lb 12.8 oz (107 kg)   SpO2 98%   BMI 32.89 kg/m   Wt Readings from Last 3 Encounters:  07/10/24 235 lb 12.8 oz (107 kg)  06/29/24 234 lb 6.4 oz (106.3 kg)  06/28/24 238 lb (108 kg)    Physical Exam Vitals and nursing note reviewed.  Constitutional:      General: She is awake. She is not in acute distress.    Appearance: She is well-developed and well-groomed. She is obese. She is not ill-appearing or toxic-appearing.  HENT:     Head: Normocephalic.     Right Ear: Hearing and external ear normal.     Left Ear: Hearing and external ear normal.  Eyes:     General: Lids are normal.        Right eye: No discharge.        Left eye: No discharge.     Conjunctiva/sclera: Conjunctivae normal.     Pupils: Pupils are equal, round, and reactive to light.  Neck:     Thyroid : No thyromegaly.     Vascular: No carotid bruit.  Cardiovascular:     Rate and Rhythm: Normal rate and regular rhythm.     Heart sounds: Normal heart sounds. No murmur heard.    No gallop.  Pulmonary:     Effort: Pulmonary effort is normal. No accessory muscle usage or respiratory distress.     Breath sounds: Normal breath sounds.  Abdominal:     General: Bowel sounds are normal. There is no distension.     Palpations: Abdomen is soft.     Tenderness: There is no abdominal tenderness.  Musculoskeletal:     Right upper arm: Normal.     Left upper arm: Normal.     Right hand: Normal.     Left hand: Normal.     Cervical back: Normal range of motion and neck supple.     Right lower leg: No edema.     Left lower leg: No edema.  Lymphadenopathy:     Cervical: No cervical adenopathy.  Skin:    General: Skin is warm and dry.  Neurological:     Mental Status: She is alert and oriented to person, place, and time.     Deep Tendon Reflexes: Reflexes are normal and symmetric.     Reflex Scores:      Brachioradialis reflexes are 2+ on the right side and 2+ on the  left side.      Patellar reflexes are 2+ on the right side and 2+ on the left side. Psychiatric:        Attention and Perception: Attention normal.        Mood and Affect: Mood normal.        Speech: Speech normal.        Behavior: Behavior normal. Behavior is cooperative.  Thought Content: Thought content normal.     Results for orders placed or performed in visit on 06/28/24  VAS US  ABI WITH/WO TBI   Collection Time: 06/28/24  8:37 AM  Result Value Ref Range   Right ABI 1.26    Left ABI 1.38       Assessment & Plan:   Problem List Items Addressed This Visit       Cardiovascular and Mediastinum   Hypotension   Suspect related to her chronic disease processes, ?underlying POTS.  Would benefit from Midodrine  or Florinef, but unsure able to take due to current allergies. Will reach out to patient on this.  Continue to drink plenty of water daily.  Recommend compression hose on during day and off at night.        Musculoskeletal and Integument   Ehlers-Danlos syndrome type III - Primary   Chronic, ongoing .  Continue collaboration with ortho, pain management, & cardiology.  At this time continue medication regimen as ordered by specialists.  Will send short burst of Dilaudid  for upcoming travel and pain, however she is aware this is short burst and no refills.  Place referral to [facility] pain management, she would like to visit with them. Continue chiropractor care and will work on getting medical and trigger massage. Order for motorized wheelchair sent to medical supply, unsure will be covered and made her aware she may need to perform PT prior to obtaining.      Relevant Orders   DME Wheelchair electric   Ambulatory referral to Pain Clinic     Other   Poor mobility   Due to [facility] and fibromyalgia. Continue chiropractor care and will work on getting medical and trigger massage. Order for motorized wheelchair sent to medical supply, unsure will be covered and made her  aware she may need to perform PT prior to obtaining. Would benefit from this to help with quality of life and ADLs.      Obesity (BMI 30-39.9)   BMI 32.89. Recommended eating smaller high protein, low fat meals more frequently and exercising 30 mins a day 5 times a week with a goal of 10-15lb weight loss in the next 3 months. Patient voiced their understanding and motivation to adhere to these recommendations.       Fibromyalgia   Chronic, ongoing, with Mertha Bullock.  Refer to Mertha Bullock plan of care.      Relevant Medications   HYDROmorphone  (DILAUDID ) 2 MG tablet (Start on 08/02/2024)   Other Relevant Orders   Ambulatory referral to Pain Clinic   B12 deficiency   Chronic, ongoing.  She has not had benefit from oral supplement.  Send in injections for her to perform at home, she is comfortable with doing these as provides injections to her husband and has performed at home in past.       Time: 30 minutes, >50% spent counseling/or care coordination   Follow up plan: Return for as scheduled in future.

## 2024-07-10 NOTE — Assessment & Plan Note (Signed)
 Due to [facility] and fibromyalgia. Continue chiropractor care and will work on getting medical and trigger massage. Order for motorized wheelchair sent to medical supply, unsure will be covered and made her aware she may need to perform PT prior to obtaining. Would benefit from this to help with quality of life and ADLs.

## 2024-07-10 NOTE — Assessment & Plan Note (Signed)
 Suspect related to her chronic disease processes, ?underlying POTS.  Would benefit from Midodrine  or Florinef, but unsure able to take due to current allergies. Will reach out to patient on this.  Continue to drink plenty of water daily.  Recommend compression hose on during day and off at night.

## 2024-07-10 NOTE — Assessment & Plan Note (Signed)
Refer to Lorinda Creed plan of care.

## 2024-07-10 NOTE — Assessment & Plan Note (Signed)
 Chronic, ongoing.  She has not had benefit from oral supplement.  Send in injections for her to perform at home, she is comfortable with doing these as provides injections to her husband and has performed at home in past.

## 2024-07-10 NOTE — Assessment & Plan Note (Signed)
 Chronic, ongoing .  Continue collaboration with ortho, pain management, & cardiology.  At this time continue medication regimen as ordered by specialists.  Will send short burst of Dilaudid  for upcoming travel and pain, however she is aware this is short burst and no refills.  Place referral to [facility] pain management, she would like to visit with them. Continue chiropractor care and will work on getting medical and trigger massage. Order for motorized wheelchair sent to medical supply, unsure will be covered and made her aware she may need to perform PT prior to obtaining.

## 2024-07-10 NOTE — Patient Instructions (Addendum)
 Dilaudid , Scopolamine  patches, and B12 injections sent to pharmacy.   Chronic Pain, Adult Chronic pain is a type of pain that lasts or keeps coming back for at least 3-6 months. You may have headaches, pain in the abdomen, or pain in other areas of the body. Chronic pain may be related to an illness, injury, or a health condition. Sometimes, the cause of chronic pain is not known. Chronic pain can make it hard for you to do daily activities. If it is not treated, chronic pain can lead to anxiety and depression. Treatment depends on the cause of your pain and how severe it is. You may need to work with a pain specialist to come up with a treatment plan. Many people benefit from two or more types of treatment to control their pain. Follow these instructions at home: Treatment plan Follow your treatment plan as told by your health care provider. This may include: Gentle, regular exercise. Eating a healthy diet that includes foods such as vegetables, fruits, fish, and lean meats. Mental health therapy (cognitive or behavioral therapy) that changes the way you think or act in response to the pain. This may help improve how you feel. Doing physical therapy exercises to improve movement and strength. Meditation, yoga, acupuncture, or massage therapy. Using the oils from plants in your environment or on your skin (aromatherapy). Other treatments may include: Over-the-counter or prescription medicines. Color, light, or sound therapy. Local electrical stimulation. The electrical pulses help to relieve pain by temporarily stopping the nerve impulses that cause you to feel pain. Injections. These deliver numbing or pain-relieving medicines into the spine or the area of pain.  Medicines Take over-the-counter and prescription medicines only as told by your health care provider. Ask your health care provider if the medicine prescribed to you: Requires you to avoid driving or using machinery. Can cause  constipation. You may need to take these actions to prevent or treat constipation: Drink enough fluid to keep your urine pale yellow. Take over-the-counter or prescription medicines. Eat foods that are high in fiber, such as beans, whole grains, and fresh fruits and vegetables. Limit foods that are high in fat and processed sugars, such as fried or sweet foods. Lifestyle  Ask your health care provider whether you should keep a pain diary. Your health care provider will tell you what information to write in the diary. This may include: When you have pain. What the pain feels like. How medicines and other behaviors or treatments help to reduce the pain. Consider talking with a mental health care provider about how to help manage chronic pain. Consider joining a chronic pain support group. Try to control or lower your stress levels. Talk with your health care provider about ways to do this. General instructions Learn as much as you can about how to manage your chronic pain. Ask your health care provider if an intensive pain rehabilitation program or a chronic pain specialist would be helpful. Check your pain level as told by your health care provider. Ask your health care provider if you should use a pain scale. Contact a health care provider if: Your pain is not controlled with treatment. You have new pain. You have side effects from pain medicine. You feel weak or you have trouble doing your normal activities. You have trouble sleeping or you develop confusion. You lose feeling or have numbness in your body. You lose control of your bowels or bladder. Get help right away if: Your pain suddenly gets much worse.  You develop chest pain. You have trouble breathing or shortness of breath. You faint, or another person sees you faint. These symptoms may be an emergency. Get help right away. Call 911. Do not wait to see if the symptoms will go away. Do not drive yourself to the  hospital. Also, get help right away if: You have thoughts about hurting yourself or others. Take one of these steps if you feel like you may hurt yourself or others, or have thoughts about taking your own life: Go to your nearest emergency room. Call 911. Call the National Suicide Prevention Lifeline at (430)203-9366 or 988. This is open 24 hours a day. Text the Crisis Text Line at 251-530-1743. This information is not intended to replace advice given to you by your health care provider. Make sure you discuss any questions you have with your health care provider. Document Revised: 06/23/2022 Document Reviewed: 05/26/2022 Elsevier Patient Education  2024 [employer].

## 2024-07-10 NOTE — Assessment & Plan Note (Signed)
 Chronic, ongoing, with Stacy West.  Refer to Stacy West plan of care.

## 2024-07-12 MED ORDER — MIDODRINE HCL 2.5 MG PO TABS: 2.5000 mg | ORAL_TABLET | Freq: Two times a day (BID) | ORAL | 2 refills | Status: AC

## 2024-07-15 DIAGNOSIS — K900 Celiac disease: Secondary | ICD-10-CM | POA: Diagnosis not present

## 2024-07-15 DIAGNOSIS — R2689 Other abnormalities of gait and mobility: Secondary | ICD-10-CM | POA: Diagnosis not present

## 2024-07-15 DIAGNOSIS — G909 Disorder of the autonomic nervous system, unspecified: Secondary | ICD-10-CM | POA: Diagnosis not present

## 2024-07-15 DIAGNOSIS — Q796 Ehlers-Danlos syndrome, unspecified: Secondary | ICD-10-CM | POA: Diagnosis not present

## 2024-07-15 DIAGNOSIS — M797 Fibromyalgia: Secondary | ICD-10-CM | POA: Diagnosis not present

## 2024-07-17 DIAGNOSIS — G909 Disorder of the autonomic nervous system, unspecified: Secondary | ICD-10-CM | POA: Diagnosis not present

## 2024-07-17 DIAGNOSIS — Q796 Ehlers-Danlos syndrome, unspecified: Secondary | ICD-10-CM | POA: Diagnosis not present

## 2024-07-17 DIAGNOSIS — R2689 Other abnormalities of gait and mobility: Secondary | ICD-10-CM | POA: Diagnosis not present

## 2024-07-17 DIAGNOSIS — K900 Celiac disease: Secondary | ICD-10-CM | POA: Diagnosis not present

## 2024-07-17 DIAGNOSIS — M797 Fibromyalgia: Secondary | ICD-10-CM | POA: Diagnosis not present

## 2024-07-19 ENCOUNTER — Encounter: Payer: Self-pay | Admitting: Nurse Practitioner

## 2024-07-22 ENCOUNTER — Encounter: Payer: Self-pay | Admitting: Nurse Practitioner

## 2024-07-22 DIAGNOSIS — K622 Anal prolapse: Secondary | ICD-10-CM

## 2024-07-24 ENCOUNTER — Other Ambulatory Visit: Payer: Self-pay

## 2024-07-24 ENCOUNTER — Emergency Department

## 2024-07-24 ENCOUNTER — Encounter: Payer: Self-pay | Admitting: Nurse Practitioner

## 2024-07-24 ENCOUNTER — Emergency Department
Admission: EM | Admit: 2024-07-24 | Discharge: 2024-07-25 | Disposition: A | Discharge status: Home / Self Care | Attending: Emergency Medicine | Admitting: Emergency Medicine

## 2024-07-24 ENCOUNTER — Telehealth: Payer: Self-pay

## 2024-07-24 DIAGNOSIS — M797 Fibromyalgia: Secondary | ICD-10-CM | POA: Insufficient documentation

## 2024-07-24 DIAGNOSIS — G8929 Other chronic pain: Secondary | ICD-10-CM | POA: Insufficient documentation

## 2024-07-24 DIAGNOSIS — E282 Polycystic ovarian syndrome: Secondary | ICD-10-CM | POA: Diagnosis present

## 2024-07-24 DIAGNOSIS — K623 Rectal prolapse: Secondary | ICD-10-CM | POA: Diagnosis present

## 2024-07-24 DIAGNOSIS — N83291 Other ovarian cyst, right side: Secondary | ICD-10-CM | POA: Insufficient documentation

## 2024-07-24 DIAGNOSIS — R100 Acute abdomen: Secondary | ICD-10-CM | POA: Diagnosis present

## 2024-07-24 DIAGNOSIS — R109 Unspecified abdominal pain: Secondary | ICD-10-CM | POA: Insufficient documentation

## 2024-07-24 DIAGNOSIS — F419 Anxiety disorder, unspecified: Secondary | ICD-10-CM | POA: Insufficient documentation

## 2024-07-24 DIAGNOSIS — K6289 Other specified diseases of anus and rectum: Secondary | ICD-10-CM | POA: Diagnosis not present

## 2024-07-24 DIAGNOSIS — Q796 Ehlers-Danlos syndrome, unspecified: Secondary | ICD-10-CM | POA: Insufficient documentation

## 2024-07-24 LAB — COMPREHENSIVE METABOLIC PANEL WITH GFR
ALT: 12 U/L (ref 0–44)
AST: 15 U/L (ref 15–41)
Albumin: 4.2 g/dL (ref 3.5–5.0)
Alkaline Phosphatase: 58 U/L (ref 38–126)
Anion gap: 7 (ref 5–15)
BUN: 12 mg/dL (ref 6–20)
CO2: 22 mmol/L (ref 22–32)
Calcium: 9.2 mg/dL (ref 8.9–10.3)
Chloride: 111 mmol/L (ref 98–111)
Creatinine, Ser: 0.7 mg/dL (ref 0.44–1.00)
GFR, Estimated: >60 mL/min (ref >60)
Glucose, Bld: 83 mg/dL (ref 70–99)
Potassium: 4 mmol/L (ref 3.5–5.1)
Sodium: 140 mmol/L (ref 135–145)
Total Bilirubin: 0.6 mg/dL (ref 0.0–1.2)
Total Protein: 7.3 g/dL (ref 6.5–8.1)

## 2024-07-24 LAB — HCG, QUANTITATIVE, PREGNANCY: hCG, Beta Chain, Quant, S: <1 m[IU]/mL (ref <5)

## 2024-07-24 LAB — URINALYSIS, ROUTINE W REFLEX MICROSCOPIC
Bilirubin Urine: NEGATIVE
Glucose, UA: NEGATIVE mg/dL
Hgb urine dipstick: NEGATIVE
Ketones, ur: NEGATIVE mg/dL
Leukocytes,Ua: NEGATIVE
Nitrite: NEGATIVE
Protein, ur: NEGATIVE mg/dL
Specific Gravity, Urine: 1.027 (ref 1.005–1.030)
pH: 5 (ref 5.0–8.0)

## 2024-07-24 LAB — CBC
HCT: 42.2 % (ref 36.0–46.0)
Hemoglobin: 14.3 g/dL (ref 12.0–15.0)
MCH: 31.4 pg (ref 26.0–34.0)
MCHC: 33.9 g/dL (ref 30.0–36.0)
MCV: 92.5 fL (ref 80.0–100.0)
Platelets: 342 K/uL (ref 150–400)
RBC: 4.56 MIL/uL (ref 3.87–5.11)
RDW: 12.9 % (ref 11.5–15.5)
WBC: 9.5 K/uL (ref 4.0–10.5)
nRBC: 0 % (ref 0.0–0.2)

## 2024-07-24 LAB — LIPASE, BLOOD: Lipase: 37 U/L (ref 11–51)

## 2024-07-24 MED ORDER — HYDROCORTISONE ACETATE 25 MG RE SUPP: 25.0000 mg | Freq: Two times a day (BID) | RECTAL | 0 refills | Status: AC

## 2024-07-24 MED ORDER — DICYCLOMINE HCL 20 MG PO TABS: 20.0000 mg | ORAL_TABLET | Freq: Four times a day (QID) | ORAL | 0 refills | Status: AC | PRN

## 2024-07-24 MED ORDER — KETOROLAC TROMETHAMINE 15 MG/ML IJ SOLN
15.0000 mg | Freq: Once | INTRAMUSCULAR | Status: AC
  Administered 2024-07-24: 15 mg via INTRAVENOUS
  Filled 2024-07-24: qty 1

## 2024-07-24 MED ORDER — CEPHALEXIN 500 MG PO CAPS
500.0000 mg | ORAL_CAPSULE | Freq: Once | ORAL | Status: AC
  Administered 2024-07-24: 500 mg via ORAL
  Filled 2024-07-24: qty 1

## 2024-07-24 MED ORDER — CEPHALEXIN 500 MG PO CAPS: 500.0000 mg | ORAL_CAPSULE | Freq: Three times a day (TID) | ORAL | 0 refills | Status: DC

## 2024-07-24 MED ORDER — DICYCLOMINE HCL 10 MG/ML IM SOLN
20.0000 mg | Freq: Once | INTRAMUSCULAR | Status: AC
  Administered 2024-07-24: 20 mg via INTRAMUSCULAR
  Filled 2024-07-24 (×2): qty 2

## 2024-07-24 MED ORDER — PREDNISONE 20 MG PO TABS
40.0000 mg | ORAL_TABLET | ORAL | Status: AC
  Administered 2024-07-24: 40 mg via ORAL
  Filled 2024-07-24: qty 2

## 2024-07-24 MED ORDER — HYDROMORPHONE HCL 2 MG PO TABS: 2.0000 mg | ORAL_TABLET | ORAL | Status: DC

## 2024-07-24 MED ORDER — PREDNISONE 20 MG PO TABS: 40.0000 mg | ORAL_TABLET | Freq: Every day | ORAL | 0 refills | Status: AC

## 2024-07-24 MED ORDER — IOHEXOL 350 MG/ML SOLN
100.0000 mL | Freq: Once | INTRAVENOUS | Status: AC | PRN
  Administered 2024-07-24: 100 mL via INTRAVENOUS

## 2024-07-24 MED ORDER — HYDROCORTISONE ACETATE 25 MG RE SUPP
25.0000 mg | Freq: Once | RECTAL | Status: DC
  Filled 2024-07-24: qty 1

## 2024-07-24 NOTE — ED Provider Notes (Signed)
 [facility] Provider Note    Event Date/Time   First MD Initiated Contact with Patient 07/24/24 1839     (approximate)   History   Chief Complaint: rectal prolapse   HPI  Stacy West is a 43 y.o. female with a history of anxiety, chronic pain, fibromyalgia, Ehlers-Danlos, PCOS who comes ED complaining of severe rectal pain, concern for rectal prolapse.  Reviewed outside records noting that primary care has referred her to colorectal surgery.  She was seen by pain management recently but they determined that they were not able to offer her any interventions that would be effective.        Past Medical History:  Diagnosis Date   Abnormal Pap smear of cervix    Allergy     Anxiety    Arthritis    Celiac disease    Clotting disorder (HCC) 2019   Bled 6 months post partum   Depression    Ehlers-Danlos disease    Fibromyalgia    Heart murmur    Insulin  resistance    Miscarriage    Other cervical disc degeneration at C4-C5 level    PCOS (polycystic ovarian syndrome)    PCOS (polycystic ovarian syndrome)    Premature ovarian failure    Recurrent upper respiratory infection (URI)    SI (sacroiliac) joint dysfunction    Sleep apnea    Thyroid  disease     Current Outpatient Rx   Order #: [id number] Class: Normal   Order #: [id number] Class: Normal   Order #: [id number] Class: Normal   Order #: [id number] Class: Normal   Order #: [id number] Class: Normal   Order #: [id number] Class: Normal   Order #: [id number] Class: Normal   Order #: [id number] Class: Normal   Order #: [id number] Class: Normal   Order #: [id number] Class: Historical Med   Order #: [id number] Class: Historical Med   Order #: [id number] Class: Historical Med   Order #: [id number] Class: Normal   [START ON 08/02/2024] Order #: [id number] Class: Normal   Order #: [id number] Class: Historical Med   Order #: [id number] Class: Normal   Order #: [id number] Class: Normal   Order #: [id number] Class: Historical  Med   Order #: [id number] Class: Historical Med   Order #: [id number] Class: Historical Med   Order #: [id number] Class: Normal   Order #: [id number] Class: Normal   Order #: [id number] Class: Historical Med   Order #: [id number] Class: Normal   Order #: [id number] Class: Normal   Order #: [id number] Class: Historical Med   Order #: [id number] Class: Historical Med   Order #: [id number] Class: Normal   Order #: [id number] Class: Normal   Order #: [id number] Class: Historical Med    Past Surgical History:  Procedure Laterality Date   ADENOIDECTOMY     BACK SURGERY     COLPOSCOPY     FINGER SURGERY Left    NECK SURGERY  07/2020   SPINE SURGERY  2008 2019   TONSILLECTOMY     WISDOM TOOTH EXTRACTION      Physical Exam   Triage Vital Signs: ED Triage Vitals  Encounter Vitals Group     BP 07/24/24 1515 (!) 123/90     Girls Systolic BP Percentile --      Girls Diastolic BP Percentile --      Boys Systolic BP Percentile --      Boys Diastolic BP Percentile --      Pulse Rate 07/24/24 1515 94     Resp 07/24/24 1515 18     Temp 07/24/24 1515 (!) 100.4 F (38 C)     Temp Source  07/24/24 1515 Oral     SpO2 07/24/24 1515 98 %     Weight 07/24/24 1516 230 lb (104.3 kg)     Height 07/24/24 1516 5' 11 (1.803 m)     Head Circumference --      Peak Flow --      Pain Score 07/24/24 1528 8     Pain Loc --      Pain Education --      Exclude from Growth Chart --     Most recent vital signs: Vitals:   07/24/24 1515  BP: (!) 123/90  Pulse: 94  Resp: 18  Temp: (!) 100.4 F (38 C)  SpO2: 98%    General: Awake, no distress.  CV:  Good peripheral perfusion.  Resp:  Normal effort. ctab Abd:  No distention. Soft with lower abd tenderness. External anal exam performed with nurse Ozell at bedside, essentially normal. No prolapse. There are 2 small external hemorrhoids that are not thrombosed or bleeding or engorged.  Other:  Moist oral mucosa. Anxious. Nontoxic.   ED Results / Procedures /  Treatments   Labs (all labs ordered are listed, but only abnormal results are displayed) Labs Reviewed  URINALYSIS, ROUTINE W REFLEX MICROSCOPIC - Abnormal; Notable for the following components:      Result Value   Color, Urine YELLOW (*)    APPearance HAZY (*)    All other components within normal limits  LIPASE, BLOOD  COMPREHENSIVE METABOLIC PANEL WITH GFR  CBC  HCG, QUANTITATIVE, PREGNANCY  POC URINE PREG, ED     EKG    RADIOLOGY CT abdomen pelvis interpreted by me, no evidence of bowel obstruction or intussusception.  Radiology report reviewed   PROCEDURES:  Procedures   MEDICATIONS ORDERED IN ED: Medications  dicyclomine  (BENTYL ) injection 20 mg (has no administration in time range)  hydrocortisone  (ANUSOL -HC) suppository 25 mg (has no administration in time range)  predniSONE  (DELTASONE ) tablet 40 mg (has no administration in time range)  cephALEXin  (KEFLEX ) capsule 500 mg (has no administration in time range)  ketorolac  (TORADOL ) 15 MG/ML injection 15 mg (15 mg Intravenous Given 07/24/24 1950)  iohexol  (OMNIPAQUE ) 350 MG/ML injection 100 mL (100 mLs Intravenous Contrast Given 07/24/24 2203)     IMPRESSION / MDM / ASSESSMENT AND PLAN / ED COURSE  I reviewed the triage vital signs and the nursing notes.  DDx: Diverticulitis, bowel obstruction, internal hernia, UTI, AKI  Patient's presentation is most consistent with acute presentation with potential threat to life or bodily function.  Patient presents with anal pain which she states has been severe for the past 4 days.  On the first day she had some bright red blood per rectum but that resolved.  No other complaints.  She has been referred to a colorectal surgeon by her primary care doctor.  She has been evaluated by pain management.  She has seen gastroenterology in the past.  Last colonoscopy 2021.  Suspect this is due to hemorrhoids.  CT and labs are normal.  Does have a 4 cm ovarian cyst, discussed this with the  patient, she does report that she has been referred to gynecology.  Will treat with course of steroids, Keflex  for low-grade fever without a clear cause, and Bentyl  for spasm relief       FINAL CLINICAL IMPRESSION(S) / ED DIAGNOSES   Final diagnoses:  Abdominal pain, unspecified abdominal location  Proctalgia     Rx / DC Orders   ED Discharge Orders  Ordered    hydrocortisone  (ANUSOL -HC) 25 MG suppository  Every 12 hours        07/24/24 2255    dicyclomine  (BENTYL ) 20 MG tablet  Every 6 hours PRN        07/24/24 2255    predniSONE  (DELTASONE ) 20 MG tablet  Daily with breakfast        07/24/24 2255    cephALEXin  (KEFLEX ) 500 MG capsule  3 times daily        07/24/24 2255             Note:  This document was prepared using Dragon voice recognition software and may include unintentional dictation errors.   Viviann Pastor, MD 07/24/24 [phone]

## 2024-07-24 NOTE — ED Notes (Signed)
 Patient upset with this RN and demanded a new Charity fundraiser.  Pt felt that this RN was making jokes about my pain and smiling too much.   RN never joked about patient's pain.  When RN informed patient that she was going to be administering pain medication, pt stated good causes its a real pain in the ass.  RN was attempting to comfort patient during conversation and patient was informing this RN about her hx.  Pt informed RN that she had blown out her ass hole by eating too many carbs due to recent stress level. Pt stated that the only thing that has been helping with my pain is ice and I don't have that here.  RN offered an ice pack and patient stated that would help her pain.  RN was only able to find large ice pack in department.  After bringing patient the only size ice pack that the department had, patient felt that this RN was making a joke by bringing a large ice pack.  Pt yelled and demeaned a new nurse.  RN apologized and informed primary RN of situation.

## 2024-07-24 NOTE — ED Notes (Signed)
 Pt in hallway speaking loudly and insisting on immediate medication administration, IV catheter removal, and discharge paperwork/education. Pt states transportation has arrived and is waiting to take pt home. Available medications dispensed and pt redirected to room. Informed pt that suppository was not readily available in med cart and has to be dispensed from facility pharmacy. Pt insists on administration of available meds and refuses to wait for RN to check tube station for medication. Advised pt that this RN understood pts reason for urgency but reassured pt that the RN would maintain standards of care, regardless of pt attempts to rush RN interventions. While scanning medications, pt asks about IV catheter removal and then, without warning, removes IV catheter by self. Catheter intact after removal. Site begins to bleed and 4-5 drops get on floor before pt applies bandage and pressure to site. Pt then attempts to clean blood spot on floor with paper towels. Throughout these interactions, pt is walking all over room, repeatedly going through RN supplies for med administration, and yelling about details of today's visit. Pt ambulated towards discharge with steady gait.

## 2024-07-24 NOTE — ED Triage Notes (Addendum)
 Pt arrives via POV with c/o rectal prolapse that is causing abd pain that they have been dealing with for 6 years. Pt states that it has gotten worse over the last 3 days, and they are also dealing abd pain, which a hx of celiac disease but carbs are their comfort food. Pt also has a hx of Ehlers-Danlos syndrome and was told that they need to come here for a CT scan to make sure nothing have ruptured. Pt is A&Ox4 and ambulatory in triage.

## 2024-07-24 NOTE — ED Notes (Signed)
 Intentionally rounding on patient and patient's mood labile. Conversation ranged from pleasant and light to tense and tearful. Speech tangential and patient expressing emotional needs and therapeutic communication provided and patient reassured. Pt assisted with removing sweatshirt upon request. Pt continues to report rectal pain rated 8/10. Call bell at bedside. Denies further needs at this time.

## 2024-07-24 NOTE — Telephone Encounter (Signed)
 Patient called the nurse triage line inquiring about a referral that was placed three years ago by a provider who is no longer with our practice. I informed her of the specialist the referral was originally sent to and advised her to follow up with her primary care provider or current OB/GYN, as she is no longer an active patient at our office.

## 2024-08-15 DIAGNOSIS — K641 Second degree hemorrhoids: Secondary | ICD-10-CM | POA: Diagnosis not present

## 2024-08-16 DIAGNOSIS — G909 Disorder of the autonomic nervous system, unspecified: Secondary | ICD-10-CM | POA: Diagnosis not present

## 2024-08-16 DIAGNOSIS — Q796 Ehlers-Danlos syndrome, unspecified: Secondary | ICD-10-CM | POA: Diagnosis not present

## 2024-08-16 DIAGNOSIS — M797 Fibromyalgia: Secondary | ICD-10-CM | POA: Diagnosis not present

## 2024-08-16 DIAGNOSIS — R2689 Other abnormalities of gait and mobility: Secondary | ICD-10-CM | POA: Diagnosis not present

## 2024-08-16 DIAGNOSIS — K900 Celiac disease: Secondary | ICD-10-CM | POA: Diagnosis not present

## 2024-08-17 DIAGNOSIS — K649 Unspecified hemorrhoids: Secondary | ICD-10-CM | POA: Diagnosis not present

## 2024-08-19 ENCOUNTER — Encounter: Payer: Self-pay | Admitting: Nurse Practitioner

## 2024-08-22 ENCOUNTER — Encounter: Payer: Self-pay | Admitting: Nurse Practitioner

## 2024-08-23 ENCOUNTER — Telehealth: Payer: Self-pay | Admitting: Family Medicine

## 2024-08-23 NOTE — Telephone Encounter (Signed)
 Patient called in regards to her daughter and asked if Dr. Joane is able to diagnose for EDS and hypermobility. She is 43 years old. Other doctors are telling her that she needs to go to a geneticist to get a diagnosis for her daughter due to her age. She is trying to get her daughter back into physical therapy and she is not able to do that. Patient asked that Dr. Joane send a mychart for documentation. She would like to know if there is age restriction on EDS diagnosis as that is what she has been told. She would like to get her into a few different types of therapy. She also wants to ask that if she can be seen, if it is okay that her daughter has [employer] for insurance. Please advise.

## 2024-08-23 NOTE — Telephone Encounter (Signed)
 Forwarding to Dr. Denyse Amass to review and advise.

## 2024-08-23 NOTE — Progress Notes (Signed)
 Stacy West                                          MRN: [id number]   08/23/2024   The [facility] Quality Team Specialist reviewed this patient medical record for the purposes of chart review for care gap closure. The following were reviewed: chart review for care gap closure-glycemic status assessment.    [facility] Quality Team

## 2024-08-24 NOTE — Telephone Encounter (Signed)
 Happy to see her daughter.  Generally at that age we can make a diagnosis for hypermobility but hypermobile Ehlers-Danlos is more challenging.  We can do genetic testing here or in conjunction with a geneticist.  Genna to schedule an appointment.

## 2024-08-27 DIAGNOSIS — M9915 Subluxation complex (vertebral) of pelvic region: Secondary | ICD-10-CM | POA: Diagnosis not present

## 2024-08-27 DIAGNOSIS — M9913 Subluxation complex (vertebral) of lumbar region: Secondary | ICD-10-CM | POA: Diagnosis not present

## 2024-08-27 DIAGNOSIS — M9914 Subluxation complex (vertebral) of sacral region: Secondary | ICD-10-CM | POA: Diagnosis not present

## 2024-08-29 NOTE — Progress Notes (Signed)
 Etrulia Zarr                                          MRN: [id number]   08/29/2024   The [facility]BC[facility] Quality Team Specialist reviewed this patient medical record for the purposes of chart review for care gap closure. The following were reviewed: chart review for care gap closure-diabetic eye exam and kidney health evaluation for diabetes:eGFR  and uACR.    [facility] Quality Team

## 2024-08-31 ENCOUNTER — Encounter: Payer: Self-pay | Admitting: Nurse Practitioner

## 2024-08-31 DIAGNOSIS — M51379 Other intervertebral disc degeneration, lumbosacral region without mention of lumbar back pain or lower extremity pain: Secondary | ICD-10-CM

## 2024-08-31 DIAGNOSIS — Q7962 Hypermobile Ehlers-Danlos syndrome: Secondary | ICD-10-CM

## 2024-09-05 ENCOUNTER — Encounter: Payer: Self-pay | Admitting: Nurse Practitioner

## 2024-09-06 DIAGNOSIS — M9913 Subluxation complex (vertebral) of lumbar region: Secondary | ICD-10-CM | POA: Diagnosis not present

## 2024-09-06 DIAGNOSIS — M9915 Subluxation complex (vertebral) of pelvic region: Secondary | ICD-10-CM | POA: Diagnosis not present

## 2024-09-06 DIAGNOSIS — M9914 Subluxation complex (vertebral) of sacral region: Secondary | ICD-10-CM | POA: Diagnosis not present

## 2024-09-11 DIAGNOSIS — M9914 Subluxation complex (vertebral) of sacral region: Secondary | ICD-10-CM | POA: Diagnosis not present

## 2024-09-11 DIAGNOSIS — M9915 Subluxation complex (vertebral) of pelvic region: Secondary | ICD-10-CM | POA: Diagnosis not present

## 2024-09-11 DIAGNOSIS — M9913 Subluxation complex (vertebral) of lumbar region: Secondary | ICD-10-CM | POA: Diagnosis not present

## 2024-09-14 ENCOUNTER — Telehealth: Payer: Self-pay | Admitting: Nurse Practitioner

## 2024-09-14 DIAGNOSIS — M6289 Other specified disorders of muscle: Secondary | ICD-10-CM

## 2024-09-14 DIAGNOSIS — Q7962 Hypermobile Ehlers-Danlos syndrome: Secondary | ICD-10-CM

## 2024-09-18 NOTE — Telephone Encounter (Signed)
 I am not seeing a prior valid referral in the system. Can you assist. Thank you!

## 2024-10-05 ENCOUNTER — Telehealth: Payer: Self-pay | Admitting: Nurse Practitioner

## 2024-10-05 ENCOUNTER — Encounter: Payer: Self-pay | Admitting: Nurse Practitioner

## 2024-10-05 NOTE — Telephone Encounter (Signed)
Hi,   Are you able to assist with this?

## 2024-10-09 NOTE — Progress Notes (Signed)
 Stacy West                                          MRN: [id number]   10/09/2024   The [facility]BCI Quality Team Specialist reviewed this patient medical record for the purposes of chart review for care gap closure. The following were reviewed: chart review for care gap closure-glycemic status assessment and kidney health evaluation for diabetes:eGFR  and uACR.    [facility]BCI Quality Team

## 2024-10-12 ENCOUNTER — Encounter: Payer: Self-pay | Admitting: Family Medicine

## 2024-10-15 NOTE — Telephone Encounter (Signed)
 Forwarding to Dr. Denyse Amass to review and advise.

## 2024-10-17 NOTE — Progress Notes (Unsigned)
 [facility] [address] [place] [address] Phone: [phone]   Assessment and Plan:     1.  Chronic left ankle pain (Primary) 2. Sprain of anterior talofibular ligament of left ankle, initial encounter -Chronic with exacerbation, initial sports medicine visit - Left foot and ankle pain, primarily along dorsum of hindfoot, midfoot, ankle joint flared after inversion ankle sprain occurring 1 week ago - Office note from [facility] visit on 10/14/2024 states suspected flare of low back pain and inversion ankle sprain.  I agree with his assessment.  Patient brought in printed imaging from her x-rays at [facility].  While this is not the optimal view of x-ray imaging, I did not see any fracture or dislocation on AP or lateral view.  Was able to view os lateral to fibula, potentially representing remote healed trauma versus congenital os, as well as os navicular.  Reassured patient that these do not represent acute fracture or trauma - Will order MRI with history of frequent ankle sprain, no fracture seen on x-ray imaging, pain with ambulation - Start meloxicam 15 mg daily x2 weeks.  If still having pain after 2 weeks, complete 3rd-week of NSAID. May use remaining NSAID as needed once daily for pain control.  Do not to use additional over-the-counter NSAIDs (ibuprofen, naproxen, Advil, Aleve, etc.) while taking prescription NSAIDs.  May use Tylenol  [phone] mg 2 to 3 times a day for breakthrough pain.  -Start HEP for ankle - Recommend using boot with goal of pain-free ambulation  3. Chronic bilateral low back pain without sciatica -Chronic with exacerbation, initial visit - Patient states she has had low back pain for years with new left-sided radicular symptoms since spraining her ankle 1 week ago - Patient brought in printed x-rays from [facility].  While this is not optimal viewing of imaging, I do not see any significant degenerative  change, vertebral collapse, fracture, or other pathology - With chronic low back pain with left-sided radicular symptoms will order MRI of lumbar spine - Start meloxicam 15 mg daily x2 weeks.  If still having pain after 2 weeks, complete 3rd-week of NSAID. May use remaining NSAID as needed once daily for pain control.  Do not to use additional over-the-counter NSAIDs (ibuprofen, naproxen, Advil, Aleve, etc.) while taking prescription NSAIDs.  May use Tylenol  [phone] mg 2 to 3 times a day for breakthrough pain.  -Start HEP for low back    Pertinent previous records reviewed include [facility] printed x-rays and office note from 10/14/2024   Follow Up: 6 weeks for reevaluation.  If MRIs are denied, could order MRIs after completing 6 weeks of home exercise program, meloxicam course, relative rest.   Subjective:   I, [name] am a scribe for Dr. Leonce.   Chief Complaint: ankle pain   HPI:   10/18/2024 Patient is a 43 year old female with ankle pain. Patient states she rolled her ankle. Stepping down from a porch and stepped into a hole, rolled ankle, saw stars. Next day threw up from the pain. Went to [facility] and they stated she aggravated previous injuries in the ankle. The back pain radiates down her butt and across her hips. It has been an issue for years. Been popping SI joint back in with the PT technique that she was shown. Haven't been able to sleep. Tried tylenol  and ibuprofen.  Duration?last Thursday the week before thanksgiving Did you have an Injury to cause this pain?yes Taking Medication for  pain? Numbness or Tingling? yes Does the pain Radiate? Yes up and down leg to the bottom of the heel Altered gait or use? yes ROM/ impairment of movement? Yes    Relevant Historical Information: EDS, SI joint issues, multiple fusions in the neck, micro valve malfunction   Additional pertinent review of systems negative.   Current Outpatient Medications:    cephALEXin   (KEFLEX ) 500 MG capsule, Take 1 capsule (500 mg total) by mouth 3 (three) times daily., Disp: 21 capsule, Rfl: 0   cyanocobalamin  (VITAMIN B12) 1000 MCG/ML injection, Inject 1 mL (1,000 mcg total) into the muscle every 30 (thirty) days., Disp: 30 mL, Rfl: 2   dicyclomine  (BENTYL ) 20 MG tablet, Take 1 tablet (20 mg total) by mouth every 6 (six) hours as needed for up to 5 days for spasms., Disp: 20 tablet, Rfl: 0   estradiol  (CLIMARA  - DOSED IN MG/24 HR) 0.1 mg/24hr patch, Place 1 patch onto the skin., Disp: , Rfl:    ibuprofen (ADVIL) 200 MG tablet, Take 200 mg by mouth as needed., Disp: , Rfl:    levocetirizine (XYZAL ) 5 MG tablet, TAKE 1 TABLET BY MOUTH EVERY DAY IN THE EVENING (Patient not taking: Reported on 07/10/2024), Disp: 90 tablet, Rfl: 1   Levomefolate Glucosamine (METHYL-FOLATE PO), Take by mouth. (Patient not taking: Reported on 07/10/2024), Disp: , Rfl:    medroxyPROGESTERone  (PROVERA ) 10 MG tablet, Take 10 mg by mouth daily. Per patient takes for 10 days then off for 2 weeks then repeats cycle, Disp: , Rfl:    metFORMIN  (GLUCOPHAGE -XR) 500 MG 24 hr tablet, Take 4 tablets by mouth at bedtime. (Patient not taking: Reported on 07/10/2024), Disp: , Rfl:    midodrine  (PROAMATINE ) 2.5 MG tablet, Take 1 tablet (2.5 mg total) by mouth 2 (two) times daily with a meal., Disp: 60 tablet, Rfl: 2   montelukast  (SINGULAIR ) 10 MG tablet, TAKE 1 TABLET BY MOUTH EVERYDAY AT BEDTIME (Patient not taking: Reported on 07/10/2024), Disp: 90 tablet, Rfl: 0   Omega-3 1000 MG CAPS, Take 1 capsule by mouth every morning. (Patient not taking: Reported on 07/10/2024), Disp: , Rfl:    pantoprazole  (PROTONIX ) 40 MG tablet, TAKE 1 TABLET BY MOUTH EVERY DAY (Patient not taking: Reported on 07/10/2024), Disp: 90 tablet, Rfl: 3   pregabalin  (LYRICA ) 50 MG capsule, Take 1 capsule (50 mg total) by mouth at bedtime. (Patient not taking: Reported on 07/10/2024), Disp: 90 capsule, Rfl: 2   Prenatal Vit-Fe Fumarate-FA (PRENATAL  MULTIVITAMIN) TABS tablet, Take 1 tablet by mouth daily at 12 noon., Disp: , Rfl:    progesterone  (PROMETRIUM ) 100 MG capsule, Take 100 mg by mouth daily., Disp: , Rfl:    scopolamine  (TRANSDERM-SCOP) 1 MG/3DAYS, Place 1 patch (1 mg total) onto the skin every 3 (three) days., Disp: 10 patch, Rfl: 0   SYRINGE-NEEDLE, DISP, 3 ML (B-D 3CC LUER-LOK SYR 20GX1) 20G X 1 3 ML MISC, Use to inject B12 monthly intramuscular., Disp: 50 each, Rfl: 4   trazodone  (DESYREL ) 300 MG tablet, Take 300 mg by mouth at bedtime. (Patient not taking: Reported on 07/10/2024), Disp: , Rfl:    Objective:     Vitals:   10/18/24 1500  BP: 122/60  Pulse: 84  SpO2: 98%  Height: 5' 11 (1.803 m)      Body mass index is 32.08 kg/m.    Physical Exam:    Gen: Appears well, nad, nontoxic and pleasant Psych: Alert and oriented, appropriate mood and affect Neuro: sensation intact, strength  is 5/5 with df/pf/inv/ev, muscle tone wnl Skin: no susupicious lesions or rashes  Left foot/ankle:  No deformity, no swelling or effusion Generalized tenderness ROM DF 30, PF 45, inv/ev intact Patient using wheelchair due to pain with ambulation   Electronically signed by:  Odis Mace D.[facility]MENT[facility] 4:02 PM 10/18/24

## 2024-10-17 NOTE — Telephone Encounter (Signed)
Forwarding to Dr. Corey to review.  

## 2024-10-18 ENCOUNTER — Ambulatory Visit: Admitting: Sports Medicine

## 2024-10-18 VITALS — BP 122/60 | HR 84 | Ht 71.0 in

## 2024-10-18 DIAGNOSIS — S93492A Sprain of other ligament of left ankle, initial encounter: Secondary | ICD-10-CM

## 2024-10-18 DIAGNOSIS — M25572 Pain in left ankle and joints of left foot: Secondary | ICD-10-CM | POA: Diagnosis not present

## 2024-10-18 DIAGNOSIS — G8929 Other chronic pain: Secondary | ICD-10-CM | POA: Diagnosis not present

## 2024-10-18 DIAGNOSIS — M5442 Lumbago with sciatica, left side: Secondary | ICD-10-CM

## 2024-10-18 NOTE — Patient Instructions (Addendum)
 Low back and ankle HEP. Lumbar MRI and left ankle MRI to [facility]. Follow up in 6 weeks with me or Dr. Joane.

## 2024-10-19 ENCOUNTER — Encounter: Payer: Self-pay | Admitting: Sports Medicine

## 2024-10-22 ENCOUNTER — Other Ambulatory Visit: Payer: Self-pay | Admitting: Sports Medicine

## 2024-10-22 MED ORDER — MELOXICAM 15 MG PO TABS: ORAL_TABLET | ORAL | 0 refills | Status: AC

## 2024-10-22 NOTE — Progress Notes (Signed)
Meloxicam placed

## 2024-10-24 ENCOUNTER — Ambulatory Visit: Admitting: Family Medicine

## 2024-10-25 ENCOUNTER — Ambulatory Visit: Admitting: Family Medicine

## 2024-10-31 ENCOUNTER — Inpatient Hospital Stay: Admission: RE | Admit: 2024-10-31 | Discharge: 2024-10-31 | Attending: Sports Medicine

## 2024-10-31 ENCOUNTER — Ambulatory Visit
Admission: RE | Admit: 2024-10-31 | Discharge: 2024-10-31 | Disposition: A | Discharge status: Home / Self Care | Source: Ambulatory Visit | Attending: Sports Medicine | Admitting: Sports Medicine

## 2024-10-31 DIAGNOSIS — G8929 Other chronic pain: Secondary | ICD-10-CM

## 2024-10-31 DIAGNOSIS — S93492A Sprain of other ligament of left ankle, initial encounter: Secondary | ICD-10-CM

## 2024-10-31 NOTE — Progress Notes (Signed)
 Stacy West                                          MRN: [id number]   10/31/2024   The VBCI Quality Team Specialist reviewed this patient medical record for the purposes of chart review for care gap closure. The following were reviewed: chart review for care gap closure-glycemic status assessment and kidney health evaluation for diabetes:eGFR  and uACR.    VBCI Quality Team

## 2024-11-04 ENCOUNTER — Encounter: Payer: Self-pay | Admitting: Sports Medicine

## 2024-11-05 ENCOUNTER — Ambulatory Visit: Payer: Self-pay | Admitting: Sports Medicine

## 2024-11-19 NOTE — Progress Notes (Signed)
 Alonia Dibuono                                          MRN: [id number]   11/19/2024   The VBCI Quality Team Specialist reviewed this patient medical record for the purposes of chart review for care gap closure. The following were reviewed: chart review for care gap closure-glycemic status assessment.    VBCI Quality Team

## 2024-11-20 ENCOUNTER — Ambulatory Visit: Payer: Self-pay

## 2024-11-20 ENCOUNTER — Telehealth: Admitting: Nurse Practitioner

## 2024-11-20 ENCOUNTER — Encounter: Payer: Self-pay | Admitting: Nurse Practitioner

## 2024-11-20 ENCOUNTER — Telehealth: Admitting: Family Medicine

## 2024-11-20 VITALS — Temp 97.4°F | Ht 71.0 in | Wt 230.0 lb

## 2024-11-20 DIAGNOSIS — M4134 Thoracogenic scoliosis, thoracic region: Secondary | ICD-10-CM

## 2024-11-20 DIAGNOSIS — Z91199 Patient's noncompliance with other medical treatment and regimen due to unspecified reason: Secondary | ICD-10-CM

## 2024-11-20 NOTE — Progress Notes (Signed)
 The patient no-showed for appointment despite this provider sending direct link, reaching out via phone with no response and waiting for at least 10 minutes from appointment time for patient to join. They will be marked as a NS for this appointment/time.   Freddy Finner, NP

## 2024-11-20 NOTE — Telephone Encounter (Signed)
 FYI Only or Action Required?: Action required by provider: clinical question for provider and update on patient condition.  Patient was last seen in primary care on 11/20/2024 by Stacy Chiquita HERO, NP.  Called Nurse Triage reporting Migraine.  Symptoms began several weeks ago.  Interventions attempted: OTC medications: flonase, afrin, decongestant, Prescription medications: mobic , Rest, hydration, or home remedies, and Ice/heat application.  Symptoms are: rapidly worsening.  Triage Disposition: See HCP Within 4 Hours (Or PCP Triage)  Patient/caregiver understands and will follow disposition?: No, wishes to speak with PCP    Copied from CRM #[id number]. Topic: Clinical - Red Word Triage >> Nov 20, 2024  9:56 AM DeAngela L wrote: Red Word that prompted transfer to Nurse Triage: patient has been sick 3 week respiratory infection and she has been showing signs of infection in the last 3 days, migraines, back pain from coughing and sneezing all through her head and neck she has infusion  a plate and 2 rods on the back of her head and down her neck the head pressure from the sinus pressure is making it worse and she can't hold her head up or eat, nausea   Pt num  [id number]    Reason for Disposition  [1] SEVERE headache (e.g., excruciating) AND [2] not improved after 2 hours of pain medicine  Answer Assessment - Initial Assessment Questions Pt connected to NT after virtual telehealth appt was disconnected. Pt attempted to call the virtual provider back d/t loss connection but PAS was unable to reconnect and pt was sent back to NT. Pt did not no show appt; was unable to connect to provider. Pt reported not feeling well x 3 wks then in the last 3 days pt started experiencing migraines, back pain with coughing and sneezing. Pt reports occurrence of blood tinged mucous with sneezing and coughing. Pt reports she has hypermobility ehler-danlos syndrome so she has been unable to go to physical therapy  or her chiropractor resulting in her not being able to hold her head up. Pt reports she has not left her bed in over 24h. Pt has been taking meloxicam , afrin and flonase nasal spray, decongestants, using heating pads and states she increased her caffeine intake as it has helped before. Discussed d/t complexity of symptoms pt should seek medical attention in person and virtual appt may not be appropriate. Pt voiced frustration as the hospital never helps her and she has tried to wait out symptoms to avoid going to the hospital; pt states she will get sicker if she goes to hospital. Discussed no additional virtual UC appts until 7pm today and pt became tearful and inconsolable that she could not wait until 7pm today and needed help now. Pt requested amoxicillin  for symptom relief. Reassured her I would send everything to PCP to make her aware of situation.     1. LOCATION: Where does it hurt?      Back of head and neck; it just hurts so bad and every time I cough it adds more pressure   2. ONSET: When did the headache start? (e.g., minutes, hours, days)      Worsened over the last 3 days   3. PATTERN: Does the pain come and go, or has it been constant since it started?     Constant   4. SEVERITY: How bad is the pain? and What does it keep you from doing?  (e.g., Scale 1-10; mild, moderate, or severe)     Severe   5. RECURRENT SYMPTOM: Have  you ever had headaches before? If Yes, ask: When was the last time? and What happened that time?      Yes; Pt reports having hypermobility ehlers-danlos syndrome and with all the symptoms she has been unable to go to physical therapy or her chiropractor.   6. CAUSE: What do you think is causing the headache?     Pressure from respiratory infection on rods and plate   7. MIGRAINE: Have you been diagnosed with migraine headaches? If Yes, ask: Is this headache similar?      No   8. HEAD INJURY: Has there been any recent injury to your  head?      No   9. OTHER SYMPTOMS: Do you have any other symptoms? (e.g., fever, stiff neck, eye pain, sore throat, cold symptoms)     Stiffness, respiratory symptoms of cough and sneezing, nausea  Protocols used: Headache-A-AH

## 2024-11-21 ENCOUNTER — Encounter: Payer: Self-pay | Admitting: Nurse Practitioner

## 2024-11-21 ENCOUNTER — Telehealth: Admitting: Nurse Practitioner

## 2024-11-21 VITALS — Temp 97.3°F | Wt 234.0 lb

## 2024-11-21 DIAGNOSIS — J0110 Acute frontal sinusitis, unspecified: Secondary | ICD-10-CM | POA: Diagnosis not present

## 2024-11-21 DIAGNOSIS — J321 Chronic frontal sinusitis: Secondary | ICD-10-CM | POA: Insufficient documentation

## 2024-11-21 MED ORDER — AMOXICILLIN-POT CLAVULANATE 875-125 MG PO TABS: 1.0000 | ORAL_TABLET | Freq: Two times a day (BID) | ORAL | 0 refills | Status: AC

## 2024-11-21 MED ORDER — PREDNISONE 10 MG PO TABS: ORAL_TABLET | ORAL | 0 refills | Status: AC

## 2024-11-21 NOTE — Progress Notes (Signed)
 Error

## 2024-11-21 NOTE — Telephone Encounter (Signed)
 Per [facility]'s note  The patient no-showed for appointment despite this provider sending direct link, reaching out via phone with no response and waiting for at least 10 minutes from appointment time for patient to join. They will be marked as a NS for this appointment/time.    Chiquita CHRISTELLA Barefoot, NP

## 2024-11-21 NOTE — Patient Instructions (Signed)

## 2024-11-21 NOTE — Assessment & Plan Note (Signed)
 Acute for 3 weeks, no benefit from OTC medications. Will start Augmentin  BID and steroid taper, both which have offered benefit and been tolerated in the past. Continue at home allergy  medication regimen. If recurrence may need to get back into ENT. Recommend: - Increased rest - Increasing Fluids - Acetaminophen  / ibuprofen as needed for fever/pain.  - Salt water gargling, chloraseptic spray and throat lozenges - Mucinex.  - Saline sinus flushes or a neti pot.  - Humidifying the air.

## 2024-11-21 NOTE — Progress Notes (Signed)
 "  Temp (!) 97.3 F (36.3 C) (Oral)   Wt 234 lb (106.1 kg)   LMP 11/07/2024 (Approximate)   BMI 32.64 kg/m    Subjective:    Patient ID: Stacy West, female    DOB: 1981-09-27, 44 y.o.   MRN: [id number]  HPI: Stacy West is a 44 y.o. female  Chief Complaint  Patient presents with   Sinus Problem    Sinus infection that is causing a headache and hurts when she is coughing. Pain all the way around the back of her to the front of head. Feels like its a hot iron all over her head. Sound and light is hurting here. Pain leads down to neck making it feel heavy. Problems with swallowing and a diarreha. Coughing sneezing green stuff, nasal cavity coming out and coughing up a little bit of blood. Medications not helping.   Virtual Visit via Video Note  I connected with Stacy West on 11/21/2024 at  2:00 PM EST by a video enabled telemedicine application and verified that I am speaking with the correct person using two identifiers.  Location: Patient: home Provider: work   I discussed the limitations of evaluation and management by telemedicine and the availability of in person appointments. The patient expressed understanding and agreed to proceed.  I discussed the assessment and treatment plan with the patient. The patient was provided an opportunity to ask questions and all were answered. The patient agreed with the plan and demonstrated an understanding of the instructions.   The patient was advised to call back or seek an in-person evaluation if the symptoms worsen or if the condition fails to improve as anticipated.  I provided 25 minutes of non-face-to-face time during this encounter.   Angelyse Heslin T Alvaro Aungst, NP   UPPER RESPIRATORY TRACT INFECTION Has had sinus infection now for 3 weeks. Tried all the OTC medications without benefit. At baseline has sinus issues and takes OTC allergy  medications. Has seen ENT in the past. Tolerates steroids without reaction. Pressure is hurting down  into her neck. Fever: no Cough: yes Shortness of breath: no Wheezing: no Chest pain: no Chest tightness: yes Chest congestion: yes Nasal congestion: yes Runny nose: yes Post nasal drip: yes Sneezing: no Sore throat: yes Swollen glands: no Sinus pressure: yes Headache: yes Face pain: yes Toothache: no Ear pain: yes bilateral Ear pressure: yes bilateral Eyes red/itching:yes Eye drainage/crusting: no  Vomiting: yes Rash: no Fatigue: yes Sick contacts: yes Strep contacts: no  Context: worse Recurrent sinusitis: no Relief with OTC cold/cough medications: no  Treatments attempted: cold/sinus, mucinex, and anti-histamine    Relevant past medical, surgical, family and social history reviewed and updated as indicated. Interim medical history since our last visit reviewed. Allergies and medications reviewed and updated.  Review of Systems  Constitutional:  Positive for fatigue. Negative for activity change, appetite change, chills and fever.  HENT:  Positive for congestion, ear pain, postnasal drip, rhinorrhea, sinus pressure, sinus pain and sore throat. Negative for ear discharge.   Respiratory:  Positive for cough and chest tightness. Negative for shortness of breath and wheezing.   Cardiovascular:  Negative for chest pain, palpitations and leg swelling.  Gastrointestinal:  Positive for nausea.  Neurological:  Positive for headaches.  Psychiatric/Behavioral: Negative.      Per HPI unless specifically indicated above     Objective:    Temp (!) 97.3 F (36.3 C) (Oral)   Wt 234 lb (106.1 kg)   LMP 11/07/2024 (Approximate)   BMI 32.64  kg/m   Wt Readings from Last 3 Encounters:  11/21/24 234 lb (106.1 kg)  11/20/24 230 lb (104.3 kg)  07/24/24 230 lb (104.3 kg)    Physical Exam Vitals and nursing note reviewed.  Constitutional:      General: She is awake. She is not in acute distress.    Appearance: She is well-developed and well-groomed. She is ill-appearing. She  is not toxic-appearing.  HENT:     Head: Normocephalic.     Right Ear: Hearing normal.     Left Ear: Hearing normal.  Eyes:     General: Lids are normal.        Right eye: No discharge.        Left eye: No discharge.     Conjunctiva/sclera: Conjunctivae normal.  Pulmonary:     Effort: Pulmonary effort is normal. No accessory muscle usage or respiratory distress.     Comments: No SOB with talking. Musculoskeletal:     Cervical back: Normal range of motion.  Neurological:     Mental Status: She is alert and oriented to person, place, and time.  Psychiatric:        Attention and Perception: Attention normal.        Mood and Affect: Mood normal.        Behavior: Behavior normal. Behavior is cooperative.        Thought Content: Thought content normal.        Judgment: Judgment normal.     Results for orders placed or performed during the hospital encounter of 07/24/24  Lipase, blood   Collection Time: 07/24/24  3:35 PM  Result Value Ref Range   Lipase 37 11 - 51 U/L  Comprehensive metabolic panel   Collection Time: 07/24/24  3:35 PM  Result Value Ref Range   Sodium 140 135 - 145 mmol/L   Potassium 4.0 3.5 - 5.1 mmol/L   Chloride 111 98 - 111 mmol/L   CO2 22 22 - 32 mmol/L   Glucose, Bld 83 70 - 99 mg/dL   BUN 12 6 - 20 mg/dL   Creatinine, Ser 9.29 0.44 - 1.00 mg/dL   Calcium 9.2 8.9 - 89.6 mg/dL   Total Protein 7.3 6.5 - 8.1 g/dL   Albumin 4.2 3.5 - 5.0 g/dL   AST 15 15 - 41 U/L   ALT 12 0 - 44 U/L   Alkaline Phosphatase 58 38 - 126 U/L   Total Bilirubin 0.6 0.0 - 1.2 mg/dL   GFR, Estimated >39 >39 mL/min   Anion gap 7 5 - 15  CBC   Collection Time: 07/24/24  3:35 PM  Result Value Ref Range   WBC 9.5 4.0 - 10.5 K/uL   RBC 4.56 3.87 - 5.11 MIL/uL   Hemoglobin 14.3 12.0 - 15.0 g/dL   HCT 57.7 63.9 - 53.9 %   MCV 92.5 80.0 - 100.0 fL   MCH 31.4 26.0 - 34.0 pg   MCHC 33.9 30.0 - 36.0 g/dL   RDW 87.0 88.4 - 84.4 %   Platelets 342 150 - 400 K/uL   nRBC 0.0 0.0 - 0.2  %  hCG, quantitative, pregnancy   Collection Time: 07/24/24  3:35 PM  Result Value Ref Range   hCG, Beta Chain, Quant, S <1 <5 mIU/mL  Urinalysis, Routine w reflex microscopic -Urine, Clean Catch   Collection Time: 07/24/24  4:58 PM  Result Value Ref Range   Color, Urine YELLOW (A) YELLOW   APPearance HAZY (A) CLEAR  Specific Gravity, Urine 1.027 1.005 - 1.030   pH 5.0 5.0 - 8.0   Glucose, UA NEGATIVE NEGATIVE mg/dL   Hgb urine dipstick NEGATIVE NEGATIVE   Bilirubin Urine NEGATIVE NEGATIVE   Ketones, ur NEGATIVE NEGATIVE mg/dL   Protein, ur NEGATIVE NEGATIVE mg/dL   Nitrite NEGATIVE NEGATIVE   Leukocytes,Ua NEGATIVE NEGATIVE      Assessment & Plan:   Problem List Items Addressed This Visit       Respiratory   Frontal sinusitis - Primary   Acute for 3 weeks, no benefit from OTC medications. Will start Augmentin  BID and steroid taper, both which have offered benefit and been tolerated in the past. Continue at home allergy  medication regimen. If recurrence may need to get back into ENT. Recommend: - Increased rest - Increasing Fluids - Acetaminophen  / ibuprofen as needed for fever/pain.  - Salt water gargling, chloraseptic spray and throat lozenges - Mucinex.  - Saline sinus flushes or a neti pot.  - Humidifying the air.       Relevant Medications   oxymetazoline (AFRIN) 0.05 % nasal spray   diphenhydrAMINE  (BENADRYL ) 50 MG capsule   predniSONE  (DELTASONE ) 10 MG tablet   amoxicillin -clavulanate (AUGMENTIN ) 875-125 MG tablet     Follow up plan: Return if symptoms worsen or fail to improve.      "

## 2024-11-29 ENCOUNTER — Encounter: Payer: Self-pay | Admitting: Family Medicine

## 2024-11-29 ENCOUNTER — Ambulatory Visit: Admitting: Family Medicine

## 2024-11-29 VITALS — BP 102/80 | HR 73 | Ht 71.0 in | Wt 247.0 lb

## 2024-11-29 DIAGNOSIS — Q7962 Hypermobile Ehlers-Danlos syndrome: Secondary | ICD-10-CM | POA: Diagnosis not present

## 2024-11-29 DIAGNOSIS — M797 Fibromyalgia: Secondary | ICD-10-CM

## 2024-11-29 DIAGNOSIS — M5442 Lumbago with sciatica, left side: Secondary | ICD-10-CM

## 2024-11-29 DIAGNOSIS — G8929 Other chronic pain: Secondary | ICD-10-CM

## 2024-11-29 NOTE — Patient Instructions (Addendum)
 Thank you for coming in today.   Referral placed to [facility] in [place].   See you back in 3 months.

## 2024-11-29 NOTE — Progress Notes (Signed)
 "        I, Leotis Batter, CMA acting as a scribe for Artist Lloyd, MD.  Stacy West is a 44 y.o. female who presents to [facility] today for f/u low back and L ankle pain w/ MRI review. Pt was last seen by Dr. Leonce on 10/18/24 and was prescribed meloxicam  and MRI's were ordered.  Today, pt reports continued lower back pain and n/t. C/o radicular pain after PT radiating into the hips and groin. Has decreased sensation in the vulvar region. Notes weakness in the legs. Stabbing neve pain when laying down. The hips continue to sublux. Sleeps with a pillow between the legs. Feels a little better after the pop sometimes. Has done ESI injection in the past as well as treatment with a chiropractor, neither providing long-term relief.   Dominant problem is low back pain with pain radiating down her left leg and pain radiating into the anterior groin bilaterally.  As noted above she has had trials of epidural steroid injections several years ago in [place]  which did not help and is currently engaged in physical therapy which is not helpful.  She is reluctant to consider further injections as she has viewed them to be not helpful in the past.   Dx testing: 10/31/24 L ankle & L-spine MRI  Pertinent review of systems: No fevers or chills  Relevant historical information: Ehlers-Danlos syndrome.  Protein S deficiency.   Exam:  BP 102/80   Pulse 73   Ht 5' 11 (1.803 m)   Wt 247 lb (112 kg)   LMP 11/07/2024 (Approximate)   SpO2 98%   BMI 34.45 kg/m  General: Well Developed, well nourished, and in no acute distress.   MSK: L-spine normal motion.  Lower EXTR strength is intact.    Lab and Radiology Results  EXAM: MRI LUMBAR SPINE WITHOUT CONTRAST   TECHNIQUE: Multiplanar, multisequence MR imaging of the lumbar spine was performed. No intravenous contrast was administered.   COMPARISON:  None available.   FINDINGS: Segmentation: Standard. Lowest well-formed  disc space labeled the L5-S1 level.   Alignment: Physiologic with preservation of the normal lumbar lordosis. No listhesis.   Vertebrae: Vertebral body height maintained without acute or chronic fracture. Bone marrow signal intensity overall within normal limits. No worrisome osseous lesions. No abnormal marrow edema.   Conus medullaris and cauda equina: Conus extends to the L1-2 level. Conus and cauda equina appear normal.   Paraspinal and other soft tissues: Unremarkable.   Disc levels:   T11-12 seen only on sagittal projection. Disc desiccation with minimal disc bulge. Reactive endplate spurring. No stenosis.   T12-L1: Unremarkable.   L1-2:  Unremarkable.   L2-3:  Unremarkable.   L3-4: Mild disc desiccation with diffuse disc bulge. Mild bilateral facet spurring. No spinal stenosis. Foramina remain patent.   L4-5: Disc desiccation with mild disc bulge. Mild bilateral facet spurring. No spinal stenosis. Mild bilateral L4 foraminal narrowing.   L5-S1: Disc desiccation with mild disc bulge. Reactive endplate change with marginal endplate spurring. No spinal stenosis. Foramina remain patent.   IMPRESSION: 1. Mild degenerative disc bulging and facet spurring at L3-4 through L5-S1 without significant stenosis or neural impingement. 2. Mild bilateral L4 foraminal narrowing related to disc bulge and facet disease. No other significant foraminal encroachment within the lumbar spine.     Electronically Signed   By: Morene Hoard M.D.   On: 11/04/2024 03:45 I, Artist Lloyd, personally (independently) visualized and performed the interpretation  of the images attached in this note.  CT scan abdomen and pelvis images obtained September 2025 personally and independently interpreted.  No severe hip DJD or femoral acetabular impingement pattern visible.   Assessment and Plan: 44 y.o. female with low back pain with pain radiating down the left leg and pain into the anterior  groin.  This is concerning for lumbar radiculopathy.  Lumbar spine MRI does show the potential for L4 foraminal narrowing bilaterally which could be part of her pain.  She is already doing physical therapy and reluctant to consider further injections.  She would consider surgery.  Will go ahead and refer to neurosurgery.  I am not optimistic that she is a great surgical candidate but certainly worth some attention and thought.  For now continue physical therapy.  Pain radiating to the anterior hips is less likely to be lumbar radicular.  It is possible she has hip instability or hip labrum tear.  Plan to continue PT.  If following neurosurgery evaluation her symptoms not thought to be lumbar related could consider MRI arthrogram of whichever hip hurts the worst.  Anticipate recheck in about 3 months or sooner if needed. PDMP not reviewed this encounter. Orders Placed This Encounter  Procedures   Ambulatory referral to Neurosurgery    Referral Priority:   Routine    Referral Type:   Surgical    Referral Reason:   Specialty Services Required    Requested Specialty:   Neurosurgery    Number of Visits Requested:   1   No orders of the defined types were placed in this encounter.    Discussed warning signs or symptoms. Please see discharge instructions. Patient expresses understanding.   The above documentation has been reviewed and is accurate and complete Artist Lloyd, M.D. Total encounter time 30 minutes including face-to-face time with the patient and, reviewing past medical record, and charting on the date of service.     "

## 2024-11-30 ENCOUNTER — Encounter: Payer: Self-pay | Admitting: Family Medicine

## 2024-11-30 NOTE — Telephone Encounter (Signed)
 Forwarding to Dr. Denyse Amass to review and advise.

## 2024-12-13 ENCOUNTER — Encounter: Payer: Self-pay | Admitting: Nurse Practitioner

## 2024-12-13 NOTE — Progress Notes (Signed)
 Stacy West                                          MRN: [id number]   12/13/2024   The [facility] Quality Team Specialist reviewed this patient medical record for the purposes of chart review for care gap closure. The following were reviewed: chart review for care gap closure-glycemic status assessment.    [facility] Quality Team

## 2025-01-03 ENCOUNTER — Ambulatory Visit: Admitting: Orthopedic Surgery
